# Patient Record
Sex: Female | Born: 1945 | Race: White | Hispanic: No | Marital: Married | Smoking: Former smoker
Health system: Southern US, Community
[De-identification: ages and names within clinical notes are randomized; demographics above are authoritative.]

## PROBLEM LIST (undated history)

## (undated) DIAGNOSIS — N6459 Other signs and symptoms in breast: Secondary | ICD-10-CM

## (undated) DIAGNOSIS — I10 Essential (primary) hypertension: Secondary | ICD-10-CM

## (undated) DIAGNOSIS — C801 Malignant (primary) neoplasm, unspecified: Secondary | ICD-10-CM

## (undated) DIAGNOSIS — K219 Gastro-esophageal reflux disease without esophagitis: Secondary | ICD-10-CM

## (undated) DIAGNOSIS — T7840XA Allergy, unspecified, initial encounter: Secondary | ICD-10-CM

## (undated) DIAGNOSIS — E785 Hyperlipidemia, unspecified: Secondary | ICD-10-CM

## (undated) HISTORY — PX: COSMETIC SURGERY: SHX468

## (undated) HISTORY — PX: FOOT SURGERY: SHX648

## (undated) HISTORY — DX: Allergy, unspecified, initial encounter: T78.40XA

## (undated) HISTORY — DX: Hyperlipidemia, unspecified: E78.5

## (undated) HISTORY — DX: Gastro-esophageal reflux disease without esophagitis: K21.9

## (undated) HISTORY — DX: Other signs and symptoms in breast: N64.59

---

## 1987-12-05 HISTORY — PX: BREAST BIOPSY: SHX20

## 1989-12-04 HISTORY — PX: CEREBRAL ANEURYSM REPAIR: SHX164

## 1991-12-05 HISTORY — PX: BREAST BIOPSY: SHX20

## 2003-11-10 ENCOUNTER — Other Ambulatory Visit: Payer: Self-pay

## 2003-12-11 LAB — HM COLONOSCOPY: HM Colonoscopy: NORMAL

## 2004-04-05 ENCOUNTER — Encounter (INDEPENDENT_AMBULATORY_CARE_PROVIDER_SITE_OTHER): Payer: Self-pay | Admitting: Specialist

## 2004-04-05 ENCOUNTER — Ambulatory Visit (HOSPITAL_COMMUNITY): Admission: RE | Admit: 2004-04-05 | Discharge: 2004-04-05 | Payer: Self-pay | Admitting: Gastroenterology

## 2004-08-05 ENCOUNTER — Encounter: Admission: RE | Admit: 2004-08-05 | Discharge: 2004-08-05 | Payer: Self-pay | Admitting: Gastroenterology

## 2004-10-07 ENCOUNTER — Encounter (INDEPENDENT_AMBULATORY_CARE_PROVIDER_SITE_OTHER): Payer: Self-pay | Admitting: Specialist

## 2004-10-07 ENCOUNTER — Ambulatory Visit (HOSPITAL_COMMUNITY): Admission: RE | Admit: 2004-10-07 | Discharge: 2004-10-07 | Payer: Self-pay | Admitting: Gastroenterology

## 2004-12-04 HISTORY — PX: HAMMER TOE SURGERY: SHX385

## 2004-12-04 HISTORY — PX: UPPER GI ENDOSCOPY: SHX6162

## 2005-04-20 ENCOUNTER — Ambulatory Visit: Payer: Self-pay | Admitting: General Surgery

## 2006-05-08 ENCOUNTER — Ambulatory Visit: Payer: Self-pay | Admitting: General Surgery

## 2007-05-09 ENCOUNTER — Ambulatory Visit: Payer: Self-pay | Admitting: General Surgery

## 2008-05-12 ENCOUNTER — Ambulatory Visit: Payer: Self-pay | Admitting: General Surgery

## 2009-05-25 ENCOUNTER — Ambulatory Visit: Payer: Self-pay | Admitting: General Surgery

## 2009-11-18 ENCOUNTER — Ambulatory Visit: Payer: Self-pay | Admitting: General Practice

## 2010-07-12 ENCOUNTER — Ambulatory Visit: Payer: Self-pay | Admitting: General Surgery

## 2011-04-21 NOTE — Op Note (Signed)
NAMELAMYIA, Michele Fernandez                        ACCOUNT NO.:  [id number]   MEDICAL RECORD NO.:  [id number]                   PATIENT TYPE:  AMB   LOCATION:  ENDO                                 FACILITY:  [facility]   PHYSICIAN:  Petra Kuba, M.D.                 DATE OF BIRTH:  1945-12-23   DATE OF PROCEDURE:  DATE OF DISCHARGE:                                 OPERATIVE REPORT   PROCEDURE:  Esophagogastroduodenoscopy with biopsy.   INDICATIONS:  Atypical reflux.   Consent was signed after risks, benefits, methods, options thoroughly  discussed in the office.   MEDICINES USED:  Demerol 40, Versed 5.   PROCEDURE:  The video endoscope was inserted by direct vision.  The  esophagus was normal.  Maybe there was a tiny hiatal hernia.  The scope was  passed into the stomach and advanced through the antrum.  Pertinent for some  very minimal amount of antritis.  Through a normal pylorus into a normal  duodenal bulb and around the C loop to a normal second portion of the  duodenum.  The scope was withdrawn back into the bulb and a good look there  ruled out abnormalities in that location.  The scope was withdrawn back to  the stomach and retroflexed.  Angularis, cardia, and fundus were normal.  The lesser and greater curve were normal and retroflexed in a straight  visualization to the stomach did not reveal any additional findings.  We  went ahead and took one CLO biopsy of the antrum to rule out Helicobacter.  Air was suctioned.  The scope was slowly withdrawn.  Again, a good look at  the esophagus was normal.  We did go ahead and take a few distal esophageal  biopsies to rule out any microscopic reflux changes.  The scope was removed.  The patient tolerated the procedure well.  There was no obvious immediate  complications.   DIAGNOSIS:  1. Questionable tiny hiatal hernia, status post distal esophageal biopsy to     rule out microscopic reflux.  2. Minimal antritis, status post CLO  biopsy.  3. Otherwise normal esophagogastroduodenoscopy.   PLAN:  Will await pathology.  A trial of Nexium, since Protonix seemed to  make her worse.  Consider an ultrasound if her midepigastric pain continues.  Follow up p.r.n. or in 2-3 months to recheck symptoms and make sure no  further workup plans are needed.                                               Petra Kuba, M.D.    MEM/MEDQ  D:  04/05/2004  T:  04/05/2004  Job:  [id number]

## 2011-04-21 NOTE — Op Note (Signed)
NAMEMONTSERRATH, Michele Fernandez            ACCOUNT NO.:  [id number]   MEDICAL RECORD NO.:  [id number]          PATIENT TYPE:  AMB   LOCATION:  ENDO                         FACILITY:  [facility]   PHYSICIAN:  Petra Kuba, M.D.    DATE OF BIRTH:  December 14, 1945   DATE OF PROCEDURE:  10/07/2004  DATE OF DISCHARGE:                                 OPERATIVE REPORT   PROCEDURE:  Colonoscopy.   INDICATIONS FOR PROCEDURE:  Screening.  Consent was signed after risks,  benefits, methods, and options were thoroughly discussed in the office.   MEDICATIONS USED:  Demerol 70, Versed 8.   PROCEDURE:  Rectal inspection was pertinent for external hemorrhoids.  Digital exam was negative.  The video pediatric adjustable colonoscope was  inserted and with minimal difficulty due to some tortuosity, with abdominal  pressure, was able to advance to the cecum.  No abnormalities were seen on  insertion.  We did have to roll her on her back, as well.  The cecum was  identified by the appendiceal orifice and the ileocecal valve.  The prep was  adequate, there was some liquid stool that required washing and suctioning.  On slow withdrawal through the colon, the cecum, ascending, transverse, and  descending were normal.  In the distal sigmoid, a questionable tiny polyp  was seen which was cold biopsied x 2.  The scope was further withdrawn, no  additional findings were seen.  Anorectal pull through and retroflexion  confirmed some small hemorrhoids.  The scope was straightened and readvanced  a short ways up the left side of the colon, air was suctioned, the scope was  removed.  The patient tolerated the procedure well.  There was no obvious  immediate complications.   ENDOSCOPIC DIAGNOSIS:  1.  Internal and external small hemorrhoids.  2.  Questionable tiny sigmoid polyp, cold biopsied.  3.  Otherwise, within normal limits to the cecum.   PLAN:  Await pathology, probably recheck colon screening in five years.  Happy to  see back p.r.n.       MEM/MEDQ  D:  10/07/2004  T:  10/07/2004  Job:  [id number]

## 2011-07-18 ENCOUNTER — Ambulatory Visit: Payer: Self-pay | Admitting: General Surgery

## 2012-02-08 LAB — FECAL OCCULT BLOOD, GUAIAC: Fecal Occult Blood: NEGATIVE

## 2012-07-23 ENCOUNTER — Ambulatory Visit: Payer: Self-pay | Admitting: General Surgery

## 2012-12-04 HISTORY — PX: COLONOSCOPY: SHX174

## 2012-12-10 ENCOUNTER — Encounter: Payer: Self-pay | Admitting: Internal Medicine

## 2012-12-10 ENCOUNTER — Ambulatory Visit (INDEPENDENT_AMBULATORY_CARE_PROVIDER_SITE_OTHER): Payer: No Typology Code available for payment source | Admitting: Internal Medicine

## 2012-12-10 VITALS — BP 140/80 | HR 78 | Temp 98.7°F | Resp 16 | Ht 62.0 in | Wt 161.5 lb

## 2012-12-10 DIAGNOSIS — I10 Essential (primary) hypertension: Secondary | ICD-10-CM | POA: Insufficient documentation

## 2012-12-10 DIAGNOSIS — M810 Age-related osteoporosis without current pathological fracture: Secondary | ICD-10-CM | POA: Insufficient documentation

## 2012-12-10 DIAGNOSIS — M949 Disorder of cartilage, unspecified: Secondary | ICD-10-CM

## 2012-12-10 DIAGNOSIS — M899 Disorder of bone, unspecified: Secondary | ICD-10-CM

## 2012-12-10 DIAGNOSIS — M8580 Other specified disorders of bone density and structure, unspecified site: Secondary | ICD-10-CM

## 2012-12-10 NOTE — Assessment & Plan Note (Addendum)
Well-controlled on hydrochlorothiazide. [place]

## 2012-12-10 NOTE — Patient Instructions (Addendum)
This is  my version of a  "Low GI"  Diet:  All of the foods can be found at grocery stores and in bulk at [employer].  The Atkins protein bars and shakes are available in more varieties at [employer], [employer] and [employer].     7 AM Breakfast:  Low carbohydrate Protein  Shakes (I recommend the EAS AdvantEdge "Carb Control" shakes  Or the low carb shakes by Atkins.   Both are available everywhere:  In  cases at [employer]Js  Or in 4 packs at grocery stores and pharmacies  2.5 carbs  (Alternative is  a toasted Arnold's Sandwhich Thin w/ peanut butter, a "Bagel Thin" with cream cheese and salmon) or  a scrambled egg burrito made with a low carb tortilla .  Avoid cereal and bananas, oatmeal too unless you are cooking the old fashioned kind that takes 30-40 minutes to prepare.  the rest is overly processed, has minimal fiber, and is loaded with carbohydrates!   10 AM: Protein bar by Atkins (the snack size, under 200 cal).  There are many varieties , available widely again or in bulk in limited varieties at BJs)  Other so called "protein bars" tend to be loaded with carbohydrates.  Remember, in food advertising, the word "energy" is synonymous for " carbohydrate."  Lunch: sandwich of turkey, (or any lunchmeat, grilled meat or canned tuna), fresh avocado, mayonnaise  and cheese on a lower carbohydrate pita bread, flatbread, or tortilla . Ok to use regular mayonnaise. The bread is the only source or carbohydrate that can be decreased (Joseph's makes a pita bread and a flat bread that are 50 cal and 4 net carbs ; Toufayan makes a low carb flatbread that's 100 cal and 9 net carbs  and  Mission makes a low carb whole wheat tortilla  That is 210 cal and 6 net carbs)  3 PM:  Mid day :  Another protein bar,  Or a  cheese stick (100 cal, 0 carbs),  Or 1 ounce of  almonds, walnuts, pistachios, pecans, peanuts,  Macadamia nuts. Or a Dannon light n Fit greek yogurt, 80 cal 8 net carbs . Avoid "granola"; the dried cranberries and  raisins are loaded with carbohydrates. Mixed nuts ok if no raisins or cranberries or dried fruit.      6 PM  Dinner:  "mean and green:"  Meat/chicken/fish or a high protein legume; , with a green salad, and a low GI  Veggie (broccoli, cauliflower, green beans, spinach, brussel sprouts. Lima beans) : Avoid "Low fat dressings, as well as [place] and [place]! They are loaded with sugar! Instead use ranch, vinagrette,  Blue cheese, etc.  There is a low carb pasta by Dreamfield's available at [employer] that is acceptable and tastes great. Try Michel Angel's chicken piccata over low carb pasta. The chicken dish is 0 carbs, and can be found in frozen section at [employer]Js and [employer]. Also try Aaron Sanchez's "Carnitas" (pulled pork, no sauce,  0 carbs) and his pot roast.   both are in the refrigerated section at BJs   9 PM snack : Breyer's "low carb" fudgsicle or  ice cream bar (Carb Smart line), or  Weight Watcher's ice cream bar , or another "no sugar added" ice cream;a serving of fresh berries/cherries with whipped cream (Avoid bananas, pineapple, grapes  and watermelon on a regular basis because they are high in sugar)   Remember that snack Substitutions should be less than 15 to   20 carbs  Per serving. Remember to subtract fiber grams and sugar alcohols to get the "net carbs."   

## 2012-12-10 NOTE — Assessment & Plan Note (Addendum)
She has history of year use of bisphosphonates originally alendronate followed by Boniva. Repeat DEXA scan has been done in the last several years but is not currently available. She'll try get these from [facility] clinic and we will review them. We'll check a vitamin D level and thyroid with her next lab draw. Continue calcium 11-1799 mg daily vitamin D 1000 units daily pending repeat vitamin D testing. Continue weightbearing exercise.

## 2012-12-10 NOTE — Progress Notes (Signed)
Patient ID: Michele Fernandez, female   DOB: 1946/02/11, 67 y.o.   MRN: [id number]   Patient Active Problem List  Diagnosis  . Hypertension  . Osteopenia    Subjective:  CC:   Chief Complaint  Patient presents with  . Establish Care    HPI:   Michele Fernandez is a 67 y.o. female who presents as a new patient to establish primary care with the chief complaint of  Primary care.  1) She is recovering from a viral URI, Day 6 of a cold .  Throat dry,  Had Tmax 101. 4   This morning 99.8   No body aches.   No nocturnal cough.  Some ear discomfort a few days ago, treated with atihistamine and tylenol.  2) HTN: she has been taking HCTZ daily since September with little improvement in her blood pressure. 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 She denies palpitations r Dena chest pain or shortness of breath. She exercises regularly. 3)  osteopenia. She has been on bisphosphonate therapy for 9 years and quit in 2010. She has had serial bone density test but has been unable to find the reports. They were supposedly done by her prior PCP at [facility], Dr. Terance Hart .  3) history of breast mass, benign. She has annual mammogram followup with Dr. Doristine Fernandez who has done biopsies on her in the past which were benign.    Past Medical History  Diagnosis Date  . GERD (gastroesophageal reflux disease)     Past Surgical History  Procedure Date  . Breast biopsy 1993  . Breast biopsy 1995    Family History  Problem Relation Age of Onset  . Alcohol abuse Son   . Cancer Maternal Aunt     ovarian  . Heart attack Maternal Grandmother     History   Social History  . Marital Status: Married    Spouse Name: N/A    Number of Children: N/A  . Years of Education: N/A   Occupational History  . Not on file.   Social History Main Topics  . Smoking status: Former Smoker    Quit date: 12/10/1972  .  Smokeless tobacco: Not on file  . Alcohol Use: Yes  . Drug Use: No  . Sexually Active:    Other Topics Concern  . Not on file   Social History Narrative  . No narrative on file    Allergies  Allergen Reactions  . Biaxin (Clarithromycin) Nausea Only    Metallic taste  . Demerol (Meperidine) Nausea And Vomiting    IV Demerol Only    Review of Systems:   Patient denies headache, fevers, malaise, unintentional weight loss, skin rash, eye pain, sinus congestion and sinus pain, sore throat, dysphagia,  hemoptysis , cough, dyspnea, wheezing, chest pain, palpitations, orthopnea, edema, abdominal pain, nausea, melena, diarrhea, constipation, flank pain, dysuria, hematuria, urinary  Frequency, nocturia, numbness, tingling, seizures,  Focal weakness, Loss of consciousness,  Tremor, insomnia, depression, anxiety, and suicidal ideation.       Objective:  BP 140/80  Pulse 78  Temp 98.7 F (37.1 C) (Oral)  Resp 16  Ht 5\' 2"  (1.575 m)  Wt 161 lb 8 oz (73.256 kg)  BMI 29.54 kg/m2  General appearance: alert, cooperative and appears stated age Ears: normal TM's and external ear canals both ears Throat: lips, mucosa, and tongue normal; teeth and  gums normal Neck: no adenopathy, no carotid bruit, supple, symmetrical, trachea midline and thyroid not enlarged, symmetric, no tenderness/mass/nodules Back: symmetric, no curvature. ROM normal. No CVA tenderness. Lungs: clear to auscultation bilaterally Heart: regular rate and rhythm, S1, S2 normal, no murmur, click, rub or gallop Abdomen: soft, non-tender; bowel sounds normal; no masses,  no organomegaly Pulses: 2+ and symmetric Skin: Skin color, texture, turgor normal. No rashes or lesions Lymph nodes: Cervical, supraclavicular, and axillary nodes normal.  Assessment and Plan:  Hypertension Well-controlled on hydrochlorothiazide. She will have electrolytes and [place] function [place].  Osteopenia She has  history of year use of bisphosphonates originally alendronate followed by Boniva. Repeat DEXA scan has been done in the last several years but is not currently available. She'll try get these from coronal clinic and we will review them. We'll check a vitamin D level and thyroid with her next lab draw. Continue calcium 11-1799 mg daily vitamin D 1000 units daily pending repeat vitamin D testing. Continue weightbearing exercise.   Updated Medication List Outpatient Encounter Prescriptions as of 12/10/2012  Medication Sig Dispense Refill  . butalbital-acetaminophen-caffeine (ESGIC) 50-325-40 MG per tablet Take 1 tablet by mouth daily as needed.      . Calcium-Magnesium-Vitamin D (CITRACAL SLOW RELEASE PO) Take by mouth.      . cholecalciferol (VITAMIN D) 1000 UNITS tablet Take 1,000 Units by mouth daily.      . hydrochlorothiazide (HYDRODIURIL) 25 MG tablet Take 25 mg by mouth daily.      . magnesium oxide (MAG-OX) 400 MG tablet Take 400 mg by mouth daily as needed.      . Multiple Vitamins-Minerals (CENTRUM PO) Take by mouth daily.         Orders Placed This Encounter  Procedures  . HM MAMMOGRAPHY  . HM PAP SMEAR  . Fecal Occult Blood, Guaiac  . HM COLONOSCOPY    No Follow-up on file.

## 2012-12-17 ENCOUNTER — Telehealth: Payer: Self-pay | Admitting: Internal Medicine

## 2012-12-17 NOTE — Telephone Encounter (Signed)
Not yet,  It has not been scanned into chart but once it has been I will decide when she needs another one.

## 2012-12-17 NOTE — Telephone Encounter (Signed)
LMOVM for pt to return call 

## 2012-12-17 NOTE — Telephone Encounter (Signed)
Pt notified. Also wants to know if Bone Density needs to be ordered. Pt left old one with Robin on Friday of last week.

## 2012-12-17 NOTE — Telephone Encounter (Signed)
All labs normal,. Including thyroid function and cholesterol   

## 2013-01-18 ENCOUNTER — Other Ambulatory Visit: Payer: Self-pay

## 2013-02-19 ENCOUNTER — Ambulatory Visit: Payer: Self-pay | Admitting: General Practice

## 2013-03-26 ENCOUNTER — Encounter: Payer: Self-pay | Admitting: Internal Medicine

## 2013-03-26 DIAGNOSIS — M8580 Other specified disorders of bone density and structure, unspecified site: Secondary | ICD-10-CM

## 2013-04-02 ENCOUNTER — Encounter: Payer: Self-pay | Admitting: Internal Medicine

## 2013-04-15 ENCOUNTER — Encounter: Payer: Self-pay | Admitting: Internal Medicine

## 2013-05-05 ENCOUNTER — Encounter: Payer: Self-pay | Admitting: Emergency Medicine

## 2013-05-26 LAB — HM DEXA SCAN

## 2013-06-09 ENCOUNTER — Encounter: Payer: Self-pay | Admitting: Internal Medicine

## 2013-06-11 ENCOUNTER — Encounter: Payer: Self-pay | Admitting: Internal Medicine

## 2013-07-29 ENCOUNTER — Ambulatory Visit: Payer: Self-pay | Admitting: General Surgery

## 2013-08-05 ENCOUNTER — Encounter: Payer: Self-pay | Admitting: Internal Medicine

## 2013-08-05 DIAGNOSIS — IMO0001 Reserved for inherently not codable concepts without codable children: Secondary | ICD-10-CM

## 2013-08-05 DIAGNOSIS — Z9889 Other specified postprocedural states: Secondary | ICD-10-CM | POA: Insufficient documentation

## 2013-08-06 ENCOUNTER — Ambulatory Visit (INDEPENDENT_AMBULATORY_CARE_PROVIDER_SITE_OTHER): Payer: No Typology Code available for payment source | Admitting: General Surgery

## 2013-08-06 ENCOUNTER — Encounter: Payer: Self-pay | Admitting: General Surgery

## 2013-08-06 ENCOUNTER — Other Ambulatory Visit: Payer: No Typology Code available for payment source

## 2013-08-06 VITALS — BP 174/84 | HR 84 | Resp 12 | Ht 61.0 in | Wt 160.0 lb

## 2013-08-06 DIAGNOSIS — R928 Other abnormal and inconclusive findings on diagnostic imaging of breast: Secondary | ICD-10-CM

## 2013-08-06 DIAGNOSIS — N63 Unspecified lump in unspecified breast: Secondary | ICD-10-CM

## 2013-08-06 NOTE — Progress Notes (Signed)
Patient ID: [name], female   DOB: 1946/04/20, 67 y.o.   MRN: [id number]  Chief Complaint  Patient presents with  . Follow-up    1 year mammogram    HPI Michele Fernandez is a 67 y.o. female.  who presents for her annual breast evaluation and follow up mammogram. The most recent mammogram was done on .  Patient does perform regular self breast checks and gets regular mammograms done.   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 In 1993 she underwent biopsy to fibroadenomas from the left breast and 3 from the right breast.  HPI  Past Medical History  Diagnosis Date  . GERD (gastroesophageal reflux disease)   . Other sign and symptom in breast     Past Surgical History  Procedure Laterality Date  . Breast biopsy Bilateral 1993  . Breast biopsy Left 1989  . Upper gi endoscopy  2006  . Hammer toe surgery  2006  . Foot surgery  2009, 2012  . Cerebral aneurysm repair  1991    Family History  Problem Relation Age of Onset  . Alcohol abuse Son   . Cancer Maternal Aunt     ovarian  . Heart attack Maternal Grandmother     Social History History  Substance Use Topics  . Smoking status: Former Smoker -- 1.00 packs/day for 15 years    Types: Cigarettes    Quit date: 12/10/1972  . Smokeless tobacco: Never Used  . Alcohol Use: No    Allergies  Allergen Reactions  . Biaxin [Clarithromycin] Nausea Only    Metallic taste  . Demerol [Meperidine] Nausea And Vomiting    IV Demerol Only    Current Outpatient Prescriptions  Medication Sig Dispense Refill  . butalbital-acetaminophen-caffeine (ESGIC) 50-325-40 MG per tablet Take 1 tablet by mouth daily as needed.      . Calcium-Magnesium-Vitamin D (CITRACAL SLOW RELEASE PO) Take by mouth.      . cholecalciferol (VITAMIN D) 1000 UNITS tablet Take 1,000 Units by  mouth daily.      . hydrochlorothiazide (HYDRODIURIL) 25 MG tablet Take 25 mg by mouth daily.      . magnesium oxide (MAG-OX) 400 MG tablet Take 400 mg by mouth daily as needed.      . Multiple Vitamins-Minerals (CENTRUM PO) Take by mouth daily.       No current facility-administered medications for this visit.    Review of Systems Review of Systems  Blood pressure 174/84, pulse 84, resp. rate 12, height 5\' 1"  (1.549 m), weight 160 lb (72.576 kg).  Physical Exam Physical Exam  Constitutional: She is oriented to person, place, and time. She appears well-developed and well-nourished.  Cardiovascular: Normal rate, regular rhythm and normal heart sounds.   Pulmonary/Chest: Breath sounds normal. Right breast exhibits no inverted nipple, no mass, no nipple discharge, no skin change and no tenderness. Left breast exhibits no inverted nipple, no mass, no nipple discharge, no skin change and no tenderness.  Right breast three well healed scars and the left breast has one well healed scar.   Lymphadenopathy:    She has no cervical adenopathy.    She has no axillary adenopathy.  Neurological: She is alert and oriented to person, place, and time.  Skin: Skin is warm and dry.    Data Reviewed Bilateral screening mammograms obtained 07/29/2013 reportedly showed no interval change.  BI-RAD-1.  On my review the films there was an area of increasing density in the upper inner quadrant of the left breast. This is a site of previous anesthesia had been more amorphous in the past. This is likely a degenerating cyst or fibroadenoma. Ultrasound examination of the left breast in the upper-inner quadrant showed a hypoechoic nodule with acoustic shadowing but no internal vascular flow at the 11:00 position, 5 cm from the nipple. This measured 0.5 x 0.1 x 0.81 cm. This likely correlates to the more prominent density identified on her recent mammogram.  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    Plan    Will range for a followup office exam and ultrasound 6 months.       Michele Fernandez 08/08/2013, 7:42 AM

## 2013-08-06 NOTE — Patient Instructions (Addendum)
Patient to return in six months  

## 2013-08-08 ENCOUNTER — Encounter: Payer: Self-pay | Admitting: General Surgery

## 2013-09-08 ENCOUNTER — Other Ambulatory Visit: Payer: Self-pay | Admitting: Gastroenterology

## 2013-09-20 ENCOUNTER — Other Ambulatory Visit: Payer: Self-pay | Admitting: Internal Medicine

## 2013-09-24 ENCOUNTER — Encounter: Payer: Self-pay | Admitting: Internal Medicine

## 2013-09-24 DIAGNOSIS — Z79899 Other long term (current) drug therapy: Secondary | ICD-10-CM

## 2013-09-25 NOTE — Telephone Encounter (Signed)
Her last office visit was 12/20/12, refill her HCTZ? Pt sent myChart message requesting refill

## 2013-09-26 MED ORDER — HYDROCHLOROTHIAZIDE 25 MG PO TABS: 25.0000 mg | ORAL_TABLET | Freq: Every day | ORAL | Status: DC

## 2013-10-06 ENCOUNTER — Ambulatory Visit (INDEPENDENT_AMBULATORY_CARE_PROVIDER_SITE_OTHER): Payer: Medicare Other | Admitting: Internal Medicine

## 2013-10-06 ENCOUNTER — Encounter: Payer: Self-pay | Admitting: Internal Medicine

## 2013-10-06 VITALS — BP 168/88 | HR 83 | Temp 98.2°F | Resp 14 | Ht 61.0 in | Wt 159.5 lb

## 2013-10-06 DIAGNOSIS — I10 Essential (primary) hypertension: Secondary | ICD-10-CM

## 2013-10-06 DIAGNOSIS — M5431 Sciatica, right side: Secondary | ICD-10-CM | POA: Insufficient documentation

## 2013-10-06 DIAGNOSIS — Z23 Encounter for immunization: Secondary | ICD-10-CM

## 2013-10-06 DIAGNOSIS — R5381 Other malaise: Secondary | ICD-10-CM

## 2013-10-06 DIAGNOSIS — M549 Dorsalgia, unspecified: Secondary | ICD-10-CM

## 2013-10-06 DIAGNOSIS — Z79899 Other long term (current) drug therapy: Secondary | ICD-10-CM

## 2013-10-06 DIAGNOSIS — M810 Age-related osteoporosis without current pathological fracture: Secondary | ICD-10-CM

## 2013-10-06 DIAGNOSIS — E559 Vitamin D deficiency, unspecified: Secondary | ICD-10-CM

## 2013-10-06 DIAGNOSIS — E785 Hyperlipidemia, unspecified: Secondary | ICD-10-CM

## 2013-10-06 DIAGNOSIS — Z9889 Other specified postprocedural states: Secondary | ICD-10-CM

## 2013-10-06 DIAGNOSIS — R5383 Other fatigue: Secondary | ICD-10-CM

## 2013-10-06 MED ORDER — TRAMADOL HCL 50 MG PO TABS: 50.0000 mg | ORAL_TABLET | Freq: Three times a day (TID) | ORAL | Status: DC | PRN

## 2013-10-06 MED ORDER — CYCLOBENZAPRINE HCL 5 MG PO TABS: 5.0000 mg | ORAL_TABLET | Freq: Three times a day (TID) | ORAL | Status: DC | PRN

## 2013-10-06 NOTE — Assessment & Plan Note (Signed)
T scores improving by last DEXA but still -2.5 lumbar spine. No vertebral tenderness

## 2013-10-06 NOTE — Assessment & Plan Note (Signed)
Elevated today.  Reviewed list of meds, patient is  Taking ibuprofen for back pain . Home readinds priro to last 5 days have been normal  . cotninue hctz,  Needs labs.

## 2013-10-06 NOTE — Progress Notes (Signed)
Patient ID: Michele Fernandez, female   DOB: 1946-11-16, 67 y.o.   MRN: [id number]   Patient Active Problem List   Diagnosis Date Noted  . Acute back pain less than 4 weeks duration 10/06/2013  . Lump or mass in breast 08/06/2013  . History of breast lump/mass excision 08/05/2013  . ASCUS (atypical squamous cells of undetermined significance) on Pap smear 08/05/2013  . Hypertension 12/10/2012  . Osteoporosis 12/10/2012    Subjective:  CC:   Chief Complaint  Patient presents with  . Follow-up    blood pressure    HPI:   Michele Fernandez a 67 y.o. female who presents Follow up on Hypertension,  Home readings   Taken daily have been consistently < 130/80.    Taking hctz daily without leg cramps  Recent nontraumatic  back injury.  Persistent Back pain since she bent over to tie her shoes and felt a Pulled muscle last Friday.  Improved with ibuprofen but then aggravated by long car trip.  Not radiating .  Has had prior episodes on the same side sacro iliac .  Has suspended her Pilates/yoga exercises but still walking 3.5 miles  M [facility] . Requesting flexeril and tylenol   Had colonoscopy last month by Dr. Ewing Schlein due to change in bowel habits.  Benign sigmoid polyp found.  Breast exams donne annually by Adela Glimpse, done in September,  Some thickening noted in right breast images which may be scar tissue but 6 month follow up planned.    Past Medical History  Diagnosis Date  . GERD (gastroesophageal reflux disease)   . Other sign and symptom in breast     Past Surgical History  Procedure Laterality Date  . Breast biopsy Bilateral 1993  . Breast biopsy Left 1989  . Upper gi endoscopy  2006  . Hammer toe surgery  2006  . Foot surgery  2009, 2012  . Cerebral aneurysm repair  1991       The following portions of the patient's history were reviewed and updated as appropriate: Allergies, current medications, and problem list.    Review of Systems:   12 Pt  review of  systems was negative except those addressed in the HPI,     History   Social History  . Marital Status: Married    Spouse Name: N/A    Number of Children: N/A  . Years of Education: N/A   Occupational History  . Not on file.   Social History Main Topics  . Smoking status: Former Smoker -- 1.00 packs/day for 15 years    Types: Cigarettes    Quit date: 12/10/1972  . Smokeless tobacco: Never Used  . Alcohol Use: No  . Drug Use: No  . Sexual Activity: Not on file   Other Topics Concern  . Not on file   Social History Narrative  . No narrative on file    Objective:  Filed Vitals:   10/06/13 1140  BP: 168/88  Pulse: 83  Temp: 98.2 F (36.8 C)  Resp: 14     General appearance: alert, cooperative and appears stated age Neck: no adenopathy, no carotid bruit, supple, symmetrical, trachea midline and thyroid not enlarged, symmetric, no tenderness/mass/nodules Back: symmetric, no curvature. ROM normal. No CVA tenderness. Lungs: clear to auscultation bilaterally Heart: regular rate and rhythm, S1, S2 normal, no murmur, click, rub or gallop Abdomen: soft, non-tender; bowel sounds normal; no masses,  no organomegaly Pulses: 2+ and symmetric Skin: Skin color, texture, turgor  normal. No rashes or lesions Lymph nodes: Cervical, supraclavicular, and axillary nodes normal. Neuro: DTRs 2+/4 in biceps, brachioradialis, patellars and achilles. Muscle strength 5/5 in upper and lower exremities. Fine resting tremor bilaterally both hands cerebellar function normal. Negative straight leg lift.  Gait normal.   Assessment and Plan:  Acute back pain less than 4 weeks duration Exam is not consistent with  Sciatica.  She has no vertebral tenderness  To suggest oseoporotic fracture. Using heat and ice .  Requesting flexeril.  Avoid NSAIDs due to elevation in bp.  Tyelnol,  Tramadol prn     History of breast lump/mass excision Follow up mammogram in March  planned  Hypertension Elevated today.  Reviewed list of meds, patient is  Taking ibuprofen for back pain . Home readinds priro to last 5 days have been normal  . cotninue hctz,  Needs labs.   Osteoporosis T scores improving by last DEXA but still -2.5 lumbar spine. No vertebral tenderness     Updated Medication List Outpatient Encounter Prescriptions as of 10/06/2013  Medication Sig  . Calcium Carb-Cholecalciferol (HM CALCIUM-VITAMIN D) 600-400 MG-UNIT TABS Take 1 tablet by mouth daily.  . hydrochlorothiazide (HYDRODIURIL) 25 MG tablet Take 1 tablet (25 mg total) by mouth daily.  . magnesium oxide (MAG-OX) 400 MG tablet Take 400 mg by mouth daily as needed.  . Multiple Vitamins-Minerals (CENTRUM PO) Take by mouth daily.  . Multiple Vitamins-Minerals (IPRIFLAVONE OSTEO FORMULA PO) Take 1 tablet by mouth daily.  . butalbital-acetaminophen-caffeine (ESGIC) 50-325-40 MG per tablet Take 1 tablet by mouth daily as needed.  . cyclobenzaprine (FLEXERIL) 5 MG tablet Take 1 tablet (5 mg total) by mouth 3 (three) times daily as needed for muscle spasms.  . traMADol (ULTRAM) 50 MG tablet Take 1 tablet (50 mg total) by mouth every 8 (eight) hours as needed for pain.  . [DISCONTINUED] Calcium-Magnesium-Vitamin D (CITRACAL SLOW RELEASE PO) Take by mouth.  . [DISCONTINUED] cholecalciferol (VITAMIN D) 1000 UNITS tablet Take 1,000 Units by mouth daily.     Orders Placed This Encounter  Procedures  . CBC with Differential  . Comprehensive metabolic panel  . TSH  . Lipid panel  . Vit D  25 hydroxy (rtn osteoporosis monitoring)    No Follow-up on file.

## 2013-10-06 NOTE — Assessment & Plan Note (Addendum)
Exam is not consistent with  Sciatica.  She has no vertebral tenderness  To suggest oseoporotic fracture. Using heat and ice .  Requesting flexeril.  Avoid NSAIDs due to elevation in bp.  Tyelnol,  Tramadol prn

## 2013-10-06 NOTE — Assessment & Plan Note (Signed)
Follow up mammogram in March planned

## 2013-10-06 NOTE — Patient Instructions (Signed)
I am prescribing flexeril and tylenol vs tramadol (for severe pain ) for your back pain  Return for fasting labs ASAP 9nothing but black coffee or wter for 8 hours prior)  continue HCTZ for now

## 2013-10-10 ENCOUNTER — Other Ambulatory Visit (INDEPENDENT_AMBULATORY_CARE_PROVIDER_SITE_OTHER): Payer: Medicare Other

## 2013-10-10 DIAGNOSIS — Z79899 Other long term (current) drug therapy: Secondary | ICD-10-CM

## 2013-10-10 DIAGNOSIS — E559 Vitamin D deficiency, unspecified: Secondary | ICD-10-CM

## 2013-10-10 DIAGNOSIS — R5381 Other malaise: Secondary | ICD-10-CM

## 2013-10-10 DIAGNOSIS — E785 Hyperlipidemia, unspecified: Secondary | ICD-10-CM

## 2013-10-10 LAB — COMPREHENSIVE METABOLIC PANEL
ALT: 29 U/L (ref 0–35)
Albumin: 3.9 g/dL (ref 3.5–5.2)
BUN: 13 mg/dL (ref 6–23)
CO2: 28 mEq/L (ref 19–32)
Creatinine, Ser: 0.8 mg/dL (ref 0.4–1.2)
Glucose, Bld: 82 mg/dL (ref 70–99)
Sodium: 140 mEq/L (ref 135–145)
Total Bilirubin: 0.9 mg/dL (ref 0.3–1.2)

## 2013-10-10 LAB — CBC WITH DIFFERENTIAL/PLATELET
Basophils Absolute: 0 10*3/uL (ref 0.0–0.1)
Hemoglobin: 14.1 g/dL (ref 12.0–15.0)
Lymphs Abs: 1.7 10*3/uL (ref 0.7–4.0)
MCV: 85.8 fl (ref 78.0–100.0)
Neutrophils Relative %: 58.9 % (ref 43.0–77.0)
Platelets: 278 10*3/uL (ref 150.0–400.0)
RDW: 13.5 % (ref 11.5–14.6)
WBC: 5.4 10*3/uL (ref 4.5–10.5)

## 2013-10-10 LAB — BASIC METABOLIC PANEL
CO2: 28 mEq/L (ref 19–32)
Calcium: 9 mg/dL (ref 8.4–10.5)
Chloride: 106 mEq/L (ref 96–112)
GFR: 78.77 mL/min (ref >60.00)
Glucose, Bld: 82 mg/dL (ref 70–99)
Sodium: 140 mEq/L (ref 135–145)

## 2013-10-10 LAB — LIPID PANEL
Cholesterol: 215 mg/dL — ABNORMAL HIGH (ref 0–200)
HDL: 52.1 mg/dL (ref >39.00)
Triglycerides: 84 mg/dL (ref 0.0–149.0)
VLDL: 16.8 mg/dL (ref 0.0–40.0)

## 2013-10-12 ENCOUNTER — Encounter: Payer: Self-pay | Admitting: Internal Medicine

## 2013-10-16 ENCOUNTER — Telehealth: Payer: Self-pay | Admitting: *Deleted

## 2013-10-16 ENCOUNTER — Ambulatory Visit: Payer: Medicare Other | Admitting: *Deleted

## 2013-10-16 VITALS — BP 150/82 | HR 74

## 2013-10-16 DIAGNOSIS — I10 Essential (primary) hypertension: Secondary | ICD-10-CM

## 2013-10-16 NOTE — Progress Notes (Signed)
Pt presented to office to have blood pressure checked and to check accuracy of her home cuff. Pt gets lower readings at home vs here in the office. States prior to coming in, BP was 130/80 at home. Using her cuff, I got 152/80. With the office cuff, I got 150/82. Assured pt her cuff is accurate. She states she has taken her HCTZ today. Denies any changes to her medications and has not started on any new meds. Denies any acute symptoms or changes, no pain or headache. Dr. Darrick Huntsman made aware of readings.

## 2013-10-16 NOTE — Telephone Encounter (Signed)
Left message on pt's voicemail, notifying of results

## 2013-10-16 NOTE — Assessment & Plan Note (Signed)
Well controlled by home checks  ,  bp machine has been calibrated today nd is working well.  No changes today

## 2013-10-16 NOTE — Telephone Encounter (Signed)
Thank you!  i have updated her chart with the information below.  No medication changes needed as long as home bps are 130/80 ballpark

## 2013-10-16 NOTE — Progress Notes (Signed)
Patient ID: Michele Fernandez, female   DOB: [birth year], 67 y.o.   MRN: [id number]

## 2013-10-16 NOTE — Telephone Encounter (Signed)
Pt presented to office to have blood pressure checked and to check accuracy of her home cuff. Pt gets lower readings at home vs here in the office. States prior to coming in, BP was 130/80 at home. Using her cuff, I got 152/80. With the office cuff, I got 150/82. Assured pt her cuff is accurate. She states she has taken her HCTZ today. Denies any changes to her medications and has not started on any new meds. Denies any acute symptoms or changes, no pain or headache. Dr. Tullo made aware of readings. 

## 2013-11-05 ENCOUNTER — Other Ambulatory Visit: Payer: Self-pay | Admitting: Internal Medicine

## 2013-12-24 ENCOUNTER — Encounter: Payer: Self-pay | Admitting: Internal Medicine

## 2013-12-24 DIAGNOSIS — Z113 Encounter for screening for infections with a predominantly sexual mode of transmission: Secondary | ICD-10-CM

## 2013-12-27 ENCOUNTER — Encounter: Payer: Self-pay | Admitting: General Surgery

## 2014-01-05 ENCOUNTER — Other Ambulatory Visit: Payer: Medicare Other

## 2014-01-05 DIAGNOSIS — Z113 Encounter for screening for infections with a predominantly sexual mode of transmission: Secondary | ICD-10-CM

## 2014-01-06 LAB — HEPATITIS C ANTIBODY: HCV AB: NEGATIVE

## 2014-01-06 LAB — HEPATITIS B SURFACE ANTIBODY,QUALITATIVE: Hep B S Ab: POSITIVE — AB

## 2014-01-06 LAB — HEPATITIS B SURFACE ANTIGEN: HEP B S AG: NEGATIVE

## 2014-01-06 LAB — HEPATITIS B CORE ANTIBODY, IGM: HEP B C IGM: NONREACTIVE

## 2014-01-07 ENCOUNTER — Encounter: Payer: Self-pay | Admitting: Internal Medicine

## 2014-01-09 ENCOUNTER — Ambulatory Visit: Payer: Medicare Other | Admitting: Internal Medicine

## 2014-01-10 ENCOUNTER — Encounter: Payer: Self-pay | Admitting: Internal Medicine

## 2014-02-02 ENCOUNTER — Encounter: Payer: Self-pay | Admitting: General Surgery

## 2014-02-02 ENCOUNTER — Ambulatory Visit: Payer: No Typology Code available for payment source | Admitting: General Surgery

## 2014-02-02 ENCOUNTER — Ambulatory Visit (INDEPENDENT_AMBULATORY_CARE_PROVIDER_SITE_OTHER): Payer: Medicare Other | Admitting: General Surgery

## 2014-02-02 ENCOUNTER — Other Ambulatory Visit (INDEPENDENT_AMBULATORY_CARE_PROVIDER_SITE_OTHER): Payer: Medicare Other

## 2014-02-02 VITALS — BP 158/80 | HR 84 | Resp 14 | Ht 61.0 in | Wt 157.0 lb

## 2014-02-02 DIAGNOSIS — N63 Unspecified lump in unspecified breast: Secondary | ICD-10-CM

## 2014-02-02 NOTE — Patient Instructions (Addendum)
Patient to return in 6 months for bilateral screening mammogram in 6 months.

## 2014-02-02 NOTE — Progress Notes (Signed)
Patient ID: Michele Fernandez, female   DOB: 1946-03-24, 68 y.o.   MRN: [id number]  Chief Complaint  Patient presents with  . Follow-up    6 month follow up breast ultrasound    HPI Michele Fernandez is a 68 y.o. female who presents for a 6 month follow up left breast ultrasound. The patient denies any new problems with her breasts at this time.   HPI  Past Medical History  Diagnosis Date  . GERD (gastroesophageal reflux disease)   . Other sign and symptom in breast     Past Surgical History  Procedure Laterality Date  . Breast biopsy Bilateral 1993  . Breast biopsy Left 1989  . Upper gi endoscopy  2006  . Hammer toe surgery  2006  . Foot surgery  2009, 2012  . Cerebral aneurysm repair  1991    Family History  Problem Relation Age of Onset  . Alcohol abuse Son   . Cancer Maternal Aunt     ovarian  . Heart attack Maternal Grandmother     Social History History  Substance Use Topics  . Smoking status: Former Smoker -- 1.00 packs/day for 15 years    Types: Cigarettes    Quit date: 12/10/1972  . Smokeless tobacco: Never Used  . Alcohol Use: No    Allergies  Allergen Reactions  . Biaxin [Clarithromycin] Nausea Only    Metallic taste  . Demerol [Meperidine] Nausea And Vomiting    IV Demerol Only    Current Outpatient Prescriptions  Medication Sig Dispense Refill  . butalbital-acetaminophen-caffeine (ESGIC) 50-325-40 MG per tablet Take 1 tablet by mouth daily as needed.      . Calcium Carb-Cholecalciferol (HM CALCIUM-VITAMIN D) 600-400 MG-UNIT TABS Take 1 tablet by mouth daily.      . cyclobenzaprine (FLEXERIL) 5 MG tablet Take 1 tablet (5 mg total) by mouth 3 (three) times daily as needed for muscle spasms.  90 tablet  2  . hydrochlorothiazide (HYDRODIURIL) 25 MG tablet TAKE 1 TABLET EVERY DAY USUALLY IN THE MORNING  30 tablet  5  . magnesium oxide (MAG-OX) 400 MG tablet Take 400 mg by mouth daily as needed.      . Multiple Vitamins-Minerals (CENTRUM PO) Take  by mouth daily.      . Multiple Vitamins-Minerals (IPRIFLAVONE OSTEO FORMULA PO) Take 1 tablet by mouth daily.      . traMADol (ULTRAM) 50 MG tablet Take 1 tablet (50 mg total) by mouth every 8 (eight) hours as needed for pain.  60 tablet  0   No current facility-administered medications for this visit.    Review of Systems Review of Systems  Constitutional: Negative.   Respiratory: Negative.   Cardiovascular: Negative.     Blood pressure 158/80, pulse 84, resp. rate 14, height 5\' 1"  (1.549 m), weight 157 lb (71.215 kg).  Physical Exam Physical Exam  Constitutional: She is oriented to person, place, and time. She appears well-developed and well-nourished.  Neck: Neck supple. No thyromegaly present.  Cardiovascular: Normal rate, regular rhythm and normal heart sounds.   No murmur heard. Pulmonary/Chest: Effort normal and breath sounds normal. Right breast exhibits no inverted nipple, no mass, no nipple discharge, no skin change and no tenderness. Left breast exhibits no inverted nipple, no mass, no nipple discharge, no skin change and no tenderness.  Lymphadenopathy:    She has no cervical adenopathy.    She has no axillary adenopathy.  Neurological: She is alert and oriented to person, place, and  time.  Skin: Skin is warm and dry.    Data Reviewed Previous ultrasound of the left breast in the 11:00 position completed for a new density evident on mammograms at that time suggested a 0.5 x 0.8 x 0.8 cm hypoechoic area with smooth borders.  Ultrasound examination of the upper inner quadrant of the left breast completed today shows a 0.4 x 0.4 x 0.75 cm area with fairly smooth borders.. Faint acoustic shadowing. Decreased volume and size.  Assessment    Stable nodule left breast    Plan    Arrangements were made for bilateral screening mammograms in August 2015 with an office visit to follow.       Robert Bellow 02/03/2014, 3:49 PM

## 2014-03-13 ENCOUNTER — Telehealth: Payer: Self-pay | Admitting: Internal Medicine

## 2014-03-13 NOTE — Telephone Encounter (Signed)
Do we have anything sooner ?

## 2014-03-13 NOTE — Telephone Encounter (Signed)
[name] see below my chart appointmetn  Just wanted to make sure it was ok to wait that long   Appointment For: Michele Fernandez, Michele Fernandez ([id number])   Visit Type: MYCHART OFFICE VISIT (1064)      03/31/2014 4:15 PM 15 mins. Crecencio Mc, MD [facility]       Patient Comments:   Office Visit   evaluate small palpable lump in rt lower abd

## 2014-03-13 NOTE — Telephone Encounter (Signed)
Spoke with pt.  Pt stated her brother was critically ill and she would be going to [place] for the next week

## 2014-03-13 NOTE — Telephone Encounter (Signed)
Then go with original appointment.

## 2014-03-13 NOTE — Telephone Encounter (Signed)
Is it ok to use one the of acutes on Tuesday

## 2014-03-13 NOTE — Telephone Encounter (Signed)
Yes or Monday

## 2014-03-18 ENCOUNTER — Ambulatory Visit: Payer: Medicare Other | Admitting: Podiatry

## 2014-03-25 ENCOUNTER — Ambulatory Visit: Payer: Self-pay | Admitting: Internal Medicine

## 2014-03-31 ENCOUNTER — Ambulatory Visit (INDEPENDENT_AMBULATORY_CARE_PROVIDER_SITE_OTHER): Payer: Medicare Other | Admitting: Internal Medicine

## 2014-03-31 ENCOUNTER — Encounter: Payer: Self-pay | Admitting: Internal Medicine

## 2014-03-31 ENCOUNTER — Encounter: Payer: Self-pay | Admitting: *Deleted

## 2014-03-31 VITALS — BP 152/76 | HR 87 | Temp 98.6°F | Resp 16 | Wt 156.5 lb

## 2014-03-31 DIAGNOSIS — D1739 Benign lipomatous neoplasm of skin and subcutaneous tissue of other sites: Secondary | ICD-10-CM

## 2014-03-31 DIAGNOSIS — D171 Benign lipomatous neoplasm of skin and subcutaneous tissue of trunk: Secondary | ICD-10-CM

## 2014-03-31 NOTE — Progress Notes (Signed)
Pre-visit discussion using our clinic review tool. No additional management support is needed unless otherwise documented below in the visit note.  

## 2014-04-01 DIAGNOSIS — D171 Benign lipomatous neoplasm of skin and subcutaneous tissue of trunk: Secondary | ICD-10-CM | POA: Insufficient documentation

## 2014-04-01 NOTE — Progress Notes (Signed)
Patient ID: Michele Fernandez, female   DOB: 04/17/1946, 68 y.o.   MRN: [id number]  Patient Active Problem List   Diagnosis Date Noted  . 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 Acute back pain less than 4 weeks duration 10/06/2013  . Lump or mass in breast 08/06/2013  . History of breast lump/mass excision 08/05/2013  . ASCUS (atypical squamous cells of undetermined significance) on Pap smear 08/05/2013  . Hypertension 12/10/2012  . Osteoporosis 12/10/2012    Subjective:  CC:   Chief Complaint  Patient presents with  . Acute Visit    Patient stated she had small lump in right lower abdomen.    HPI:   Michele Fernandez is a 68 y.o. female who presents for Evaluation of a lump on her abdomen. Found it several weeks ago,  No history of trauma,  Worried it was on ovarian mass.  No change in size.    Past Medical History  Diagnosis Date  . GERD (gastroesophageal reflux disease)   . Other sign and symptom in breast     Past Surgical History  Procedure Laterality Date  . Breast biopsy Bilateral 1993  . Breast biopsy Left 1989  . Upper gi endoscopy  2006  . Hammer toe surgery  2006  . Foot surgery  2009, 2012  . Cerebral aneurysm repair  1991       The following portions of the patient's history were reviewed and updated as appropriate: Allergies, current medications, and problem list.    Review of Systems:   Patient denies headache, fevers, malaise, unintentional weight loss, skin rash, eye pain, sinus congestion and sinus pain, sore throat, dysphagia,  hemoptysis , cough, dyspnea, wheezing, chest pain, palpitations, orthopnea, edema, abdominal pain, nausea, melena, diarrhea, constipation, flank pain, dysuria, hematuria, urinary  Frequency, nocturia, numbness, tingling, seizures,  Focal weakness, Loss of consciousness,  Tremor, insomnia, depression, anxiety, and suicidal ideation.     History   Social History  . Marital Status: Married    Spouse Name: N/A   Number of Children: N/A  . Years of Education: N/A   Occupational History  . Not on file.   Social History Main Topics  . Smoking status: Former Smoker -- 1.00 packs/day for 15 years    Types: Cigarettes    Quit date: 12/10/1972  . Smokeless tobacco: Never Used  . Alcohol Use: No  . Drug Use: No  . Sexual Activity: Not on file   Other Topics Concern  . Not on file   Social History Narrative  . No narrative on file    Objective:  Filed Vitals:   03/31/14 1628  BP: 152/76  Pulse: 87  Temp: 98.6 F (37 C)  Resp: 16     General appearance: alert, cooperative and appears stated age Ears: normal TM's and external ear canals both ears Throat: lips, mucosa, and tongue normal; teeth and gums normal Neck: no adenopathy, no carotid bruit, supple, symmetrical, trachea midline and thyroid not enlarged, symmetric, no tenderness/mass/nodules Back: symmetric, no curvature. ROM normal. No CVA tenderness. Lungs: clear to auscultation bilaterally Heart: regular rate and rhythm, S1, S2 normal, no murmur, click, rub or gallop Abdomen: soft, non-tender; bowel sounds normal; no masses,  no organomegaly Pulses: 2+ and symmetric Skin: Skin color, texture, turgor normal.. Small subcutaneous nodule on right side of abdomen Lymph nodes: Cervical, supraclavicular, and axillary nodes normal.  Assessment and Plan:  Lipoma of abdominal wall reassurance provided. No workup needed   Updated Medication List  Outpatient Encounter Prescriptions as of 03/31/2014  Medication Sig  . Calcium Carb-Cholecalciferol (HM CALCIUM-VITAMIN D) 600-400 MG-UNIT TABS Take 1 tablet by mouth daily.  . hydrochlorothiazide (HYDRODIURIL) 25 MG tablet TAKE 1 TABLET EVERY DAY USUALLY IN THE MORNING  . magnesium oxide (MAG-OX) 400 MG tablet Take 400 mg by mouth daily as needed.  . Multiple Vitamins-Minerals (CENTRUM PO) Take by mouth daily.  . Multiple Vitamins-Minerals (IPRIFLAVONE OSTEO FORMULA PO) Take 1 tablet by  mouth daily.  . butalbital-acetaminophen-caffeine (ESGIC) 50-325-40 MG per tablet Take 1 tablet by mouth daily as needed.  . cyclobenzaprine (FLEXERIL) 5 MG tablet Take 1 tablet (5 mg total) by mouth 3 (three) times daily as needed for muscle spasms.  . traMADol (ULTRAM) 50 MG tablet Take 1 tablet (50 mg total) by mouth every 8 (eight) hours as needed for pain.     No orders of the defined types were placed in this encounter.    No Follow-up on file.

## 2014-04-01 NOTE — Assessment & Plan Note (Signed)
reassurance provided. No workup needed

## 2014-05-06 ENCOUNTER — Encounter: Payer: Self-pay | Admitting: Podiatrist

## 2014-05-14 ENCOUNTER — Ambulatory Visit (INDEPENDENT_AMBULATORY_CARE_PROVIDER_SITE_OTHER): Payer: Medicare Other

## 2014-05-14 ENCOUNTER — Encounter: Payer: Self-pay | Admitting: Podiatry

## 2014-05-14 ENCOUNTER — Ambulatory Visit (INDEPENDENT_AMBULATORY_CARE_PROVIDER_SITE_OTHER): Payer: Medicare Other | Admitting: Podiatry

## 2014-05-14 VITALS — BP 168/90 | HR 81 | Resp 16

## 2014-05-14 DIAGNOSIS — M2040 Other hammer toe(s) (acquired), unspecified foot: Secondary | ICD-10-CM

## 2014-05-15 NOTE — Progress Notes (Signed)
She presents today with a chief complaint of third digit under lapping her second digit. After her surgery her second digit did not lay down flat and is allowing the third digit to cut but needed. She states is been coming painful to walk on.  Objective: Vital signs are stable she is alert and oriented x3. Pulses are strongly palpable to the left foot. She has medial deviation to the third digit of the left foot with a slight elevation of the corrected second toe at the metatarsophalangeal joint. 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 Screw is intact to the second toe.  Assessment: Hammertoe with arthritis nerve left capsulitis.  Plan: I suggested releasing the second metatarsophalangeal joint and correcting the third digit. She would like to have this done at some point probably in the fall at the hospital.

## 2014-06-05 ENCOUNTER — Other Ambulatory Visit: Payer: Self-pay | Admitting: Internal Medicine

## 2014-06-22 ENCOUNTER — Encounter: Payer: Self-pay | Admitting: General Surgery

## 2014-07-31 ENCOUNTER — Encounter: Payer: Self-pay | Admitting: General Surgery

## 2014-08-03 ENCOUNTER — Encounter: Payer: Self-pay | Admitting: General Surgery

## 2014-08-06 ENCOUNTER — Ambulatory Visit (INDEPENDENT_AMBULATORY_CARE_PROVIDER_SITE_OTHER): Payer: Medicare Other | Admitting: General Surgery

## 2014-08-06 ENCOUNTER — Encounter: Payer: Self-pay | Admitting: General Surgery

## 2014-08-06 VITALS — BP 138/78 | HR 72 | Resp 10 | Ht 61.0 in | Wt 158.0 lb

## 2014-08-06 DIAGNOSIS — N63 Unspecified lump in unspecified breast: Secondary | ICD-10-CM

## 2014-08-06 NOTE — Progress Notes (Signed)
Patient ID: Michele Fernandez, female   DOB: 1946/08/15, 68 y.o.   MRN: [id number]  Chief Complaint  Patient presents with  . Follow-up    mammogram    HPI Michele Fernandez is a 68 y.o. female.  who presents for her follow up mammogram and breast evaluation. The most recent mammogram was done on 07-30-14.  Patient does perform regular self breast checks and gets regular mammograms done.  No new breast issues.  HPI  Past Medical History  Diagnosis Date  . GERD (gastroesophageal reflux disease)   . Other sign and symptom in breast     Past Surgical History  Procedure Laterality Date  . Breast biopsy Bilateral 1993  . Breast biopsy Left 1989  . Upper gi endoscopy  2006  . Hammer toe surgery  2006  . Foot surgery  2009, 2012  . Cerebral aneurysm repair  1991  . Colonoscopy  2014    [facility]    Family History  Problem Relation Age of Onset  . Alcohol abuse Son   . Cancer Maternal Aunt     ovarian  . Heart attack Maternal Grandmother   . Cancer Brother     lung ca,   sepsis pneumonia    Social History History  Substance Use Topics  . Smoking status: Former Smoker -- 1.00 packs/day for 15 years    Types: Cigarettes    Quit date: 12/10/1972  . Smokeless tobacco: Never Used  . Alcohol Use: No    Allergies  Allergen Reactions  . Biaxin [Clarithromycin] Nausea Only    Metallic taste  . Demerol [Meperidine] Nausea And Vomiting    IV Demerol Only    Current Outpatient Prescriptions  Medication Sig Dispense Refill  . butalbital-acetaminophen-caffeine (ESGIC) 50-325-40 MG per tablet Take 1 tablet by mouth daily as needed.      . Calcium Carb-Cholecalciferol (HM CALCIUM-VITAMIN D) 600-400 MG-UNIT TABS Take 1 tablet by mouth daily.      . cyclobenzaprine (FLEXERIL) 5 MG tablet Take 1 tablet (5 mg total) by mouth 3 (three) times daily as needed for muscle spasms.  90 tablet  2  . hydrochlorothiazide (HYDRODIURIL) 25 MG tablet TAKE 1 TABLET EVERY DAY USUALLY  IN THE MORNING  30 tablet  3  . magnesium oxide (MAG-OX) 400 MG tablet Take 400 mg by mouth daily as needed.      . Multiple Vitamins-Minerals (CENTRUM PO) Take by mouth daily.      . Multiple Vitamins-Minerals (IPRIFLAVONE OSTEO FORMULA PO) Take 1 tablet by mouth daily.       No current facility-administered medications for this visit.    Review of Systems Review of Systems  Constitutional: Negative.   Respiratory: Negative.   Cardiovascular: Negative.     Blood pressure 138/78, pulse 72, resp. rate 10, height 5\' 1"  (1.549 m), weight 158 lb (71.668 kg).  Physical Exam Physical Exam  Constitutional: She is oriented to person, place, and time. She appears well-developed and well-nourished.  Neck: Neck supple.  Cardiovascular: Normal rate, regular rhythm and normal heart sounds.   Pulmonary/Chest: Effort normal and breath sounds normal. Right breast exhibits no inverted nipple, no mass, no nipple discharge, no skin change and no tenderness. Left breast exhibits no inverted nipple, no mass, no nipple discharge, no skin change and no tenderness.  Lymphadenopathy:    She has no cervical adenopathy.    She has no axillary adenopathy.  Neurological: She is alert and oriented to person, place, and time.  Skin: Skin is warm and dry.    Data Reviewed Screening mammograms dated July 30, 2014 suggested rounded densities in the right breast. BI-RAD-0. Focal spot compression views did July 31, 2014 showed no worrisome in her we'll change. BI-RAD-2.  Assessment    Benign breast exam.     Plan      The patient desires to continue annual screening exam through this office. We'll arrange for bilateral screening mammograms in one year.     PCP: Wende Mott 08/07/2014, 7:10 PM

## 2014-08-06 NOTE — Patient Instructions (Addendum)
Continue self breast exams. Call office for any new breast issues or concerns. 

## 2014-09-30 ENCOUNTER — Encounter: Payer: Self-pay | Admitting: Internal Medicine

## 2014-10-05 ENCOUNTER — Encounter: Payer: Self-pay | Admitting: Internal Medicine

## 2014-10-05 ENCOUNTER — Ambulatory Visit (INDEPENDENT_AMBULATORY_CARE_PROVIDER_SITE_OTHER): Payer: Medicare Other | Admitting: Internal Medicine

## 2014-10-05 ENCOUNTER — Encounter: Payer: Self-pay | Admitting: *Deleted

## 2014-10-05 VITALS — BP 144/82 | HR 71 | Temp 98.0°F | Resp 16 | Ht 61.0 in | Wt 157.0 lb

## 2014-10-05 DIAGNOSIS — E663 Overweight: Secondary | ICD-10-CM

## 2014-10-05 DIAGNOSIS — M810 Age-related osteoporosis without current pathological fracture: Secondary | ICD-10-CM

## 2014-10-05 DIAGNOSIS — R5383 Other fatigue: Secondary | ICD-10-CM

## 2014-10-05 DIAGNOSIS — I10 Essential (primary) hypertension: Secondary | ICD-10-CM

## 2014-10-05 DIAGNOSIS — E785 Hyperlipidemia, unspecified: Secondary | ICD-10-CM

## 2014-10-05 LAB — COMPREHENSIVE METABOLIC PANEL
ALBUMIN: 3.4 g/dL — AB (ref 3.5–5.2)
ALK PHOS: 51 U/L (ref 39–117)
ALT: 24 U/L (ref 0–35)
AST: 25 U/L (ref 0–37)
BUN: 14 mg/dL (ref 6–23)
CO2: 21 mEq/L (ref 19–32)
Calcium: 8.9 mg/dL (ref 8.4–10.5)
Chloride: 109 mEq/L (ref 96–112)
Creatinine, Ser: 0.8 mg/dL (ref 0.4–1.2)
GFR: 80.92 mL/min (ref >60.00)
GLUCOSE: 85 mg/dL (ref 70–99)
POTASSIUM: 4 meq/L (ref 3.5–5.1)
Sodium: 141 mEq/L (ref 135–145)
Total Bilirubin: 0.7 mg/dL (ref 0.2–1.2)
Total Protein: 6.7 g/dL (ref 6.0–8.3)

## 2014-10-05 LAB — CBC WITH DIFFERENTIAL/PLATELET
BASOS ABS: 0 10*3/uL (ref 0.0–0.1)
Basophils Relative: 0.4 % (ref 0.0–3.0)
Eosinophils Absolute: 0.1 10*3/uL (ref 0.0–0.7)
Eosinophils Relative: 1.6 % (ref 0.0–5.0)
HEMATOCRIT: 43.2 % (ref 36.0–46.0)
Hemoglobin: 14.2 g/dL (ref 12.0–15.0)
LYMPHS ABS: 1.9 10*3/uL (ref 0.7–4.0)
Lymphocytes Relative: 32.1 % (ref 12.0–46.0)
MCHC: 33 g/dL (ref 30.0–36.0)
MCV: 87.3 fl (ref 78.0–100.0)
MONO ABS: 0.3 10*3/uL (ref 0.1–1.0)
Monocytes Relative: 5.4 % (ref 3.0–12.0)
Neutro Abs: 3.6 10*3/uL (ref 1.4–7.7)
Neutrophils Relative %: 60.5 % (ref 43.0–77.0)
PLATELETS: 268 10*3/uL (ref 150.0–400.0)
RBC: 4.94 Mil/uL (ref 3.87–5.11)
RDW: 13.4 % (ref 11.5–15.5)
WBC: 6 10*3/uL (ref 4.0–10.5)

## 2014-10-05 LAB — LIPID PANEL
Cholesterol: 218 mg/dL — ABNORMAL HIGH (ref 0–200)
HDL: 54.9 mg/dL (ref >39.00)
LDL Cholesterol: 143 mg/dL — ABNORMAL HIGH (ref 0–99)
NONHDL: 163.1
Total CHOL/HDL Ratio: 4
Triglycerides: 101 mg/dL (ref 0.0–149.0)
VLDL: 20.2 mg/dL (ref 0.0–40.0)

## 2014-10-05 LAB — TSH: TSH: 0.76 u[IU]/mL (ref 0.35–4.50)

## 2014-10-05 LAB — VITAMIN D 25 HYDROXY (VIT D DEFICIENCY, FRACTURES): VITD: 33.96 ng/mL (ref 30.00–100.00)

## 2014-10-05 MED ORDER — LOSARTAN POTASSIUM-HCTZ 50-12.5 MG PO TABS: 1.0000 | ORAL_TABLET | Freq: Every day | ORAL | Status: DC

## 2014-10-05 NOTE — Patient Instructions (Addendum)
I recommend getting the majority of your calcium and Vitamin D  through diet rather than supplements given the recent association of calcium supplements with increased coronary artery calcium scores (You need 1200 mg daily )   Unsweetened almond/coconut milk is a great low calorie low carb, cholesterol free  way to increase your dietary calcium and vitamin D.  Try the blue Jackquline Bosch  I am adding losartan to your hctz to lower your blood pressure  please return in a week or two to check your potassium level on the new medication     This is  my version of a  "Low GI"  Diet:     All of the foods can be found at grocery stores and in bulk at [employer].  The Atkins protein bars and shakes are available in more varieties at [employer], [employer] and [place].     7 AM Breakfast:  Choose from the following:  Low carbohydrate Protein  Shakes (I recommend the EAS AdvantEdge "Carb Control" shakes  Or the low carb shakes by Atkins.    2.5 carbs   Arnold's "Sandwhich Thin"toasted  w/ peanut butter (no jelly: about 20 net carbs  "Bagel Thin" with cream cheese and salmon: about 20 carbs   a scrambled egg/bacon/cheese burrito made with Mission's "carb balance" whole wheat tortilla  (about 10 net carbs )  A slice of home made fritatta (egg based dish without a crust:  google it)    Avoid cereal and bananas, oatmeal and cream of wheat and grits. They are loaded with carbohydrates!   10 AM: high protein snack  Protein bar by Atkins (the snack size, under 200 cal, usually < 6 net carbs).    A stick of cheese:  Around 1 carb,  100 cal     Dannon Light n Fit [place] Yogurt  (80 cal, 8 carbs)  Other so called "protein bars" and Greek yogurts tend to be loaded with carbohydrates.  Remember, in food advertising, the word "energy" is synonymous for " carbohydrate."  Lunch:   A Sandwich using the bread choices listed, Can use any  Eggs,  lunchmeat, grilled meat or canned tuna), avocado, regular mayo/mustard   and cheese.  A Salad using blue cheese, ranch,  Goddess or vinagrette,  No croutons or "confetti" and no "candied nuts" but regular nuts OK.   No pretzels or chips.  Pickles and miniature sweet peppers are a good low carb alternative that provide a "crunch"  The bread is the only source of carbohydrate in a sandwich and  can be decreased by trying some of these alternatives to traditional loaf bread  Joseph's makes a pita bread and a flat bread that are 50 cal and 4 net carbs available at [place] and [place].  This can be toasted to use with hummous as well  Toufayan makes a low carb flatbread that's 100 cal and 9 net carbs available at [employer] and [employer]'s makes 2 sizes of  Low carb whole wheat tortilla  (The large one is 210 cal and 6 net carbs)  Flat Out makes flatbreads that are low carb as well  Avoid "Low fat dressings, as well as Barry Brunner and Brandon dressings They are loaded with sugar!   3 PM/ Mid day  Snack:  Consider  1 ounce of  almonds, walnuts, pistachios, pecans, peanuts,  Macadamia nuts or a nut medley.  Avoid "granola"; the dried cranberries and raisins are loaded with carbohydrates.  Mixed nuts as long as there are no raisins,  cranberries or dried fruit.    Try the prosciutto/mozzarella cheese sticks by [employer]ci  In deli /backery section   High protein   To avoid overindulging in snacks: Try drinking a glass of unsweeted almond/coconut milk  Or a cup of coffee with your Atkins chocolate bar to keep you from having 3!!!   Pork rinds!  Yes Pork Rinds        6 PM  Dinner:     Meat/fowl/fish with a green salad, and either broccoli, cauliflower, green beans, spinach, brussel sprouts or  Lima beans. DO NOT BREAD THE PROTEIN!!      There is a low carb pasta by Dreamfield's that is acceptable and tastes great: only 5 digestible carbs/serving.( All grocery stores but BJs carry it )  Try Hurley Cisco Angelo's chicken piccata or chicken or eggplant parm over low carb pasta.(Lowes  and BJs)   Marjory Lies Sanchez's "Carnitas" (pulled pork, no sauce,  0 carbs) or his beef pot roast to make a dinner burrito (at [employer]J's)  Pesto over low carb pasta (bj's sells a good quality pesto in the center refrigerated section of the deli   Try satueeing  Cheral Marker with mushroooms  Whole wheat pasta is still full of digestible carbs and  Not as low in glycemic index as Dreamfield's.   Brown rice is still rice,  So skip the rice and noodles if you eat [place] or [place] (or at least limit to 1/2 cup)  9 PM snack :   Breyer's "low carb" fudgsicle or  ice cream bar (Carb Smart line), or  Weight Watcher's ice cream bar , or another "no sugar added" ice cream;  a serving of fresh berries/cherries with whipped cream   Cheese or DANNON'S LlGHT N FIT GREEK YOGURT or the [place] greek yogurt   8 ounces of [place] unsweetened almond/cococunut milk  Cheese and crackers (using WASA crackers,  They are low carb) or peanut butter on low carb crackers or pita bread     Avoid bananas, pineapple, grapes  and watermelon on a regular basis because they are high in sugar.  THINK OF THEM AS DESSERT  Remember that snack Substitutions should be less than 10 NET carbs per serving and meals should be < 25 net carbs. Remember that carbohydrates from fiber do not affect blood sugar, so you can  subtract fiber grams to get the "net carbs " of any particular food item.

## 2014-10-05 NOTE — Progress Notes (Signed)
Pre-visit discussion using our clinic review tool. No additional management support is needed unless otherwise documented below in the visit note.  

## 2014-10-05 NOTE — Progress Notes (Signed)
Patient ID: Michele Fernandez, female   DOB: 10-23-1946, 68 y.o.   MRN: [id number]  Patient Active Problem List   Diagnosis Date Noted  . Overweight (BMI 25.0-29.9) 10/06/2014  . 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 History of breast lump/mass excision 08/05/2013  . ASCUS (atypical squamous cells of undetermined significance) on Pap smear 08/05/2013  . Hypertension 12/10/2012  . Osteoporosis 12/10/2012    Subjective:  CC:   Chief Complaint  Patient presents with  . Follow-up    Hctz Patient is fasting for Blood work    HPI:   Michele Fernandez is a 68 y.o. female who presents for  Follow up on hypertension.  She has been taking her medications as directed. Blood pressures have been elevated on random checks.  She has had difficulty losing weight but is not exercising or follow a specfic diet.    Past Medical History  Diagnosis Date  . GERD (gastroesophageal reflux disease)   . Other sign and symptom in breast     Past Surgical History  Procedure Laterality Date  . Breast biopsy Bilateral 1993  . Breast biopsy Left 1989  . Upper gi endoscopy  2006  . Hammer toe surgery  2006  . Foot surgery  2009, 2012  . Cerebral aneurysm repair  1991  . Colonoscopy  2014    [facility] [place]       The following portions of the patient's history were reviewed and updated as appropriate: Allergies, current medications, and problem list.    Review of Systems:   Patient denies headache, fevers, malaise, unintentional weight loss, skin rash, eye pain, sinus congestion and sinus pain, sore throat, dysphagia,  hemoptysis , cough, dyspnea, wheezing, chest pain, palpitations, orthopnea, edema, abdominal pain, nausea, melena, diarrhea, constipation, flank pain, dysuria, hematuria, urinary  Frequency, nocturia, numbness, tingling, seizures,  Focal weakness, Loss of consciousness,  Tremor, insomnia, depression, anxiety, and suicidal ideation.     History   Social History  .  Marital Status: Married    Spouse Name: N/A    Number of Children: N/A  . Years of Education: N/A   Occupational History  . Not on file.   Social History Main Topics  . Smoking status: Former Smoker -- 1.00 packs/day for 15 years    Types: Cigarettes    Quit date: 12/10/1972  . Smokeless tobacco: Never Used  . Alcohol Use: No  . Drug Use: No  . Sexual Activity: Not on file   Other Topics Concern  . Not on file   Social History Narrative    Objective:  Filed Vitals:   10/05/14 0813  BP: 144/82  Pulse: 71  Temp: 98 F (36.7 C)  Resp: 16     General appearance: alert, cooperative and appears stated age Ears: normal TM's and external ear canals both ears Throat: lips, mucosa, and tongue normal; teeth and gums normal Neck: no adenopathy, no carotid bruit, supple, symmetrical, trachea midline and thyroid not enlarged, symmetric, no tenderness/mass/nodules Back: symmetric, no curvature. ROM normal. No CVA tenderness. Lungs: clear to auscultation bilaterally Heart: regular rate and rhythm, S1, S2 normal, no murmur, click, rub or gallop Abdomen: soft, non-tender; bowel sounds normal; no masses,  no organomegaly Pulses: 2+ and symmetric Skin: Skin color, texture, turgor normal. No rashes or lesions Lymph nodes: Cervical, supraclavicular, and axillary nodes normal.  Assessment and Plan:  Osteoporosis Prior trials of alendronate and ibandronate  For over 5 years,  repeat DEXa off of boniva x  1 year was still improving.  Repeat next year.  One year of Evista caused leg pain and lower back pain.   Hypertension Not well controlled on current regimen. Adding losartan .    Lab Results  Component Value Date   CREATININE 0.8 10/05/2014   Lab Results  Component Value Date   NA 141 10/05/2014   K 4.0 10/05/2014   CL 109 10/05/2014   CO2 21 10/05/2014     Overweight (BMI 25.0-29.9) I have addressed  BMI and recommended wt loss of 10% of body weight over the next 6  months using a low glycemic index diet and regular exercise a minimum of 5 days per week.     Updated Medication List Outpatient Encounter Prescriptions as of 10/05/2014  Medication Sig  . butalbital-acetaminophen-caffeine (ESGIC) 50-325-40 MG per tablet Take 1 tablet by mouth daily as needed.  . Calcium Carb-Cholecalciferol (HM CALCIUM-VITAMIN D) 600-400 MG-UNIT TABS Take 1 tablet by mouth daily.  . cyclobenzaprine (FLEXERIL) 5 MG tablet Take 1 tablet (5 mg total) by mouth 3 (three) times daily as needed for muscle spasms.  . magnesium oxide (MAG-OX) 400 MG tablet Take 400 mg by mouth daily as needed.  . Multiple Vitamins-Minerals (CENTRUM PO) Take by mouth daily.  . Multiple Vitamins-Minerals (IPRIFLAVONE OSTEO FORMULA PO) Take 1 tablet by mouth daily.  . [DISCONTINUED] hydrochlorothiazide (HYDRODIURIL) 25 MG tablet TAKE 1 TABLET EVERY DAY USUALLY IN THE MORNING  . losartan-hydrochlorothiazide (HYZAAR) 50-12.5 MG per tablet Take 1 tablet by mouth daily.     Orders Placed This Encounter  Procedures  . CBC with Differential  . Comprehensive metabolic panel  . TSH  . Lipid panel  . Vit D  25 hydroxy (rtn osteoporosis monitoring)    No Follow-up on file.

## 2014-10-05 NOTE — Assessment & Plan Note (Addendum)
Prior trials of alendronate and ibandronate  For over 5 years,  repeat DEXa off of boniva x 1 year was still improving.  Repeat next year.  One year of Evista caused leg pain and lower back pain.

## 2014-10-06 ENCOUNTER — Telehealth: Payer: Self-pay | Admitting: Internal Medicine

## 2014-10-06 DIAGNOSIS — E663 Overweight: Secondary | ICD-10-CM | POA: Insufficient documentation

## 2014-10-06 NOTE — Assessment & Plan Note (Addendum)
Not well controlled on current regimen. Adding losartan .    Lab Results  Component Value Date   CREATININE 0.8 10/05/2014   Lab Results  Component Value Date   NA 141 10/05/2014   K 4.0 10/05/2014   CL 109 10/05/2014   CO2 21 10/05/2014

## 2014-10-06 NOTE — Telephone Encounter (Signed)
emmi emailed °

## 2014-10-06 NOTE — Assessment & Plan Note (Signed)
I have addressed  BMI and recommended wt loss of 10% of body weight over the next 6 months using a low glycemic index diet and regular exercise a minimum of 5 days per week.   

## 2014-10-21 ENCOUNTER — Telehealth: Payer: Self-pay | Admitting: *Deleted

## 2014-10-21 DIAGNOSIS — Z79899 Other long term (current) drug therapy: Secondary | ICD-10-CM

## 2014-10-21 DIAGNOSIS — I10 Essential (primary) hypertension: Secondary | ICD-10-CM

## 2014-10-21 NOTE — Telephone Encounter (Signed)
Pt is coming in Friday what labs and dx?

## 2014-10-22 NOTE — Addendum Note (Signed)
Addended by: Crecencio Mc on: 10/22/2014 07:00 AM   Modules accepted: Orders

## 2014-10-22 NOTE — Assessment & Plan Note (Signed)
Repeat BMET after adding losartan ordered for Nov 20

## 2014-10-23 ENCOUNTER — Other Ambulatory Visit (INDEPENDENT_AMBULATORY_CARE_PROVIDER_SITE_OTHER): Payer: Medicare Other

## 2014-10-23 DIAGNOSIS — Z79899 Other long term (current) drug therapy: Secondary | ICD-10-CM

## 2014-10-23 LAB — BASIC METABOLIC PANEL
BUN: 13 mg/dL (ref 6–23)
CALCIUM: 9.1 mg/dL (ref 8.4–10.5)
CHLORIDE: 110 meq/L (ref 96–112)
CO2: 26 meq/L (ref 19–32)
Creatinine, Ser: 0.8 mg/dL (ref 0.4–1.2)
GFR: 78.52 mL/min (ref >60.00)
Glucose, Bld: 91 mg/dL (ref 70–99)
Potassium: 4.5 mEq/L (ref 3.5–5.1)
SODIUM: 141 meq/L (ref 135–145)

## 2014-10-23 NOTE — Addendum Note (Signed)
Addended by: Johnsie Cancel on: 10/23/2014 09:20 AM   Modules accepted: Orders

## 2014-10-25 ENCOUNTER — Encounter: Payer: Self-pay | Admitting: Internal Medicine

## 2014-10-27 ENCOUNTER — Ambulatory Visit: Payer: Self-pay | Admitting: Ophthalmology

## 2014-11-02 ENCOUNTER — Encounter: Payer: Self-pay | Admitting: *Deleted

## 2014-11-03 HISTORY — PX: EYE SURGERY: SHX253

## 2014-11-09 ENCOUNTER — Ambulatory Visit: Payer: Self-pay | Admitting: Ophthalmology

## 2014-11-11 ENCOUNTER — Encounter: Payer: Self-pay | Admitting: Internal Medicine

## 2015-02-09 ENCOUNTER — Other Ambulatory Visit: Payer: Self-pay | Admitting: Internal Medicine

## 2015-03-27 NOTE — Op Note (Signed)
PATIENT NAME:  Michele Fernandez, Michele Fernandez MR#:  [id number] DATE OF BIRTH:  15-Sep-1946  DATE OF PROCEDURE:  11/09/2014  PREOPERATIVE DIAGNOSIS: Cataract, right eye.   POSTOPERATIVE DIAGNOSIS:  Cataract, right eye.  PROCEDURE PERFORMED: Extracapsular cataract extraction using phacoemulsification with placement of Alcon [id number] 19.0 diopter posterior chamber lens, serial number [id number].   ANESTHESIA: 4% lidocaine and 0.75% Marcaine a 50-50 mixture with 10 units/mL of Hylenex added, given as a peribulbar.   ANESTHESIOLOGIST: Gunnar Fusi, MD   COMPLICATIONS: None.   ESTIMATED BLOOD LOSS: Less than 1 mL.   DESCRIPTION OF PROCEDURE:  The patient was brought to the operating room and given a peribulbar block.  The patient was then prepped and draped in the usual fashion.  The vertical rectus muscles were imbricated using 5-0 silk sutures.  These sutures were then clamped to the sterile drapes as bridle sutures.  A limbal peritomy was performed extending two clock hours and hemostasis was obtained with cautery.  A partial thickness scleral groove was made at the surgical limbus and dissected anteriorly in a lamellar dissection using an Alcon crescent knife.  The anterior chamber was entered superonasally with a Superblade and through the lamellar dissection with a 2.6 mm keratome.  DisCoVisc was used to replace the aqueous and a continuous tear capsulorrhexis was carried out.  Hydrodissection and hydrodelineation were carried out with balanced salt and a 27 gauge canula.  The nucleus was rotated to confirm the effectiveness of the hydrodissection.  Phacoemulsification was carried out using a divide-and-conquer technique.  Total ultrasound time was 1 minute and 37 seconds with an average power of 25%, CDE of 40.15.  No suture was placed.   Irrigation/aspiration was used to remove the residual cortex.  DisCoVisc was used to inflate the capsule and the internal incision was enlarged to 3 mm with the  crescent knife.  The intraocular lens was folded and inserted into the capsular bag using the [employer] delivery system was used instead of [employer]. Irrigation/aspiration was used to remove the residual DisCoVisc, then 1/10 mL of cefuroxime containing 1 mg of drug was injected via the paracentesis track.   Miostat was injected into the anterior chamber through the paracentesis track to inflate the anterior chamber and induce miosis.  The wound was checked for leaks and none were found. The conjunctiva was closed with cautery and the bridle sutures were removed.  Two drops of 0.3% Vigamox were placed on the eye.   An eye shield was placed on the eye.  The patient was discharged to the recovery room in good condition.   ____________________________ [name], MD sad:ap D: 11/09/2014 13:32:36 ET T: 11/09/2014 13:45:12 ET JOB#: [id number]  cc: Remo Lipps A. Uva Runkel, MD, <Dictator> Martie Lee MD ELECTRONICALLY SIGNED 11/16/2014 10:48

## 2015-04-05 ENCOUNTER — Ambulatory Visit: Payer: Medicare Other | Admitting: Internal Medicine

## 2015-04-09 ENCOUNTER — Ambulatory Visit (INDEPENDENT_AMBULATORY_CARE_PROVIDER_SITE_OTHER): Payer: Medicare Other | Admitting: Internal Medicine

## 2015-04-09 ENCOUNTER — Encounter: Payer: Self-pay | Admitting: Internal Medicine

## 2015-04-09 VITALS — BP 136/82 | HR 76 | Temp 98.0°F | Resp 14 | Ht 61.0 in | Wt 158.2 lb

## 2015-04-09 DIAGNOSIS — Z23 Encounter for immunization: Secondary | ICD-10-CM

## 2015-04-09 DIAGNOSIS — I10 Essential (primary) hypertension: Secondary | ICD-10-CM | POA: Diagnosis not present

## 2015-04-09 DIAGNOSIS — E559 Vitamin D deficiency, unspecified: Secondary | ICD-10-CM

## 2015-04-09 LAB — COMPREHENSIVE METABOLIC PANEL
ALT: 27 U/L (ref 0–35)
AST: 22 U/L (ref 0–37)
Albumin: 4.1 g/dL (ref 3.5–5.2)
Alkaline Phosphatase: 61 U/L (ref 39–117)
BUN: 12 mg/dL (ref 6–23)
CALCIUM: 9.3 mg/dL (ref 8.4–10.5)
CO2: 29 meq/L (ref 19–32)
Chloride: 106 mEq/L (ref 96–112)
Creatinine, Ser: 0.8 mg/dL (ref 0.40–1.20)
GFR: 76.15 mL/min (ref >60.00)
GLUCOSE: 83 mg/dL (ref 70–99)
POTASSIUM: 3.6 meq/L (ref 3.5–5.1)
Sodium: 141 mEq/L (ref 135–145)
TOTAL PROTEIN: 6.8 g/dL (ref 6.0–8.3)
Total Bilirubin: 0.5 mg/dL (ref 0.2–1.2)

## 2015-04-09 LAB — VITAMIN D 25 HYDROXY (VIT D DEFICIENCY, FRACTURES): VITD: 25.56 ng/mL — ABNORMAL LOW (ref 30.00–100.00)

## 2015-04-09 MED ORDER — LOSARTAN POTASSIUM 50 MG PO TABS: 50.0000 mg | ORAL_TABLET | Freq: Every day | ORAL | Status: DC

## 2015-04-09 NOTE — Progress Notes (Signed)
Patient ID: Michele Fernandez, female   DOB: 22-Mar-1946, 69 y.o.   MRN: [id number]   Patient Active Problem List   Diagnosis Date Noted  . Overweight (BMI 25.0-29.9) 10/06/2014  . 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 History of breast lump/mass excision 08/05/2013  . ASCUS (atypical squamous cells of undetermined significance) on Pap smear 08/05/2013  . Hypertension 12/10/2012  . Osteoporosis 12/10/2012    Subjective:  CC:   Chief Complaint  Patient presents with  . Follow-up  . Hypertension    HPI:   OLA RAAP is a 69 y.o. female who presents for  Follow up on hypertension. She is tolerating her medication  And taking it daily with no side effects.  Checks BPs at home on a weekly basis and her BP is 130/80  Or lless. No new issues.      Past Medical History  Diagnosis Date  . GERD (gastroesophageal reflux disease)   . Other sign and symptom in breast     Past Surgical History  Procedure Laterality Date  . Breast biopsy Bilateral 1993  . Breast biopsy Left 1989  . Upper gi endoscopy  2006  . Hammer toe surgery  2006  . Foot surgery  2009, 2012  . Cerebral aneurysm repair  1991  . Colonoscopy  2014    [facility] [place]       The following portions of the patient's history were reviewed and updated as appropriate: Allergies, current medications, and problem list.    Review of Systems:   Patient denies headache, fevers, malaise, unintentional weight loss, skin rash, eye pain, sinus congestion and sinus pain, sore throat, dysphagia,  hemoptysis , cough, dyspnea, wheezing, chest pain, palpitations, orthopnea, edema, abdominal pain, nausea, melena, diarrhea, constipation, flank pain, dysuria, hematuria, urinary  Frequency, nocturia, numbness, tingling, seizures,  Focal weakness, Loss of consciousness,  Tremor, insomnia, depression, anxiety, and suicidal ideation.     History   Social History  . Marital Status: Married    Spouse Name: N/A  .  Number of Children: N/A  . Years of Education: N/A   Occupational History  . Not on file.   Social History Main Topics  . Smoking status: Former Smoker -- 1.00 packs/day for 15 years    Types: Cigarettes    Quit date: 12/10/1972  . Smokeless tobacco: Never Used  . Alcohol Use: No  . Drug Use: No  . Sexual Activity: Not on file   Other Topics Concern  . Not on file   Social History Narrative    Objective:  Filed Vitals:   04/09/15 0928  BP: 136/82  Pulse: 76  Temp: 98 F (36.7 C)  Resp: 14     General appearance: alert, cooperative and appears stated age Ears: normal TM's and external ear canals both ears Throat: lips, mucosa, and tongue normal; teeth and gums normal Neck: no adenopathy, no carotid bruit, supple, symmetrical, trachea midline and thyroid not enlarged, symmetric, no tenderness/mass/nodules Back: symmetric, no curvature. ROM normal. No CVA tenderness. Lungs: clear to auscultation bilaterally Heart: regular rate and rhythm, S1, S2 normal, no murmur, click, rub or gallop Abdomen: soft, non-tender; bowel sounds normal; no masses,  no organomegaly Pulses: 2+ and symmetric Skin: Skin color, texture, turgor normal. No rashes or lesions Lymph nodes: Cervical, supraclavicular, and axillary nodes normal.  Assessment and Plan:  Hypertension Well controlled on current regimen. Renal function stable, stopping the hctz for the summer.  continue losartan.  Lab Results  Component  Value Date   CREATININE 0.80 04/09/2015   Lab Results  Component Value Date   NA 141 04/09/2015   K 3.6 04/09/2015   CL 106 04/09/2015   CO2 29 04/09/2015       Updated Medication List Outpatient Encounter Prescriptions as of 04/09/2015  Medication Sig  . (No Medication Selected) Apply 1 application topically. Apply vaginally two to three times weekly. Progesterone 2% in Versa and Estriol 1 mg vaginal tab. Must call pharmacy for compound.  . butalbital-acetaminophen-caffeine  (ESGIC) 50-325-40 MG per tablet Take 1 tablet by mouth daily as needed.  . cyclobenzaprine (FLEXERIL) 5 MG tablet Take 1 tablet (5 mg total) by mouth 3 (three) times daily as needed for muscle spasms.  Marland Kitchen losartan (COZAAR) 50 MG tablet Take 1 tablet (50 mg total) by mouth daily.  . Calcium Carb-Cholecalciferol (HM CALCIUM-VITAMIN D) 600-400 MG-UNIT TABS Take 1 tablet by mouth daily.  . magnesium oxide (MAG-OX) 400 MG tablet Take 400 mg by mouth daily as needed.  . Multiple Vitamins-Minerals (CENTRUM PO) Take by mouth daily.  . Multiple Vitamins-Minerals (IPRIFLAVONE OSTEO FORMULA PO) Take 1 tablet by mouth daily.  . [DISCONTINUED] losartan-hydrochlorothiazide (HYZAAR) 50-12.5 MG per tablet TAKE 1 TABLET EVERY DAY   No facility-administered encounter medications on file as of 04/09/2015.     Orders Placed This Encounter  Procedures  . Pneumococcal polysaccharide vaccine 23-valent greater than or equal to 2yo subcutaneous/IM  . Comp Met (CMET)  . Vit D  25 hydroxy (rtn osteoporosis monitoring)    No Follow-up on file.

## 2015-04-09 NOTE — Patient Instructions (Addendum)
We are changing your BP medication to just losartan for the summer to avoid dehydration  If you want the hctz refilled separately,  Let me know  By e mail  You received the pneumovax vaccine  We'll do you physical in July/august

## 2015-04-09 NOTE — Progress Notes (Signed)
Pre-visit discussion using our clinic review tool. No additional management support is needed unless otherwise documented below in the visit note.  

## 2015-04-11 ENCOUNTER — Encounter: Payer: Self-pay | Admitting: Internal Medicine

## 2015-04-11 NOTE — Assessment & Plan Note (Signed)
Well controlled on current regimen. Renal function stable, stopping the hctz for the summer.  continue losartan.  Lab Results  Component Value Date   CREATININE 0.80 04/09/2015   Lab Results  Component Value Date   NA 141 04/09/2015   K 3.6 04/09/2015   CL 106 04/09/2015   CO2 29 04/09/2015

## 2015-05-10 ENCOUNTER — Ambulatory Visit (INDEPENDENT_AMBULATORY_CARE_PROVIDER_SITE_OTHER): Payer: Medicare Other | Admitting: Podiatry

## 2015-05-10 ENCOUNTER — Encounter: Payer: Self-pay | Admitting: Podiatry

## 2015-05-10 VITALS — BP 145/84 | HR 69 | Resp 16 | Ht 61.0 in | Wt 155.0 lb

## 2015-05-10 DIAGNOSIS — M2042 Other hammer toe(s) (acquired), left foot: Secondary | ICD-10-CM

## 2015-05-10 NOTE — Progress Notes (Signed)
She presents today for follow-up of a contracted second metatarsophalangeal joint of her left foot this foot has had surgery before and the third toe is starting to underlapped the second toe. She states that she would like to have this surgically corrected at all possible.   Objective: vital signs stable alert and oriented 3. Pulses are palpable. Contracted second metatarsophalangeal joint dorsally with under lapping her toe left. Reviewed previous radiographs demonstrating good position of the toe and second metatarsal osteotomy. Second metatarsophalangeal joint is flexible.  Assessment: contracted second metatarsophalangeal joint left.  Plan: We discussed the need for surgical intervention today consisting of possible removal of screw second digit left foot. Also she would entertain having the third toe corrected as well. I did discuss placing a pin across the second metatarsophalangeal joint to hold the toe in place. She understands that and is amenable to it if necessary. I will follow-up with her in follow-up for surgery.

## 2015-06-23 ENCOUNTER — Other Ambulatory Visit: Payer: Self-pay

## 2015-06-23 ENCOUNTER — Encounter: Payer: Self-pay | Admitting: Internal Medicine

## 2015-06-23 DIAGNOSIS — Z1231 Encounter for screening mammogram for malignant neoplasm of breast: Secondary | ICD-10-CM

## 2015-07-06 ENCOUNTER — Other Ambulatory Visit: Payer: Self-pay | Admitting: Internal Medicine

## 2015-07-07 ENCOUNTER — Other Ambulatory Visit: Payer: Self-pay

## 2015-07-07 MED ORDER — LOSARTAN POTASSIUM 50 MG PO TABS: 50.0000 mg | ORAL_TABLET | Freq: Every day | ORAL | Status: DC

## 2015-07-13 ENCOUNTER — Encounter: Payer: Self-pay | Admitting: Internal Medicine

## 2015-07-13 ENCOUNTER — Ambulatory Visit (INDEPENDENT_AMBULATORY_CARE_PROVIDER_SITE_OTHER): Payer: Medicare Other | Admitting: Internal Medicine

## 2015-07-13 VITALS — BP 150/78 | HR 67 | Temp 97.7°F | Resp 14 | Ht 61.0 in | Wt 154.2 lb

## 2015-07-13 DIAGNOSIS — Z113 Encounter for screening for infections with a predominantly sexual mode of transmission: Secondary | ICD-10-CM

## 2015-07-13 DIAGNOSIS — Z0000 Encounter for general adult medical examination without abnormal findings: Secondary | ICD-10-CM

## 2015-07-13 DIAGNOSIS — R102 Pelvic and perineal pain unspecified side: Secondary | ICD-10-CM

## 2015-07-13 DIAGNOSIS — L247 Irritant contact dermatitis due to plants, except food: Secondary | ICD-10-CM | POA: Diagnosis not present

## 2015-07-13 DIAGNOSIS — Z01411 Encounter for gynecological examination (general) (routine) with abnormal findings: Secondary | ICD-10-CM

## 2015-07-13 DIAGNOSIS — R5383 Other fatigue: Secondary | ICD-10-CM | POA: Diagnosis not present

## 2015-07-13 DIAGNOSIS — R938 Abnormal findings on diagnostic imaging of other specified body structures: Secondary | ICD-10-CM

## 2015-07-13 DIAGNOSIS — I10 Essential (primary) hypertension: Secondary | ICD-10-CM

## 2015-07-13 DIAGNOSIS — R030 Elevated blood-pressure reading, without diagnosis of hypertension: Secondary | ICD-10-CM

## 2015-07-13 NOTE — Assessment & Plan Note (Signed)
Home bps have been consistently 120/70  And home meter has been validated.

## 2015-07-13 NOTE — Patient Instructions (Signed)
Return for fastin glabs  Pelvic ultrasound to bo ordered  Health Maintenance Adopting a healthy lifestyle and getting preventive care can go a long way to promote health and wellness. Talk with your health care provider about what schedule of regular examinations is right for you. This is a good chance for you to check in with your provider about disease prevention and staying healthy. In between checkups, there are plenty of things you can do on your own. Experts have done a lot of research about which lifestyle changes and preventive measures are most likely to keep you healthy. Ask your health care provider for more information. WEIGHT AND DIET  Eat a healthy diet  Be sure to include plenty of vegetables, fruits, low-fat dairy products, and lean protein.  Do not eat a lot of foods high in solid fats, added sugars, or salt.  Get regular exercise. This is one of the most important things you can do for your health.  Most adults should exercise for at least 150 minutes each week. The exercise should increase your heart rate and make you sweat (moderate-intensity exercise).  Most adults should also do strengthening exercises at least twice a week. This is in addition to the moderate-intensity exercise.  Maintain a healthy weight  Body mass index (BMI) is a measurement that can be used to identify possible weight problems. It estimates body fat based on height and weight. Your health care provider can help determine your BMI and help you achieve or maintain a healthy weight.  For females 51 years of age and older:   A BMI below 18.5 is considered underweight.  A BMI of 18.5 to 24.9 is normal.  A BMI of 25 to 29.9 is considered overweight.  A BMI of 30 and above is considered obese.  Watch levels of cholesterol and blood lipids  You should start having your blood tested for lipids and cholesterol at 67 years of age, then have this test every 5 years.  You may need to have your  cholesterol levels checked more often if:  Your lipid or cholesterol levels are high.  You are older than 67 years of age.  You are at high risk for heart disease.  CANCER SCREENING   Lung Cancer  Lung cancer screening is recommended for adults 77-51 years old who are at high risk for lung cancer because of a history of smoking.  A yearly low-dose CT scan of the lungs is recommended for people who:  Currently smoke.  Have quit within the past 15 years.  Have at least a 30-pack-year history of smoking. A pack year is smoking an average of one pack of cigarettes a day for 1 year.  Yearly screening should continue until it has been 15 years since you quit.  Yearly screening should stop if you develop a health problem that would prevent you from having lung cancer treatment.  Breast Cancer  Practice breast self-awareness. This means understanding how your breasts normally appear and feel.  It also means doing regular breast self-exams. Let your health care provider know about any changes, no matter how small.  If you are in your 20s or 30s, you should have a clinical breast exam (CBE) by a health care provider every 1-3 years as part of a regular health exam.  If you are 1 or older, have a CBE every year. Also consider having a breast X-ray (mammogram) every year.  If you have a family history of breast cancer, talk to  your health care provider about genetic screening.  If you are at high risk for breast cancer, talk to your health care provider about having an MRI and a mammogram every year.  Breast cancer gene (BRCA) assessment is recommended for women who have family members with BRCA-related cancers. BRCA-related cancers include:  Breast.  Ovarian.  Tubal.  Peritoneal cancers.  Results of the assessment will determine the need for genetic counseling and BRCA1 and BRCA2 testing. Cervical Cancer Routine pelvic examinations to screen for cervical cancer are no  longer recommended for nonpregnant women who are considered low risk for cancer of the pelvic organs (ovaries, uterus, and vagina) and who do not have symptoms. A pelvic examination may be necessary if you have symptoms including those associated with pelvic infections. Ask your health care provider if a screening pelvic exam is right for you.   The Pap test is the screening test for cervical cancer for women who are considered at risk.  If you had a hysterectomy for a problem that was not cancer or a condition that could lead to cancer, then you no longer need Pap tests.  If you are older than 65 years, and you have had normal Pap tests for the past 10 years, you no longer need to have Pap tests.  If you have had past treatment for cervical cancer or a condition that could lead to cancer, you need Pap tests and screening for cancer for at least 20 years after your treatment.  If you no longer get a Pap test, assess your risk factors if they change (such as having a new sexual partner). This can affect whether you should start being screened again.  Some women have medical problems that increase their chance of getting cervical cancer. If this is the case for you, your health care provider may recommend more frequent screening and Pap tests.  The human papillomavirus (HPV) test is another test that may be used for cervical cancer screening. The HPV test looks for the virus that can cause cell changes in the cervix. The cells collected during the Pap test can be tested for HPV.  The HPV test can be used to screen women 58 years of age and older. Getting tested for HPV can extend the interval between normal Pap tests from three to five years.  An HPV test also should be used to screen women of any age who have unclear Pap test results.  After 67 years of age, women should have HPV testing as often as Pap tests.  Colorectal Cancer  This type of cancer can be detected and often  prevented.  Routine colorectal cancer screening usually begins at 67 years of age and continues through 67 years of age.  Your health care provider may recommend screening at an earlier age if you have risk factors for colon cancer.  Your health care provider may also recommend using home test kits to check for hidden blood in the stool.  A small camera at the end of a tube can be used to examine your colon directly (sigmoidoscopy or colonoscopy). This is done to check for the earliest forms of colorectal cancer.  Routine screening usually begins at age 43.  Direct examination of the colon should be repeated every 5-10 years through 67 years of age. However, you may need to be screened more often if early forms of precancerous polyps or small growths are found. Skin Cancer  Check your skin from head to toe regularly.  Tell  your health care provider about any new moles or changes in moles, especially if there is a change in a mole's shape or color.  Also tell your health care provider if you have a mole that is larger than the size of a pencil eraser.  Always use sunscreen. Apply sunscreen liberally and repeatedly throughout the day.  Protect yourself by wearing long sleeves, pants, a wide-brimmed hat, and sunglasses whenever you are outside. HEART DISEASE, DIABETES, AND HIGH BLOOD PRESSURE   Have your blood pressure checked at least every 1-2 years. High blood pressure causes heart disease and increases the risk of stroke.  If you are between 1 years and 61 years old, ask your health care provider if you should take aspirin to prevent strokes.  Have regular diabetes screenings. This involves taking a blood sample to check your fasting blood sugar level.  If you are at a normal weight and have a low risk for diabetes, have this test once every three years after 67 years of age.  If you are overweight and have a high risk for diabetes, consider being tested at a younger age or more  often. PREVENTING INFECTION  Hepatitis B  If you have a higher risk for hepatitis B, you should be screened for this virus. You are considered at high risk for hepatitis B if:  You were born in a country where hepatitis B is common. Ask your health care provider which countries are considered high risk.  Your parents were born in a high-risk country, and you have not been immunized against hepatitis B (hepatitis B vaccine).  You have HIV or AIDS.  You use needles to inject street drugs.  You live with someone who has hepatitis B.  You have had sex with someone who has hepatitis B.  You get hemodialysis treatment.  You take certain medicines for conditions, including cancer, organ transplantation, and autoimmune conditions. Hepatitis C  Blood testing is recommended for:  Everyone born from [birth year] through 1963.  Anyone with known risk factors for hepatitis C. Sexually transmitted infections (STIs)  You should be screened for sexually transmitted infections (STIs) including gonorrhea and chlamydia if:  You are sexually active and are younger than 67 years of age.  You are older than 67 years of age and your health care provider tells you that you are at risk for this type of infection.  Your sexual activity has changed since you were last screened and you are at an increased risk for chlamydia or gonorrhea. Ask your health care provider if you are at risk.  If you do not have HIV, but are at risk, it may be recommended that you take a prescription medicine daily to prevent HIV infection. This is called pre-exposure prophylaxis (PrEP). You are considered at risk if:  You are sexually active and do not regularly use condoms or know the HIV status of your partner(s).  You take drugs by injection.  You are sexually active with a partner who has HIV. Talk with your health care provider about whether you are at high risk of being infected with HIV. If you choose to begin PrEP, you  should first be tested for HIV. You should then be tested every 3 months for as long as you are taking PrEP.  PREGNANCY   If you are premenopausal and you may become pregnant, ask your health care provider about preconception counseling.  If you may become pregnant, take 400 to 800 micrograms (mcg) of folic acid every  day.  If you want to prevent pregnancy, talk to your health care provider about birth control (contraception). OSTEOPOROSIS AND MENOPAUSE   Osteoporosis is a disease in which the bones lose minerals and strength with aging. This can result in serious bone fractures. Your risk for osteoporosis can be identified using a bone density scan.  If you are 5 years of age or older, or if you are at risk for osteoporosis and fractures, ask your health care provider if you should be screened.  Ask your health care provider whether you should take a calcium or vitamin D supplement to lower your risk for osteoporosis.  Menopause may have certain physical symptoms and risks.  Hormone replacement therapy may reduce some of these symptoms and risks. Talk to your health care provider about whether hormone replacement therapy is right for you.  HOME CARE INSTRUCTIONS   Schedule regular health, dental, and eye exams.  Stay current with your immunizations.   Do not use any tobacco products including cigarettes, chewing tobacco, or electronic cigarettes.  If you are pregnant, do not drink alcohol.  If you are breastfeeding, limit how much and how often you drink alcohol.  Limit alcohol intake to no more than 1 drink per day for nonpregnant women. One drink equals 12 ounces of beer, 5 ounces of wine, or 1 ounces of hard liquor.  Do not use street drugs.  Do not share needles.  Ask your health care provider for help if you need support or information about quitting drugs.  Tell your health care provider if you often feel depressed.  Tell your health care provider if you have ever  been abused or do not feel safe at home. Document Released: 06/05/2011 Document Revised: 04/06/2014 Document Reviewed: 10/22/2013 [facility] Patient Information 2015 [place], [place]. This information is not intended to replace advice given to you by your health care provider. Make sure you discuss any questions you have with your health care provider.

## 2015-07-13 NOTE — Assessment & Plan Note (Addendum)
Isolated episode , lasted for a for  Few hours about a month ago.  Still has ovaries and uterus and has history of fbroids,  Has adnexal fullness on right side.  Ultrasound and CA 125 ordered

## 2015-07-13 NOTE — Progress Notes (Signed)
Patient ID: Michele Fernandez, female    DOB: 02-12-1946  Age: 69 y.o. MRN: [id number]  The patient is here for annual Medicare wellness examination and management of other chronic and acute problems.   Has mammogram scheduled at [facility] by Job Founds in September The risk factors are reflected in the social history.  The roster of all physicians providing medical care to patient - is listed in the Snapshot section of the chart.  Activities of daily living:  The patient is 100% independent in all ADLs: dressing, toileting, feeding as well as independent mobility  Home safety : The patient has smoke detectors in the home. They wear seatbelts.  There are no firearms at home. There is no violence in the home.   There is no risks for hepatitis, STDs or HIV. There is no   history of blood transfusion. They have no travel history to infectious disease endemic areas of the world.  The patient has seen their dentist in the last six month. They have seen their eye doctor in the last year. They admit to slight hearing difficulty with regard to whispered voices and some television programs.  They have deferred audiologic testing in the last year.  They do not  have excessive sun exposure. Discussed the need for sun protection: hats, long sleeves and use of sunscreen if there is significant sun exposure.   Diet: the importance of a healthy diet is discussed. They do have a healthy diet.  The benefits of regular aerobic exercise were discussed. She walks 4 times per week ,  20 minutes.   Depression screen: there are no signs or vegative symptoms of depression- irritability, change in appetite, anhedonia, sadness/tearfullness.  Cognitive assessment: the patient manages all their financial and personal affairs and is actively engaged. They could relate day,date,year and events; recalled 2/3 objects at 3 minutes; performed clock-face test normally.  The following portions of the patient's history were  reviewed and updated as appropriate: allergies, current medications, past family history, past medical history,  past surgical history, past social history  and problem list.  Visual acuity was not assessed per patient preference since she has regular follow up with her ophthalmologist. Hearing and body mass index were assessed and reviewed.   During the course of the visit the patient was educated and counseled about appropriate screening and preventive services including : fall prevention , diabetes screening, nutrition counseling, colorectal cancer screening, and recommended immunizations.    CC: The primary encounter diagnosis was Medicare annual wellness visit, subsequent. 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 She has been having vague pelvic pain that is reminiscent to menstrual cramps and is not accompanied by vaginal discharge, urinary frequency or change in bowel habits.  She has been tolerating her meds for hypertension without issue.   She has a small linear rash on forearm that she bdeveloped after contact with poison ivy.  Pruritic, papular.    History Michele Fernandez has a past medical history of GERD (gastroesophageal reflux disease) and Other sign and symptom in breast.   She has past surgical history that includes Breast biopsy (Bilateral, 1993); Breast biopsy (Left, 1989); Upper gi endoscopy (2006); Hammer toe surgery (2006); Foot surgery (2009, 2012); Cerebral aneurysm repair (1991); and Colonoscopy (2014).   Her family history  includes Alcohol abuse in her son; Cancer in her brother and maternal aunt; Heart attack in her maternal grandmother.She reports that she quit smoking about 42 years ago. Her smoking use included  Cigarettes. She has a 15 pack-year smoking history. She has never used smokeless tobacco. She reports that she does not drink alcohol or use illicit drugs.  Outpatient Prescriptions Prior to Visit  Medication Sig Dispense Refill  . (No Medication Selected) Apply 1 application topically. Apply vaginally two to three times weekly. Progesterone 2% in Versa and Estriol 1 mg vaginal tab. Must call pharmacy for compound.    . butalbital-acetaminophen-caffeine (ESGIC) 50-325-40 MG per tablet Take 1 tablet by mouth daily as needed.    . cyclobenzaprine (FLEXERIL) 5 MG tablet Take 1 tablet (5 mg total) by mouth 3 (three) times daily as needed for muscle spasms. 90 tablet 2  . losartan (COZAAR) 50 MG tablet Take 1 tablet (50 mg total) by mouth daily. 90 tablet 0  . magnesium oxide (MAG-OX) 400 MG tablet Take 400 mg by mouth daily as needed.    . Calcium Carb-Cholecalciferol (HM CALCIUM-VITAMIN D) 600-400 MG-UNIT TABS Take 1 tablet by mouth daily.    . Multiple Vitamins-Minerals (CENTRUM PO) Take by mouth daily.    . Multiple Vitamins-Minerals (IPRIFLAVONE OSTEO FORMULA PO) Take 1 tablet by mouth daily.     No facility-administered medications prior to visit.    Review of Systems   Patient denies headache, fevers, malaise, unintentional weight loss, , eye pain, sinus congestion and sinus pain, sore throat, dysphagia,  hemoptysis , cough, dyspnea, wheezing, chest pain, palpitations, orthopnea, edema, abdominal pain, nausea, melena, diarrhea, constipation, flank pain, dysuria, hematuria, urinary  Frequency, nocturia, numbness, tingling, seizures,  Focal weakness, Loss of consciousness,  Tremor, insomnia, depression, anxiety, and suicidal ideation.      Objective:  BP 150/78 mmHg  Pulse 67  Temp(Src) 97.7 F (36.5 C) (Oral)  Resp 14  Ht 5\' 1"  (1.549 m)  Wt 154 lb 4 oz (69.967 kg)  BMI 29.16 kg/m2  SpO2 98%  Physical Exam  General Appearance:    Alert, cooperative, no distress, appears stated age   Head:    Normocephalic, without obvious abnormality, atraumatic  Eyes:    PERRL, conjunctiva/corneas clear, EOM's intact, fundi    benign, both eyes  Ears:    Normal TM's and external ear canals, both ears  Nose:   Nares normal, septum midline, mucosa normal, no drainage    or sinus tenderness  Throat:   Lips, mucosa, and tongue normal; teeth and gums normal  Neck:   Supple, symmetrical, trachea midline, no adenopathy;    thyroid:  no enlargement/tenderness/nodules; no carotid   bruit or JVD  Back:     Symmetric, no curvature, ROM normal, no CVA tenderness  Lungs:     Clear to auscultation bilaterally, respirations unlabored  Chest Wall:    No tenderness or deformity   Heart:    Regular rate and rhythm, S1 and S2 normal, no murmur, rub   or gallop  Breast Exam:    No tenderness, masses, or nipple abnormality  Abdomen:     Soft, non-tender, bowel sounds active all four quadrants,    no masses, no organomegaly  Genitalia:    Pelvic: cervix normal in appearance, external genitalia normal, some fullness on right adnexal region, in area of  tenderness, no cervical motion tenderness, rectovaginal septum normal, uterus normal size, shape, and consistency and vagina normal without discharge  Extremities:  Extremities normal, atraumatic, no cyanosis or edema  Pulses:   2+ and symmetric all extremities  Skin:   Skin color, texture, turgor normal,2 cm linear papular rash on right forearm   Lymph nodes:   Cervical, supraclavicular, and axillary nodes normal  Neurologic:   CNII-XII intact, normal strength, sensation and reflexes    throughout      Assessment & Plan:   Problem List Items Addressed This Visit      Unprioritized   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       Pelvic pain in female    Isolated episode , lasted for a for  Few hours about a month ago.  Still has ovaries and  uterus and has history of fbroids,  Has adnexal fullness on right side.  Ultrasound and CA 125 ordered       Relevant Orders   CA 125   Contact dermatitis and eczema due to plant    Triamcinolone cream offered if otc HC cream does not resolve.      Medicare annual wellness visit, subsequent - Primary    Annual Medicare wellness  exam was done as well as a comprehensive physical exam and management of acute and chronic conditions .  During the course of the visit the patient was educated and counseled about appropriate screening and preventive services including : fall prevention , diabetes screening, nutrition counseling, colorectal cancer screening, and recommended immunizations.  Printed recommendations for health maintenance screenings was given.        Other Visit Diagnoses    Abnormal female pelvic exam        Relevant Orders    US Pelvis Complete    CA 125    Other fatigue        Relevant Orders    CBC with Differential/Platelet    TSH    Screen for STD (sexually transmitted disease)        Relevant Orders    Hepatitis C antibody    HIV antibody       I have discontinued Ms. Harmon's Multiple Vitamins-Minerals (CENTRUM PO), Calcium Carb-Cholecalciferol, and Multiple Vitamins-Minerals (IPRIFLAVONE OSTEO FORMULA PO). I am also having her maintain her magnesium oxide, butalbital-acetaminophen-caffeine, cyclobenzaprine, (No Medication Selected), and losartan.  No orders of the defined types were placed in this encounter.    Medications Discontinued During This Encounter  Medication Reason  . Calcium Carb-Cholecalciferol (HM CALCIUM-VITAMIN D) 600-400 MG-UNIT TABS Patient Preference  . Multiple Vitamins-Minerals (CENTRUM PO) Patient Preference  . Multiple Vitamins-Minerals (IPRIFLAVONE OSTEO FORMULA PO) Error    Follow-up: No Follow-up on file.   Crecencio Mc, MD

## 2015-07-14 ENCOUNTER — Other Ambulatory Visit: Payer: Self-pay | Admitting: Internal Medicine

## 2015-07-14 DIAGNOSIS — R102 Pelvic and perineal pain: Secondary | ICD-10-CM

## 2015-07-14 DIAGNOSIS — Z0000 Encounter for general adult medical examination without abnormal findings: Secondary | ICD-10-CM | POA: Insufficient documentation

## 2015-07-14 DIAGNOSIS — Z01411 Encounter for gynecological examination (general) (routine) with abnormal findings: Secondary | ICD-10-CM

## 2015-07-14 NOTE — Assessment & Plan Note (Signed)
Triamcinolone cream offered if otc HC cream does not resolve.

## 2015-07-14 NOTE — Assessment & Plan Note (Signed)
PAP smear repeated today

## 2015-07-14 NOTE — Assessment & Plan Note (Signed)

## 2015-07-21 ENCOUNTER — Ambulatory Visit
Admission: RE | Admit: 2015-07-21 | Discharge: 2015-07-21 | Disposition: A | Discharge status: Home / Self Care | Payer: Medicare Other | Source: Ambulatory Visit | Attending: Internal Medicine | Admitting: Internal Medicine

## 2015-07-21 DIAGNOSIS — R938 Abnormal findings on diagnostic imaging of other specified body structures: Secondary | ICD-10-CM | POA: Diagnosis present

## 2015-07-21 DIAGNOSIS — D259 Leiomyoma of uterus, unspecified: Secondary | ICD-10-CM | POA: Insufficient documentation

## 2015-07-21 DIAGNOSIS — R102 Pelvic and perineal pain: Secondary | ICD-10-CM | POA: Diagnosis present

## 2015-07-21 DIAGNOSIS — Z01411 Encounter for gynecological examination (general) (routine) with abnormal findings: Secondary | ICD-10-CM

## 2015-07-22 ENCOUNTER — Other Ambulatory Visit (INDEPENDENT_AMBULATORY_CARE_PROVIDER_SITE_OTHER): Payer: Medicare Other

## 2015-07-22 DIAGNOSIS — R102 Pelvic and perineal pain: Secondary | ICD-10-CM

## 2015-07-22 DIAGNOSIS — Z113 Encounter for screening for infections with a predominantly sexual mode of transmission: Secondary | ICD-10-CM

## 2015-07-22 DIAGNOSIS — R5383 Other fatigue: Secondary | ICD-10-CM | POA: Diagnosis not present

## 2015-07-22 DIAGNOSIS — I10 Essential (primary) hypertension: Secondary | ICD-10-CM

## 2015-07-22 DIAGNOSIS — Z01411 Encounter for gynecological examination (general) (routine) with abnormal findings: Secondary | ICD-10-CM

## 2015-07-22 LAB — CBC WITH DIFFERENTIAL/PLATELET
BASOS ABS: 0 10*3/uL (ref 0.0–0.1)
Basophils Relative: 0.6 % (ref 0.0–3.0)
EOS ABS: 0.1 10*3/uL (ref 0.0–0.7)
Eosinophils Relative: 1.4 % (ref 0.0–5.0)
HEMATOCRIT: 42.6 % (ref 36.0–46.0)
Hemoglobin: 14.1 g/dL (ref 12.0–15.0)
LYMPHS PCT: 33.4 % (ref 12.0–46.0)
Lymphs Abs: 2 10*3/uL (ref 0.7–4.0)
MCHC: 33.2 g/dL (ref 30.0–36.0)
MCV: 88.2 fl (ref 78.0–100.0)
MONO ABS: 0.4 10*3/uL (ref 0.1–1.0)
Monocytes Relative: 6.5 % (ref 3.0–12.0)
NEUTROS ABS: 3.5 10*3/uL (ref 1.4–7.7)
NEUTROS PCT: 58.1 % (ref 43.0–77.0)
PLATELETS: 267 10*3/uL (ref 150.0–400.0)
RBC: 4.83 Mil/uL (ref 3.87–5.11)
RDW: 13.7 % (ref 11.5–15.5)
WBC: 6 10*3/uL (ref 4.0–10.5)

## 2015-07-22 LAB — COMPREHENSIVE METABOLIC PANEL
ALT: 15 U/L (ref 0–35)
AST: 17 U/L (ref 0–37)
Albumin: 3.9 g/dL (ref 3.5–5.2)
Alkaline Phosphatase: 52 U/L (ref 39–117)
BILIRUBIN TOTAL: 0.5 mg/dL (ref 0.2–1.2)
BUN: 15 mg/dL (ref 6–23)
CALCIUM: 8.9 mg/dL (ref 8.4–10.5)
CO2: 27 meq/L (ref 19–32)
CREATININE: 0.78 mg/dL (ref 0.40–1.20)
Chloride: 110 mEq/L (ref 96–112)
GFR: 78.34 mL/min (ref >60.00)
GLUCOSE: 87 mg/dL (ref 70–99)
Potassium: 4.5 mEq/L (ref 3.5–5.1)
SODIUM: 142 meq/L (ref 135–145)
Total Protein: 6.3 g/dL (ref 6.0–8.3)

## 2015-07-22 LAB — LIPID PANEL
CHOL/HDL RATIO: 4
Cholesterol: 205 mg/dL — ABNORMAL HIGH (ref 0–200)
HDL: 53.5 mg/dL (ref >39.00)
LDL CALC: 139 mg/dL — AB (ref 0–99)
NONHDL: 151.74
Triglycerides: 64 mg/dL (ref 0.0–149.0)
VLDL: 12.8 mg/dL (ref 0.0–40.0)

## 2015-07-22 LAB — TSH: TSH: 1.58 u[IU]/mL (ref 0.35–4.50)

## 2015-07-23 ENCOUNTER — Encounter: Payer: Self-pay | Admitting: Internal Medicine

## 2015-07-23 LAB — HEPATITIS C ANTIBODY: HCV AB: NEGATIVE

## 2015-07-23 LAB — CA 125: CA 125: 13 U/mL (ref <35)

## 2015-07-23 LAB — HIV ANTIBODY (ROUTINE TESTING W REFLEX): HIV: NONREACTIVE

## 2015-08-26 ENCOUNTER — Encounter: Payer: Self-pay | Admitting: General Surgery

## 2015-08-26 ENCOUNTER — Other Ambulatory Visit: Payer: Self-pay | Admitting: General Surgery

## 2015-08-26 ENCOUNTER — Other Ambulatory Visit: Payer: Medicare Other

## 2015-08-26 ENCOUNTER — Ambulatory Visit (INDEPENDENT_AMBULATORY_CARE_PROVIDER_SITE_OTHER): Payer: Medicare Other | Admitting: General Surgery

## 2015-08-26 VITALS — BP 128/77 | HR 65 | Resp 14 | Ht 61.0 in | Wt 153.0 lb

## 2015-08-26 DIAGNOSIS — N63 Unspecified lump in breast: Secondary | ICD-10-CM

## 2015-08-26 DIAGNOSIS — N6009 Solitary cyst of unspecified breast: Secondary | ICD-10-CM | POA: Insufficient documentation

## 2015-08-26 DIAGNOSIS — N6001 Solitary cyst of right breast: Secondary | ICD-10-CM | POA: Diagnosis not present

## 2015-08-26 DIAGNOSIS — N6310 Unspecified lump in the right breast, unspecified quadrant: Secondary | ICD-10-CM

## 2015-08-26 NOTE — Patient Instructions (Addendum)
We will call with cytology results.

## 2015-08-26 NOTE — Progress Notes (Addendum)
Patient ID: Michele Fernandez, female   DOB: 1946/10/29, 69 y.o.   MRN: [id number]  Chief Complaint  Patient presents with  . Follow-up    Mammogram    HPI Michele Fernandez is a 69 y.o. female who presents for a breast evaluation. The most recent mammogram was done on 08/19/15. Patient does perform regular self breast checks and gets regular mammograms done. She denies any new breast problems.    HPI  Past Medical History  Diagnosis Date  . GERD (gastroesophageal reflux disease)   . Other sign and symptom in breast     Past Surgical History  Procedure Laterality Date  . Breast biopsy Bilateral 1993  . Breast biopsy Left 1989  . Upper gi endoscopy  2006  . Hammer toe surgery  2006  . Foot surgery  2009, 2012  . Cerebral aneurysm repair  1991  . Colonoscopy  2014    [facility]  . Eye surgery Right 11/2014    Family History  Problem Relation Age of Onset  . Alcohol abuse Son   . Cancer Maternal Aunt     ovarian  . Heart attack Maternal Grandmother   . Cancer Brother     lung ca,   sepsis pneumonia    Social History Social History  Substance Use Topics  . Smoking status: Former Smoker -- 1.00 packs/day for 15 years    Types: Cigarettes    Quit date: 12/10/1972  . Smokeless tobacco: Never Used  . Alcohol Use: No    Allergies  Allergen Reactions  . Biaxin [Clarithromycin] Nausea Only    Metallic taste  . Demerol [Meperidine] Nausea And Vomiting    IV Demerol Only  . Vancomycin Rash    Current Outpatient Prescriptions  Medication Sig Dispense Refill  . (No Medication Selected) Apply 1 application topically. Apply vaginally two to three times weekly. Progesterone 2% in Versa and Estriol 1 mg vaginal tab. Must call pharmacy for compound.    . butalbital-acetaminophen-caffeine (ESGIC) 50-325-40 MG per tablet Take 1 tablet by mouth daily as needed.    . cyclobenzaprine (FLEXERIL) 5 MG tablet Take 1 tablet (5 mg total) by mouth 3 (three) times daily as  needed for muscle spasms. 90 tablet 2  . losartan (COZAAR) 50 MG tablet Take 1 tablet (50 mg total) by mouth daily. 90 tablet 0  . magnesium oxide (MAG-OX) 400 MG tablet Take 400 mg by mouth daily as needed.     No current facility-administered medications for this visit.    Review of Systems Review of Systems  Constitutional: Negative.   Respiratory: Negative.   Cardiovascular: Negative.     Blood pressure 128/77, pulse 65, resp. rate 14, height 5\' 1"  (1.549 m), weight 153 lb (69.4 kg).  Physical Exam Physical Exam  Constitutional: She is oriented to person, place, and time. She appears well-developed and well-nourished.  Eyes: Conjunctivae are normal. No scleral icterus.  Neck: Neck supple.  Cardiovascular: Normal rate, regular rhythm and normal heart sounds.   Pulmonary/Chest: Effort normal and breath sounds normal. Right breast exhibits no inverted nipple, no mass, no nipple discharge, no skin change and no tenderness. Left breast exhibits no inverted nipple, no mass, no nipple discharge, no skin change and no tenderness.    Lymphadenopathy:    She has no cervical adenopathy.  Neurological: She is alert and oriented to person, place, and time.  Skin: Skin is warm and dry.  Psychiatric: She has a normal mood and affect.  Data Reviewed Bilateral screening mammogram dated 08/19/2015 showed no areas of concern. Bilateral round and oval mass is reported stable back to 2009.  The nodular areas reported appeared mildly more prominent on today's interval review, and while this may have become more obvious based on resolution of the adjacent breast parenchyma it was elected completed an ultrasound to assess them.  Ultrasound examination of the right breast in the 12:00 position showed 2 lesions corresponding to those identified on mammography. These were at the 12:00 position, 3 cm from the nipple. The more superficial lesion measured 0.37 x 0.61 x 0.6 cm. This was smoothly  marginated with faint posterior acoustic enhancement. A single soft lobulation was noted. This had the appearance of a simple cyst.  The lesion deeper to this again at the 12:00 position was hypoechoic and showed minimal posterior acoustic enhancement. This measured 0.54 x 0.66 x 0.97 cm. As was an indeterminate lesion. BI-RADS-3.  The patient was amenable to aspiration/FNA of the 2 noted lesions. Using 1 mL of 1% plain Xylocaine the superficial lesion was aspirated with complete resolution. This fluid was discarded. The deeper lesion was sampled by FNA with multiple passes through the lesion. Slides 4 were prepared.  Assessment    Right breast nodularity, slightly more prominent than 2015.    Plan    The patient will be contacted when the FNA results are available. Assuming these are benign, we'll arrange for a follow-up exam in one year with screening mammograms at that time per her request.    PCP:  Wende Mott 08/26/2015, 8:41 PM

## 2015-08-27 ENCOUNTER — Telehealth: Payer: Self-pay | Admitting: General Surgery

## 2015-08-27 NOTE — Telephone Encounter (Signed)
Notified path benign. F/U one year as planned.

## 2015-09-09 ENCOUNTER — Encounter: Payer: Self-pay | Admitting: *Deleted

## 2015-09-12 NOTE — H&P (Signed)
See scanned H&P

## 2015-09-13 ENCOUNTER — Encounter
Admission: RE | Disposition: A | Discharge status: Home / Self Care | Payer: Self-pay | Source: Ambulatory Visit | Attending: Ophthalmology

## 2015-09-13 ENCOUNTER — Ambulatory Visit: Payer: Medicare Other | Admitting: *Deleted

## 2015-09-13 ENCOUNTER — Ambulatory Visit
Admission: RE | Admit: 2015-09-13 | Discharge: 2015-09-13 | Disposition: A | Discharge status: Home / Self Care | Payer: Medicare Other | Source: Ambulatory Visit | Attending: Ophthalmology | Admitting: Ophthalmology

## 2015-09-13 ENCOUNTER — Encounter: Payer: Self-pay | Admitting: *Deleted

## 2015-09-13 DIAGNOSIS — Z87891 Personal history of nicotine dependence: Secondary | ICD-10-CM | POA: Diagnosis not present

## 2015-09-13 DIAGNOSIS — Z881 Allergy status to other antibiotic agents status: Secondary | ICD-10-CM | POA: Diagnosis not present

## 2015-09-13 DIAGNOSIS — M810 Age-related osteoporosis without current pathological fracture: Secondary | ICD-10-CM | POA: Diagnosis not present

## 2015-09-13 DIAGNOSIS — Z79899 Other long term (current) drug therapy: Secondary | ICD-10-CM | POA: Diagnosis not present

## 2015-09-13 DIAGNOSIS — Z85828 Personal history of other malignant neoplasm of skin: Secondary | ICD-10-CM | POA: Diagnosis not present

## 2015-09-13 DIAGNOSIS — H2512 Age-related nuclear cataract, left eye: Secondary | ICD-10-CM | POA: Insufficient documentation

## 2015-09-13 DIAGNOSIS — K219 Gastro-esophageal reflux disease without esophagitis: Secondary | ICD-10-CM | POA: Diagnosis not present

## 2015-09-13 DIAGNOSIS — Z885 Allergy status to narcotic agent status: Secondary | ICD-10-CM | POA: Diagnosis not present

## 2015-09-13 DIAGNOSIS — I10 Essential (primary) hypertension: Secondary | ICD-10-CM | POA: Diagnosis not present

## 2015-09-13 HISTORY — PX: CATARACT EXTRACTION W/PHACO: SHX586

## 2015-09-13 HISTORY — DX: Malignant (primary) neoplasm, unspecified: C80.1

## 2015-09-13 HISTORY — DX: Essential (primary) hypertension: I10

## 2015-09-13 SURGERY — PHACOEMULSIFICATION, CATARACT, WITH IOL INSERTION
Anesthesia: Monitor Anesthesia Care | Site: Eye | Laterality: Left | Wound class: Clean

## 2015-09-13 MED ORDER — CEFUROXIME OPHTHALMIC INJECTION 1 MG/0.1 ML
INJECTION | OPHTHALMIC | Status: AC
  Filled 2015-09-13: qty 0.1

## 2015-09-13 MED ORDER — PHENYLEPHRINE HCL 10 % OP SOLN
OPHTHALMIC | Status: AC
  Administered 2015-09-13: 1 [drp] via OPHTHALMIC
  Filled 2015-09-13: qty 5

## 2015-09-13 MED ORDER — EPINEPHRINE HCL 1 MG/ML IJ SOLN
INTRAMUSCULAR | Status: AC
  Filled 2015-09-13: qty 2

## 2015-09-13 MED ORDER — HYALURONIDASE HUMAN 150 UNIT/ML IJ SOLN
INTRAMUSCULAR | Status: AC
  Filled 2015-09-13: qty 1

## 2015-09-13 MED ORDER — CARBACHOL 0.01 % IO SOLN
INTRAOCULAR | Status: DC | PRN
  Administered 2015-09-13: 0.5 mL via INTRAOCULAR

## 2015-09-13 MED ORDER — LIDOCAINE HCL (PF) 4 % IJ SOLN
INTRAMUSCULAR | Status: DC | PRN
  Administered 2015-09-13: 5 mL via OPHTHALMIC

## 2015-09-13 MED ORDER — MOXIFLOXACIN HCL 0.5 % OP SOLN
OPHTHALMIC | Status: DC | PRN
  Administered 2015-09-13: 1 [drp] via OPHTHALMIC

## 2015-09-13 MED ORDER — CYCLOPENTOLATE HCL 2 % OP SOLN
OPHTHALMIC | Status: AC
  Administered 2015-09-13: 1 [drp] via OPHTHALMIC
  Filled 2015-09-13: qty 2

## 2015-09-13 MED ORDER — CEFUROXIME OPHTHALMIC INJECTION 1 MG/0.1 ML
INJECTION | OPHTHALMIC | Status: DC | PRN
  Administered 2015-09-13: 0.1 mL via OPHTHALMIC

## 2015-09-13 MED ORDER — MOXIFLOXACIN HCL 0.5 % OP SOLN
OPHTHALMIC | Status: AC
  Administered 2015-09-13: 1 [drp] via OPHTHALMIC
  Filled 2015-09-13: qty 3

## 2015-09-13 MED ORDER — SODIUM CHLORIDE 0.9 % IV SOLN
INTRAVENOUS | Status: DC
  Administered 2015-09-13: 10:00:00 via INTRAVENOUS

## 2015-09-13 MED ORDER — MIDAZOLAM HCL 2 MG/2ML IJ SOLN
INTRAMUSCULAR | Status: DC | PRN
  Administered 2015-09-13 (×4): 1 mg via INTRAVENOUS

## 2015-09-13 MED ORDER — LIDOCAINE HCL (PF) 4 % IJ SOLN
INTRAOCULAR | Status: DC | PRN
  Administered 2015-09-13: 4 mL via OPHTHALMIC

## 2015-09-13 MED ORDER — PHENYLEPHRINE HCL 10 % OP SOLN
1.0000 [drp] | OPHTHALMIC | Status: AC | PRN
  Administered 2015-09-13 (×4): 1 [drp] via OPHTHALMIC

## 2015-09-13 MED ORDER — NA CHONDROIT SULF-NA HYALURON 40-17 MG/ML IO SOLN
INTRAOCULAR | Status: DC | PRN
  Administered 2015-09-13: 1 mL via INTRAOCULAR

## 2015-09-13 MED ORDER — LIDOCAINE HCL (PF) 4 % IJ SOLN
INTRAMUSCULAR | Status: AC
  Filled 2015-09-13: qty 5

## 2015-09-13 MED ORDER — TETRACAINE HCL 0.5 % OP SOLN
OPHTHALMIC | Status: DC | PRN
Start: 2015-09-13 — End: 2015-09-13
  Administered 2015-09-13: 2 [drp] via OPHTHALMIC

## 2015-09-13 MED ORDER — ALFENTANIL 500 MCG/ML IJ INJ
INJECTION | INTRAMUSCULAR | Status: DC | PRN
  Administered 2015-09-13: 750 ug via INTRAVENOUS

## 2015-09-13 MED ORDER — TETRACAINE HCL 0.5 % OP SOLN
OPHTHALMIC | Status: AC
  Filled 2015-09-13: qty 2

## 2015-09-13 MED ORDER — MOXIFLOXACIN HCL 0.5 % OP SOLN
1.0000 [drp] | OPHTHALMIC | Status: AC | PRN
  Administered 2015-09-13 (×3): 1 [drp] via OPHTHALMIC

## 2015-09-13 MED ORDER — NA CHONDROIT SULF-NA HYALURON 40-17 MG/ML IO SOLN
INTRAOCULAR | Status: AC
  Filled 2015-09-13: qty 1

## 2015-09-13 MED ORDER — BUPIVACAINE HCL (PF) 0.75 % IJ SOLN
INTRAMUSCULAR | Status: AC
  Filled 2015-09-13: qty 10

## 2015-09-13 MED ORDER — EPINEPHRINE HCL 1 MG/ML IJ SOLN
INTRAOCULAR | Status: DC | PRN
  Administered 2015-09-13: 200 mL via OPHTHALMIC

## 2015-09-13 MED ORDER — CYCLOPENTOLATE HCL 2 % OP SOLN
1.0000 [drp] | OPHTHALMIC | Status: AC | PRN
  Administered 2015-09-13 (×4): 1 [drp] via OPHTHALMIC

## 2015-09-13 SURGICAL SUPPLY — 30 items
CANNULA ANT/CHMB 27G (MISCELLANEOUS) ×1 IMPLANT
CANNULA ANT/CHMB 27GA (MISCELLANEOUS) ×2 IMPLANT
CORD BIP STRL DISP 12FT (MISCELLANEOUS) ×2 IMPLANT
CUP MEDICINE 2OZ PLAST GRAD ST (MISCELLANEOUS) ×2 IMPLANT
DRAPE XRAY CASSETTE 23X24 (DRAPES) ×2 IMPLANT
ERASER HMR WETFIELD 18G (MISCELLANEOUS) ×2 IMPLANT
GLOVE BIO SURGEON STRL SZ8 (GLOVE) ×2 IMPLANT
GLOVE SURG LX 6.5 MICRO (GLOVE) ×1
GLOVE SURG LX 8.0 MICRO (GLOVE) ×1
GLOVE SURG LX STRL 6.5 MICRO (GLOVE) ×1 IMPLANT
GLOVE SURG LX STRL 8.0 MICRO (GLOVE) ×1 IMPLANT
GOWN STRL REUS W/ TWL LRG LVL3 (GOWN DISPOSABLE) ×1 IMPLANT
GOWN STRL REUS W/ TWL XL LVL3 (GOWN DISPOSABLE) ×1 IMPLANT
GOWN STRL REUS W/TWL LRG LVL3 (GOWN DISPOSABLE) ×2
GOWN STRL REUS W/TWL XL LVL3 (GOWN DISPOSABLE) ×2
LENS IOL ACRYSOF IQ 19.5 (Intraocular Lens) ×1 IMPLANT
PACK CATARACT (MISCELLANEOUS) ×2 IMPLANT
PACK CATARACT DINGLEDEIN LX (MISCELLANEOUS) ×2 IMPLANT
PACK EYE AFTER SURG (MISCELLANEOUS) ×2 IMPLANT
SHLD EYE VISITEC  UNIV (MISCELLANEOUS) ×2 IMPLANT
SOL BSS BAG (MISCELLANEOUS) ×2
SOL PREP PVP 2OZ (MISCELLANEOUS) ×2
SOLUTION BSS BAG (MISCELLANEOUS) ×1 IMPLANT
SOLUTION PREP PVP 2OZ (MISCELLANEOUS) ×1 IMPLANT
SUT SILK 5-0 (SUTURE) ×2 IMPLANT
SYR 3ML LL SCALE MARK (SYRINGE) ×2 IMPLANT
SYR 5ML LL (SYRINGE) ×2 IMPLANT
SYR TB 1ML 27GX1/2 LL (SYRINGE) ×2 IMPLANT
WATER STERILE IRR 1000ML POUR (IV SOLUTION) ×2 IMPLANT
WIPE NON LINTING 3.25X3.25 (MISCELLANEOUS) ×2 IMPLANT

## 2015-09-13 NOTE — Anesthesia Postprocedure Evaluation (Signed)
  Anesthesia Post-op Note  Patient: Michele Fernandez  Procedure(s) Performed: Procedure(s) with comments: CATARACT EXTRACTION PHACO AND INTRAOCULAR LENS PLACEMENT (IOC) (Left) - EBR#[id number] H US:01:22.4 AP%:26.1 CDE:37.12  Anesthesia type:MAC  Patient location: PACU  Post pain: Pain level controlled  Post assessment: Post-op Vital signs reviewed, Patient's Cardiovascular Status Stable, Respiratory Function Stable, Patent Airway and No signs of Nausea or vomiting  Post vital signs: Reviewed and stable  Last Vitals:  Filed Vitals:   09/13/15 1222  BP: 124/65  Pulse: 72  Temp:   Resp: 16    Level of consciousness: awake, alert  and patient cooperative  Complications: No apparent anesthesia complications

## 2015-09-13 NOTE — Op Note (Signed)
Date of Surgery: 09/13/2015 Date of Dictation: 09/13/2015 12:11 PM Pre-operative Diagnosis:  Nuclear Sclerotic Cataract left Eye Post-operative Diagnosis: same Procedure performed: Extra-capsular Cataract Extraction (ECCE) with placement of a posterior chamber intraocular lens (IOL) left Eye IOL:  Implant Name Type Inv. Item Serial No. Manufacturer Lot No. LRB No. Used  LENS IOL ACRYSOF IQ 19.5 - [id number] Intraocular Lens LENS IOL ACRYSOF IQ 19.5 [id number] ALCON [id number] Left 1   Anesthesia: 2% Lidocaine and 4% Marcaine in a 50/50 mixture with 10 unites/ml of Hylenex given as a peribulbar Anesthesiologist: Anesthesiologist: Elyse Hsu, MD CRNA: Allean Found, CRNA Complications: none Estimated Blood Loss: less than 1 ml  Description of procedure:  The patient was given anesthesia and sedation via intravenous access. The patient was then prepped and draped in the usual fashion. A 25-gauge needle was bent for initiating the capsulorhexis. A 5-0 silk suture was placed through the conjunctiva superior and inferiorly to serve as bridle sutures. Hemostasis was obtained at the superior limbus using an eraser cautery. A partial thickness groove was made at the anterior surgical limbus with a 64 Beaver blade and this was dissected anteriorly with an Avaya. The anterior chamber was entered at 10 o'clock with a 1.0 mm paracentesis knife and through the lamellar dissection with a 2.6 mm Alcon keratome. Epi-Shugarcaine 0.5 CC [9 cc BSS Plus (Alcon), 3 cc 4% preservative-free lidocaine (Hospira) and 4 cc 1:1000 preservative-free, bisulfite-free epinephrine] was injected into the anterior chamber via the paracentesis tract. Epi-Shugarcaine 0.5 CC [9 cc BSS Plus (Alcon), 3 cc 4% preservative-free lidocaine (Hospira) and 4 cc 1:1000 preservative-free, bisulfite-free epinephrine] was injected into the anterior chamber via the paracentesis tract. DiscoVisc was injected to replace the aqueous and  a continuous tear curvilinear capsulorhexis was performed using a bent 25-gauge needle.  Balance salt on a syringe was used to perform hydro-dissection and phacoemulsification was carried out using a divide and conquer technique. Procedure(s) with comments: CATARACT EXTRACTION PHACO AND INTRAOCULAR LENS PLACEMENT (IOC) (Left) - TKP#[id number] H US:01:22.4 AP%:26.1 CDE:37.12. Irrigation/aspiration was used to remove the residual cortex and the capsular bag was inflated with DiscoVisc. The intraocular lens was inserted into the capsular bag using a pre-loaded UltraSert Delivery System. Irrigation/aspiration was used to remove the residual DiscoVisc. The wound was inflated with balanced salt and checked for leaks. None were found. Miostat was injected via the paracentesis track and 0.1 ml of cefuroxime containing 1 mg of drug  was injected via the paracentesis track. The wound was checked for leaks again and none were found.   The bridal sutures were removed and two drops of Vigamox were placed on the eye. An eye shield was placed to protect the eye and the patient was discharged to the recovery area in good condition.   Dusty Wagoner MD

## 2015-09-13 NOTE — Interval H&P Note (Signed)
History and Physical Interval Note:  09/13/2015 11:25 AM  Michele Fernandez  has presented today for surgery, with the diagnosis of CATARACT  The various methods of treatment have been discussed with the patient and family. After consideration of risks, benefits and other options for treatment, the patient has consented to  Procedure(s): CATARACT EXTRACTION PHACO AND INTRAOCULAR LENS PLACEMENT ([name]) (Left) as a surgical intervention .  The patient's history has been reviewed, patient examined, no change in status, stable for surgery.  I have reviewed the patient's chart and labs.  Questions were answered to the patient's satisfaction.     Jarita Raval

## 2015-09-13 NOTE — Anesthesia Preprocedure Evaluation (Signed)
Anesthesia Evaluation  Patient identified by MRN, date of birth, ID band Patient awake    Reviewed: Allergy & Precautions, NPO status , Patient's Chart, lab work & pertinent test results  Airway Mallampati: III  TM Distance: >3 FB Neck ROM: Limited    Dental  (+) Teeth Intact   Pulmonary former smoker,    Pulmonary exam normal        Cardiovascular Exercise Tolerance: Good hypertension, Pt. on home beta blockers Normal cardiovascular exam     Neuro/Psych Hx of cerebral aneurysm repair.    GI/Hepatic GERD  Controlled,  Endo/Other    Renal/GU      Musculoskeletal   Abdominal (+)  Abdomen: soft.    Peds  Hematology   Anesthesia Other Findings   Reproductive/Obstetrics                             Anesthesia Physical Anesthesia Plan  ASA: II  Anesthesia Plan: MAC   Post-op Pain Management:    Induction: Intravenous  Airway Management Planned: Nasal Cannula  Additional Equipment:   Intra-op Plan:   Post-operative Plan:   Informed Consent: I have reviewed the patients History and Physical, chart, labs and discussed the procedure including the risks, benefits and alternatives for the proposed anesthesia with the patient or authorized representative who has indicated his/her understanding and acceptance.     Plan Discussed with: CRNA  Anesthesia Plan Comments:         Anesthesia Quick Evaluation

## 2015-09-13 NOTE — Discharge Instructions (Addendum)
See handout Eye Surgery Discharge Instructions  Expect mild scratchy sensation or mild soreness. DO NOT RUB YOUR EYE!  The day of surgery:  Minimal physical activity, but bed rest is not required  No reading, computer work, or close hand work  No bending, lifting, or straining.  May watch TV  For 24 hours:  No driving, legal decisions, or alcoholic beverages  Safety precautions  Eat anything you prefer: It is better to start with liquids, then soup then solid foods.  _____ Eye patch should be worn until postoperative exam tomorrow.  ____ Solar shield eyeglasses should be worn for comfort in the sunlight/patch while sleeping  Resume all regular medications including aspirin or Coumadin if these were discontinued prior to surgery. You may shower, bathe, shave, or wash your hair. Tylenol may be taken for mild discomfort.  Call your doctor if you experience significant pain, nausea, or vomiting, fever > 101 or other signs of infection. [phone] or [phone] Specific instructions:  Follow-up Information    Follow up with DINGELDEIN,STEVEN, MD In 1 day.   Specialty:  Ophthalmology   Why:  October 11 at 10:05am   Contact information:   [place] [address] [phone]     AMBULATORY SURGERY  DISCHARGE INSTRUCTIONS   1) The drugs that you were given will stay in your system until tomorrow so for the next 24 hours you should not:  A) Drive an automobile B) Make any legal decisions C) Drink any alcoholic beverage   2) You may resume regular meals tomorrow.  Today it is better to start with liquids and gradually work up to solid foods.  You may eat anything you prefer, but it is better to start with liquids, then soup and crackers, and gradually work up to solid foods.   3) Please notify your doctor immediately if you have any unusual bleeding, trouble breathing, redness and pain at the surgery site, drainage, fever, or pain not relieved  by medication.    4) Additional Instructions:        Please contact your physician with any problems or Same Day Surgery at [phone], Monday through Friday 6 am to 4 pm, or [place] at [facility] number at [phone].

## 2015-09-13 NOTE — Anesthesia Procedure Notes (Signed)
Procedure Name: MAC Date/Time: 09/13/2015 11:50 AM Performed by: Allean Found Pre-anesthesia Checklist: Patient identified, Emergency Drugs available, Suction available, Patient being monitored and Timeout performed Patient Re-evaluated:Patient Re-evaluated prior to inductionOxygen Delivery Method: Nasal cannula

## 2015-09-13 NOTE — Transfer of Care (Signed)
Immediate Anesthesia Transfer of Care Note  Patient: Michele Fernandez  Procedure(s) Performed: Procedure(s) with comments: CATARACT EXTRACTION PHACO AND INTRAOCULAR LENS PLACEMENT (I[facility]) (Left) - BZX#[id number] H US:01:22.4 AP%:26.1 CDE:37.12  Patient Location: PACU  Anesthesia Type:MAC  Level of Consciousness: awake  Airway & Oxygen Therapy: Patient Spontanous Breathing  Post-op Assessment: Report given to RN  Post vital signs: Reviewed and stable  Last Vitals:  Filed Vitals:   09/13/15 0923  BP: 180/82  Pulse: 80  Temp: 37.5 C  Resp: 16    Complications: No apparent anesthesia complications

## 2015-09-15 ENCOUNTER — Encounter: Payer: Self-pay | Admitting: Internal Medicine

## 2015-09-15 NOTE — Telephone Encounter (Signed)
Area which underwent FNA sampling was noted to be benign ductal cells. This is concordant. A follow-up exam in one year will be planned.

## 2015-10-06 ENCOUNTER — Ambulatory Visit (INDEPENDENT_AMBULATORY_CARE_PROVIDER_SITE_OTHER): Payer: Medicare Other

## 2015-10-06 ENCOUNTER — Ambulatory Visit (INDEPENDENT_AMBULATORY_CARE_PROVIDER_SITE_OTHER): Payer: Medicare Other | Admitting: Podiatry

## 2015-10-06 ENCOUNTER — Encounter: Payer: Self-pay | Admitting: Podiatry

## 2015-10-06 ENCOUNTER — Telehealth: Payer: Self-pay | Admitting: *Deleted

## 2015-10-06 VITALS — BP 140/87 | HR 71 | Resp 18

## 2015-10-06 DIAGNOSIS — R52 Pain, unspecified: Secondary | ICD-10-CM

## 2015-10-06 NOTE — Progress Notes (Signed)
She presents today for surgical consultation regarding second digit of her left foot. She states that the second knuckle just aches and she points to the metaphyseal region of the second metatarsal left foot. She states that the third digit wants to abduct beneath the second toe since the second toe sits up a little bit after the IPJ fusion. She states that the toe hurts beneath the toenail.  Objective: Vital signs are stable she is alert and oriented 3. Pulses are palpable. Radiographs taken today demonstrate well-perfused PIPJ second digit left foot. Screw was also intact of the second metatarsal head.  Assessment painful internal fixations with contracture of the second metatarsophalangeal joint left.  Plan: Martin Majestic over a consent form today regarding removal of internal fixation metatarsal and second toe. Release of the second metatarsophalangeal joint. I answered all questions regarding these procedures best my billing's terms understood it was amenable to assign artery. The consent form. We will place her in a Darco shoe afterwards and I explained to her that she would not need to be out of work for very long.  Roselind Messier DPM

## 2015-10-06 NOTE — Telephone Encounter (Signed)
"  I saw Dr. Milinda Pointer this morning.  I'd like to schedule surgery."  Do you have a date in mind?  "I'd like to have it done by the end of the year."  He can do it on 11/04/2015.  "That date will be fine.  Is there anything else I need to do?"  You will need to register with the surgery center.  You can do it on-line or call.  They will call you a day or two prior to surgery and give you an arrival time.

## 2015-11-04 ENCOUNTER — Other Ambulatory Visit: Payer: Self-pay | Admitting: Podiatry

## 2015-11-04 DIAGNOSIS — M779 Enthesopathy, unspecified: Secondary | ICD-10-CM | POA: Diagnosis not present

## 2015-11-04 DIAGNOSIS — Z4889 Encounter for other specified surgical aftercare: Secondary | ICD-10-CM | POA: Diagnosis not present

## 2015-11-04 MED ORDER — OXYCODONE-ACETAMINOPHEN 10-325 MG PO TABS: 1.0000 | ORAL_TABLET | ORAL | Status: DC | PRN

## 2015-11-04 MED ORDER — CEPHALEXIN 500 MG PO CAPS: 500.0000 mg | ORAL_CAPSULE | Freq: Three times a day (TID) | ORAL | Status: DC

## 2015-11-04 MED ORDER — PROMETHAZINE HCL 25 MG PO TABS: 25.0000 mg | ORAL_TABLET | Freq: Three times a day (TID) | ORAL | Status: DC | PRN

## 2015-11-10 ENCOUNTER — Ambulatory Visit (INDEPENDENT_AMBULATORY_CARE_PROVIDER_SITE_OTHER): Payer: Medicare Other | Admitting: Podiatry

## 2015-11-10 ENCOUNTER — Encounter: Payer: Self-pay | Admitting: Podiatry

## 2015-11-10 ENCOUNTER — Ambulatory Visit (INDEPENDENT_AMBULATORY_CARE_PROVIDER_SITE_OTHER): Payer: Medicare Other

## 2015-11-10 VITALS — BP 150/85 | HR 74 | Resp 16

## 2015-11-10 DIAGNOSIS — M2042 Other hammer toe(s) (acquired), left foot: Secondary | ICD-10-CM

## 2015-11-10 DIAGNOSIS — Z472 Encounter for removal of internal fixation device: Secondary | ICD-10-CM

## 2015-11-10 DIAGNOSIS — Z9889 Other specified postprocedural states: Secondary | ICD-10-CM | POA: Diagnosis not present

## 2015-11-10 NOTE — Progress Notes (Signed)
She presents today 1 week postop removal of painful internal fixation second digit left foot second metatarsal left foot and release second metatarsophalangeal joint left foot. She denies fever chills nausea vomiting muscle aches and pains.  Objective: Vital signs stable alert and oriented 3. Pulses are strongly palpable. Dry sterile dressing was removed demonstrates no erythema edema similar drainage or odor. Sutures are intact and margins are well coapted. 3 views right foot demonstrates osseously normal foot with removal of internal fixation second metatarsal and second toe.  Assessment: Well-healing surgical foot.  Plan: [place] today dressed with compressive dressing follow-up with me in 1 week for suture removal.

## 2015-11-17 ENCOUNTER — Encounter: Payer: Self-pay | Admitting: Podiatry

## 2015-11-17 ENCOUNTER — Ambulatory Visit (INDEPENDENT_AMBULATORY_CARE_PROVIDER_SITE_OTHER): Payer: Medicare Other | Admitting: Podiatry

## 2015-11-17 VITALS — BP 150/85 | HR 74 | Resp 16

## 2015-11-17 DIAGNOSIS — M2042 Other hammer toe(s) (acquired), left foot: Secondary | ICD-10-CM

## 2015-11-17 DIAGNOSIS — Z472 Encounter for removal of internal fixation device: Secondary | ICD-10-CM

## 2015-11-17 DIAGNOSIS — Z9889 Other specified postprocedural states: Secondary | ICD-10-CM

## 2015-11-17 NOTE — Progress Notes (Signed)
She presents today for follow-up of release of the second metatarsophalangeal joint and removal of screw second digit left foot. She states that is doing very good and she's happy with the results.  Objective: Vital signs are stable she is alert and oriented 3 sutures are intact. Margins are well coapted. Sutures were removed today and margins remain well coapted there are no signs and infection.  Assessment: Well-healing surgical foot left.  Plan: I placed her in a compression anklet and showed her how to tie her second toe Michele Fernandez in a plantarflexed repositioned. I will follow up with her in 1 month if necessary.

## 2015-12-02 ENCOUNTER — Encounter: Payer: Self-pay | Admitting: Podiatry

## 2015-12-02 NOTE — Progress Notes (Signed)
DOS 11-04-15  Removal screw 2nd toe and 2nd met left, MPJ release 2nd toe left

## 2015-12-08 ENCOUNTER — Ambulatory Visit (INDEPENDENT_AMBULATORY_CARE_PROVIDER_SITE_OTHER): Payer: Medicare HMO | Admitting: Podiatry

## 2015-12-08 ENCOUNTER — Encounter: Payer: Self-pay | Admitting: Podiatry

## 2015-12-08 VITALS — BP 150/76 | HR 71 | Resp 16

## 2015-12-08 DIAGNOSIS — M2042 Other hammer toe(s) (acquired), left foot: Secondary | ICD-10-CM

## 2015-12-08 DIAGNOSIS — Z472 Encounter for removal of internal fixation device: Secondary | ICD-10-CM

## 2015-12-08 DIAGNOSIS — Z9889 Other specified postprocedural states: Secondary | ICD-10-CM

## 2015-12-08 NOTE — Progress Notes (Signed)
She presents today status post removal screw second digit left foot and release second metatarsophalangeal joint 11/12/2015. She states this seems to be doing pretty well.  Objective: Vital signs are stable she is alert and oriented 3. Pulses are strongly palpable. Foot appears to be healing quite nicely. Skin is healed minimal edema no erythema cellulitis drainage or odor.  Assessment: Well-healing surgical toe second left.  Plan: Follow up with me as needed.

## 2016-02-01 ENCOUNTER — Encounter: Payer: Self-pay | Admitting: Internal Medicine

## 2016-02-01 ENCOUNTER — Ambulatory Visit (INDEPENDENT_AMBULATORY_CARE_PROVIDER_SITE_OTHER): Payer: 59 | Admitting: Internal Medicine

## 2016-02-01 VITALS — BP 138/88 | HR 75 | Temp 98.0°F | Resp 12 | Ht 61.0 in | Wt 156.8 lb

## 2016-02-01 DIAGNOSIS — M810 Age-related osteoporosis without current pathological fracture: Secondary | ICD-10-CM

## 2016-02-01 DIAGNOSIS — E559 Vitamin D deficiency, unspecified: Secondary | ICD-10-CM | POA: Diagnosis not present

## 2016-02-01 DIAGNOSIS — I10 Essential (primary) hypertension: Secondary | ICD-10-CM

## 2016-02-01 DIAGNOSIS — R102 Pelvic and perineal pain unspecified side: Secondary | ICD-10-CM

## 2016-02-01 DIAGNOSIS — E785 Hyperlipidemia, unspecified: Secondary | ICD-10-CM | POA: Diagnosis not present

## 2016-02-01 DIAGNOSIS — E663 Overweight: Secondary | ICD-10-CM

## 2016-02-01 LAB — LIPID PANEL
CHOLESTEROL: 233 mg/dL — AB (ref 0–200)
HDL: 59.4 mg/dL (ref >39.00)
LDL CALC: 155 mg/dL — AB (ref 0–99)
NonHDL: 173.83
Total CHOL/HDL Ratio: 4
Triglycerides: 92 mg/dL (ref 0.0–149.0)
VLDL: 18.4 mg/dL (ref 0.0–40.0)

## 2016-02-01 LAB — COMPREHENSIVE METABOLIC PANEL
ALT: 22 U/L (ref 0–35)
AST: 20 U/L (ref 0–37)
Albumin: 4.4 g/dL (ref 3.5–5.2)
Alkaline Phosphatase: 53 U/L (ref 39–117)
BUN: 15 mg/dL (ref 6–23)
CALCIUM: 9.5 mg/dL (ref 8.4–10.5)
CHLORIDE: 107 meq/L (ref 96–112)
CO2: 27 meq/L (ref 19–32)
CREATININE: 0.76 mg/dL (ref 0.40–1.20)
GFR: 80.6 mL/min (ref >60.00)
GLUCOSE: 90 mg/dL (ref 70–99)
Potassium: 4.2 mEq/L (ref 3.5–5.1)
SODIUM: 141 meq/L (ref 135–145)
Total Bilirubin: 0.5 mg/dL (ref 0.2–1.2)
Total Protein: 7 g/dL (ref 6.0–8.3)

## 2016-02-01 LAB — LDL CHOLESTEROL, DIRECT: LDL DIRECT: 139 mg/dL

## 2016-02-01 LAB — VITAMIN D 25 HYDROXY (VIT D DEFICIENCY, FRACTURES): VITD: 31.09 ng/mL (ref 30.00–100.00)

## 2016-02-01 MED ORDER — BUTALBITAL-APAP-CAFFEINE 50-325-40 MG PO TABS: 1.0000 | ORAL_TABLET | Freq: Two times a day (BID) | ORAL | Status: DC | PRN

## 2016-02-01 NOTE — Progress Notes (Signed)
Pre-visit discussion using our clinic review tool. No additional management support is needed unless otherwise documented below in the visit note.  

## 2016-02-01 NOTE — Patient Instructions (Addendum)
Your BP is currently well controlled  If the morning numbers start to elevate > 130/80,  We can add low dose Toprol at bedtime  I will refill once I see you lytes from today

## 2016-02-03 ENCOUNTER — Encounter: Payer: Self-pay | Admitting: Internal Medicine

## 2016-02-03 DIAGNOSIS — E785 Hyperlipidemia, unspecified: Secondary | ICD-10-CM | POA: Insufficient documentation

## 2016-02-03 MED ORDER — LOSARTAN POTASSIUM-HCTZ 50-12.5 MG PO TABS: 1.0000 | ORAL_TABLET | Freq: Every day | ORAL | Status: DC

## 2016-02-03 NOTE — Assessment & Plan Note (Addendum)
Borderline control on current regimen. Renal function stable, no changes today. Add toprol XL at bedtime if morning readings are still > 130/80   Lab Results  Component Value Date   CREATININE 0.76 02/01/2016   Lab Results  Component Value Date   NA 141 02/01/2016   K 4.2 02/01/2016   CL 107 02/01/2016   CO2 27 02/01/2016

## 2016-02-03 NOTE — Assessment & Plan Note (Signed)
No recurrence since last visit. CA 125 and pelvic ultrasound were normal.

## 2016-02-03 NOTE — Progress Notes (Signed)
Subjective:  Patient ID: Michele Fernandez, female    DOB: June 29, 1946  Age: 70 y.o. MRN: [id number]  CC: The primary encounter diagnosis was Hyperlipidemia. Diagnoses of Essential hypertension, Vitamin D deficiency, Pelvic pain in female, Overweight (BMI 25.0-29.9), and Osteoporosis were also pertinent to this visit.  HPI Michele Fernandez presents for follow up on hypertension and other chronic conditions.  Patient is taking her medications as prescribed and notes no adverse effects.  Home BP readings have been done about once per week and are  generally < 130/80 .  She is avoiding added salt in her diet and walking regularly about 3 times per week for exercise  .  She underwent corrective eye  surgery since her last visit and notes improved visio to 20/20 after removal of lLeft eye cataract and lens implantation in October  Requesting referral of previously used fioricet for migraines  Very rare occurrence.   Outpatient Prescriptions Prior to Visit  Medication Sig Dispense Refill  . DUREZOL 0.05 % EMUL     . ILEVRO 0.3 % ophthalmic suspension     . losartan-hydrochlorothiazide (HYZAAR) 50-12.5 MG tablet     . losartan (COZAAR) 50 MG tablet Take 1 tablet (50 mg total) by mouth daily. (Patient not taking: Reported on 02/01/2016) 90 tablet 0   No facility-administered medications prior to visit.    Review of Systems;  Patient denies headache, fevers, malaise, unintentional weight loss, skin rash, eye pain, sinus congestion and sinus pain, sore throat, dysphagia,  hemoptysis , cough, dyspnea, wheezing, chest pain, palpitations, orthopnea, edema, abdominal pain, nausea, melena, diarrhea, constipation, flank pain, dysuria, hematuria, urinary  Frequency, nocturia, numbness, tingling, seizures,  Focal weakness, Loss of consciousness,  Tremor, insomnia, depression, anxiety, and suicidal ideation.      Objective:  BP 138/88 mmHg  Pulse 75  Temp(Src) 98 F (36.7 C) (Oral)  Resp 12  Ht 5'  1" (1.549 m)  Wt 156 lb 12 oz (71.101 kg)  BMI 29.63 kg/m2  SpO2 96%  BP Readings from Last 3 Encounters:  02/01/16 138/88  12/08/15 150/76  11/17/15 150/85    Wt Readings from Last 3 Encounters:  02/01/16 156 lb 12 oz (71.101 kg)  09/13/15 153 lb (69.4 kg)  08/26/15 153 lb (69.4 kg)    General appearance: alert, cooperative and appears stated age Ears: normal TM's and external ear canals both ears Throat: lips, mucosa, and tongue normal; teeth and gums normal Neck: no adenopathy, no carotid bruit, supple, symmetrical, trachea midline and thyroid not enlarged, symmetric, no tenderness/mass/nodules Back: symmetric, no curvature. ROM normal. No CVA tenderness. Lungs: clear to auscultation bilaterally Heart: regular rate and rhythm, S1, S2 normal, no murmur, click, rub or gallop Abdomen: soft, non-tender; bowel sounds normal; no masses,  no organomegaly Pulses: 2+ and symmetric Skin: Skin color, texture, turgor normal. No rashes or lesions Lymph nodes: Cervical, supraclavicular, and axillary nodes normal.  No results found for: HGBA1C  Lab Results  Component Value Date   CREATININE 0.76 02/01/2016   CREATININE 0.78 07/22/2015   CREATININE 0.80 04/09/2015    Lab Results  Component Value Date   WBC 6.0 07/22/2015   HGB 14.1 07/22/2015   HCT 42.6 07/22/2015   PLT 267.0 07/22/2015   GLUCOSE 90 02/01/2016   CHOL 233* 02/01/2016   TRIG 92.0 02/01/2016   HDL 59.40 02/01/2016   LDLDIRECT 139.0 02/01/2016   LDLCALC 155* 02/01/2016   ALT 22 02/01/2016   AST 20 02/01/2016   NA 141  02/01/2016   K 4.2 02/01/2016   CL 107 02/01/2016   CREATININE 0.76 02/01/2016   BUN 15 02/01/2016   CO2 27 02/01/2016   TSH 1.58 07/22/2015    No results found.  Assessment & Plan:   Problem List Items Addressed This Visit    Hypertension    Borderline control on current regimen. Renal function stable, no changes today. Add toprol XL at bedtime if morning readings are still > 130/80    Lab Results  Component Value Date   CREATININE 0.76 02/01/2016   Lab Results  Component Value Date   NA 141 02/01/2016   K 4.2 02/01/2016   CL 107 02/01/2016   CO2 27 02/01/2016         Relevant Medications   losartan-hydrochlorothiazide (HYZAAR) 50-12.5 MG tablet   Other Relevant Orders   Comprehensive metabolic panel (Completed)   Osteoporosis    She is due for repeat DEXA after finishing b/p therapy for over 5 year       Relevant Medications   Cholecalciferol (VITAMIN D3) 2000 units TABS   Other Relevant Orders   DG Bone Density   Overweight (BMI 25.0-29.9)    I have addressed  BMI and recommended wt loss of 10% of body weigh over the next 6 months using a low glycemic index diet and regular exercise a minimum of 5 days per week.        Pelvic pain in female    No recurrence since last visit. CA 125 and pelvic ultrasound were normal.       Hyperlipidemia - Primary   Relevant Medications   losartan-hydrochlorothiazide (HYZAAR) 50-12.5 MG tablet   Other Relevant Orders   Lipid panel (Completed)   LDL cholesterol, direct (Completed)    Other Visit Diagnoses    Vitamin D deficiency        Relevant Orders    VITAMIN D 25 Hydroxy (Vit-D Deficiency, Fractures) (Completed)       I have discontinued Ms. Sabas's losartan, DUREZOL, and ILEVRO. I have also changed her losartan-hydrochlorothiazide. Additionally, I am having her start on butalbital-acetaminophen-caffeine. Lastly, I am having her maintain her Vitamin D3.  Meds ordered this encounter  Medications  . Cholecalciferol (VITAMIN D3) 2000 units TABS    Sig: Take 1 tablet by mouth daily.  . butalbital-acetaminophen-caffeine (FIORICET, ESGIC) 50-325-40 MG tablet    Sig: Take 1 tablet by mouth 2 (two) times daily as needed for headache or migraine.    Dispense:  30 tablet    Refill:  1  . losartan-hydrochlorothiazide (HYZAAR) 50-12.5 MG tablet    Sig: Take 1 tablet by mouth daily.    Dispense:  90 tablet     Refill:  1    Medications Discontinued During This Encounter  Medication Reason  . losartan (COZAAR) 50 MG tablet   . DUREZOL 0.05 % EMUL   . ILEVRO 0.3 % ophthalmic suspension   . losartan-hydrochlorothiazide (HYZAAR) 50-12.5 MG tablet Reorder    Follow-up: Return in about 6 months (around 07/31/2016), or annual exam .   Crecencio Mc, MD

## 2016-02-03 NOTE — Assessment & Plan Note (Signed)
I have addressed  BMI and recommended wt loss of 10% of body weigh over the next 6 months using a low glycemic index diet and regular exercise a minimum of 5 days per week.   

## 2016-02-03 NOTE — Assessment & Plan Note (Signed)
She is due for repeat DEXA after finishing b/p therapy for over 5 year  

## 2016-02-10 ENCOUNTER — Encounter: Payer: Self-pay | Admitting: Internal Medicine

## 2016-02-22 DIAGNOSIS — M810 Age-related osteoporosis without current pathological fracture: Secondary | ICD-10-CM | POA: Diagnosis not present

## 2016-02-22 LAB — HM DEXA SCAN

## 2016-02-24 ENCOUNTER — Telehealth: Payer: Self-pay

## 2016-02-24 NOTE — Telephone Encounter (Signed)
PA for Butalbital/Acetaminophen/caffeine,   Approved from 12/04/2015-12/03/2016

## 2016-02-28 ENCOUNTER — Telehealth: Payer: Self-pay | Admitting: Internal Medicine

## 2016-02-28 NOTE — Telephone Encounter (Signed)
My Chart message sent

## 2016-02-29 ENCOUNTER — Other Ambulatory Visit: Payer: Self-pay | Admitting: Internal Medicine

## 2016-03-20 ENCOUNTER — Telehealth: Payer: Self-pay | Admitting: Internal Medicine

## 2016-03-20 NOTE — Telephone Encounter (Signed)
Cancel. Pt is making appt.

## 2016-03-22 ENCOUNTER — Encounter: Payer: Self-pay | Admitting: Family Medicine

## 2016-03-22 ENCOUNTER — Ambulatory Visit: Payer: 59 | Admitting: Family Medicine

## 2016-03-22 ENCOUNTER — Ambulatory Visit (INDEPENDENT_AMBULATORY_CARE_PROVIDER_SITE_OTHER): Payer: Medicare HMO | Admitting: Family Medicine

## 2016-03-22 VITALS — BP 132/86 | HR 78 | Temp 98.3°F | Ht 61.0 in | Wt 158.8 lb

## 2016-03-22 DIAGNOSIS — R0789 Other chest pain: Secondary | ICD-10-CM

## 2016-03-22 DIAGNOSIS — K219 Gastro-esophageal reflux disease without esophagitis: Secondary | ICD-10-CM

## 2016-03-22 NOTE — Progress Notes (Signed)
Patient ID: Michele Fernandez, female   DOB: 11-24-1946, 69 y.o.   MRN: [id number]  Tommi Rumps, MD Phone: [phone]  Michele Fernandez is a 70 y.o. female who presents today for same-day visit.  Patient notes she has a long history of reflux. Typically in the past when she's gotten reflux she gets a lump in her throat and a tightness and feels as though she can't swallow the lump. She's previously had an EGD in 2005 that she reports was normal. She notes on 03/14/16 she developed a burning sensation in her throat and chest and a tightness in her neck with this. She took a Pepcid and this resolved her symptoms. She notes for the next 2 days she took Pepcid and this was well controlled though the following day she had recurrence of her symptoms. She notes belching throughout this. No radiation down her arms. No diaphoresis or shortness of breath. No exertional component. No family history of MI. Does have a family history of stroke. She does have hyperlipidemia and hypertension. No blood in her stool. Typically avoiding spicy foods and acidic foods helps limit her reflux. He notes her symptoms are much better today. Minimal burning sensation. She does have a history of DVT many years ago that was treated with Coumadin and has not had recurrence. No swelling in her legs.  PMH: Former smoker   ROS see history of present illness  Objective  Physical Exam Filed Vitals:   03/22/16 1106  BP: 132/86  Pulse: 78  Temp: 98.3 F (36.8 C)    BP Readings from Last 3 Encounters:  03/22/16 132/86  02/01/16 138/88  12/08/15 150/76   Wt Readings from Last 3 Encounters:  03/22/16 158 lb 12.8 oz (72.031 kg)  02/01/16 156 lb 12 oz (71.101 kg)  09/13/15 153 lb (69.4 kg)    Physical Exam  Constitutional: She is well-developed, well-nourished, and in no distress.  HENT:  Head: Normocephalic and atraumatic.  Cardiovascular: Normal rate, regular rhythm and normal heart sounds.     Pulmonary/Chest: Effort normal and breath sounds normal.  Abdominal: Soft. Bowel sounds are normal. She exhibits no distension. There is no tenderness. There is no rebound and no guarding.  Musculoskeletal: She exhibits no edema.  Neurological: She is alert. Gait normal.  Skin: Skin is warm and dry. She is not diaphoretic.   EKG: Normal sinus rhythm, rate 74, no ST or T-wave changes  Assessment/Plan: Please see individual problem list.  GERD (gastroesophageal reflux disease) Patient's symptoms most consistent with GERD. Atypical in nature and an EKG is reassuring making cardiac cause unlikely. Vital signs are stable with no tachycardia or oxygen desaturation making PE unlikely cause. Description is not consistent with musculoskeletal cause. Blood pressure is stable and she is not tachycardic making vascular cause unlikely. We will proceed with treatment of GERD with over-the-counter Nexium. She will watch what she eats to limit food associated cause. She will continue to monitor. She is given return precautions.    Orders Placed This Encounter  Procedures  . EKG 12-Lead    Tommi Rumps, MD [place]

## 2016-03-22 NOTE — Progress Notes (Signed)
Pre visit review using our clinic review tool, if applicable. No additional management support is needed unless otherwise documented below in the visit note. 

## 2016-03-22 NOTE — Assessment & Plan Note (Addendum)
Patient's symptoms most consistent with GERD. Atypical in nature and an EKG is reassuring making cardiac cause unlikely. Vital signs are stable with no tachycardia or oxygen desaturation making PE unlikely cause. Description is not consistent with musculoskeletal cause. Blood pressure is stable and she is not tachycardic making vascular cause unlikely. We will proceed with treatment of GERD with over-the-counter Nexium. She will watch what she eats to limit food associated cause. She will continue to monitor. She is given return precautions.

## 2016-03-22 NOTE — Patient Instructions (Signed)
Nice to meet you. Your symptoms are likely related to reflux. Please try over-the-counter Nexium for this issue. If you develop abdominal pain, chest pressure, shortness of breath, sweating, or any new or changing symptoms please seek medical attention.

## 2016-03-27 ENCOUNTER — Ambulatory Visit: Payer: Self-pay | Admitting: Internal Medicine

## 2016-03-31 ENCOUNTER — Encounter: Payer: Self-pay | Admitting: Internal Medicine

## 2016-04-09 ENCOUNTER — Telehealth: Payer: Self-pay | Admitting: Internal Medicine

## 2016-04-09 NOTE — Telephone Encounter (Signed)
DEXA scan from [facility] received,  No significant changes to osteopenia

## 2016-04-12 ENCOUNTER — Encounter: Payer: Self-pay | Admitting: *Deleted

## 2016-04-12 NOTE — Telephone Encounter (Signed)
Letter mailed

## 2016-04-17 ENCOUNTER — Ambulatory Visit (INDEPENDENT_AMBULATORY_CARE_PROVIDER_SITE_OTHER): Payer: Medicare HMO | Admitting: Podiatry

## 2016-04-17 ENCOUNTER — Encounter: Payer: Self-pay | Admitting: Podiatry

## 2016-04-17 ENCOUNTER — Ambulatory Visit (INDEPENDENT_AMBULATORY_CARE_PROVIDER_SITE_OTHER): Payer: Medicare HMO

## 2016-04-17 VITALS — BP 139/79 | HR 86 | Resp 12

## 2016-04-17 DIAGNOSIS — M79672 Pain in left foot: Secondary | ICD-10-CM | POA: Diagnosis not present

## 2016-04-17 DIAGNOSIS — S93602A Unspecified sprain of left foot, initial encounter: Secondary | ICD-10-CM

## 2016-04-18 NOTE — Progress Notes (Signed)
She presents today with a few weeks' duration of pain to the dorsal lateral aspect of the foot between the fourth and fifth metatarsals. She denies any trauma. States it has been swollen. His painful to walk on.  Objective: Vital signs are stable alert and oriented 3 there is no erythema and mild edema no cellulitis drainage or odor. No open lesions no puncture marks no signs of infection. Radiographs do not demonstrate any type of foreign body nor do they demonstrate any type of osseus abnormality. She has tenderness on frontal plane range of motion at Lisfranc's joints as well as pain on medial lateral compression of the metatarsals and on direct palpation of the fourth intermetatarsal space centrally and proximally. I reviewed the radiographs once again no osseous findings are noted.  Assessment: Sprain left foot.  Plan: A compression dressing suggest that she get back into her cam walker which she has at home. I recommended short doses of anti-inflammatories due to her hypertension. I will follow-up with her again in 3-4 weeks if she is not completely better we will consider an MRI of the midfoot forefoot at that point.

## 2016-04-28 DIAGNOSIS — H43812 Vitreous degeneration, left eye: Secondary | ICD-10-CM | POA: Diagnosis not present

## 2016-05-18 DIAGNOSIS — D2261 Melanocytic nevi of right upper limb, including shoulder: Secondary | ICD-10-CM | POA: Diagnosis not present

## 2016-05-18 DIAGNOSIS — D225 Melanocytic nevi of trunk: Secondary | ICD-10-CM | POA: Diagnosis not present

## 2016-05-18 DIAGNOSIS — D485 Neoplasm of uncertain behavior of skin: Secondary | ICD-10-CM | POA: Diagnosis not present

## 2016-05-18 DIAGNOSIS — L821 Other seborrheic keratosis: Secondary | ICD-10-CM | POA: Diagnosis not present

## 2016-05-18 DIAGNOSIS — D2271 Melanocytic nevi of right lower limb, including hip: Secondary | ICD-10-CM | POA: Diagnosis not present

## 2016-06-08 DIAGNOSIS — H25043 Posterior subcapsular polar age-related cataract, bilateral: Secondary | ICD-10-CM | POA: Diagnosis not present

## 2016-06-27 ENCOUNTER — Encounter: Payer: Self-pay | Admitting: *Deleted

## 2016-06-29 DIAGNOSIS — L820 Inflamed seborrheic keratosis: Secondary | ICD-10-CM | POA: Diagnosis not present

## 2016-06-29 DIAGNOSIS — D485 Neoplasm of uncertain behavior of skin: Secondary | ICD-10-CM | POA: Diagnosis not present

## 2016-06-29 DIAGNOSIS — L905 Scar conditions and fibrosis of skin: Secondary | ICD-10-CM | POA: Diagnosis not present

## 2016-06-29 DIAGNOSIS — D2261 Melanocytic nevi of right upper limb, including shoulder: Secondary | ICD-10-CM | POA: Diagnosis not present

## 2016-07-31 ENCOUNTER — Encounter: Payer: Self-pay | Admitting: Internal Medicine

## 2016-07-31 ENCOUNTER — Ambulatory Visit (INDEPENDENT_AMBULATORY_CARE_PROVIDER_SITE_OTHER): Payer: Medicare HMO | Admitting: Internal Medicine

## 2016-07-31 ENCOUNTER — Other Ambulatory Visit (HOSPITAL_COMMUNITY)
Admission: RE | Admit: 2016-07-31 | Discharge: 2016-07-31 | Disposition: A | Discharge status: Home / Self Care | Payer: Medicare HMO | Source: Ambulatory Visit | Attending: Internal Medicine | Admitting: Internal Medicine

## 2016-07-31 VITALS — BP 140/80 | HR 71 | Temp 98.1°F | Resp 12 | Ht 61.5 in | Wt 154.5 lb

## 2016-07-31 DIAGNOSIS — Z113 Encounter for screening for infections with a predominantly sexual mode of transmission: Secondary | ICD-10-CM | POA: Diagnosis present

## 2016-07-31 DIAGNOSIS — R5383 Other fatigue: Secondary | ICD-10-CM

## 2016-07-31 DIAGNOSIS — N760 Acute vaginitis: Secondary | ICD-10-CM

## 2016-07-31 DIAGNOSIS — E785 Hyperlipidemia, unspecified: Secondary | ICD-10-CM | POA: Diagnosis not present

## 2016-07-31 DIAGNOSIS — E663 Overweight: Secondary | ICD-10-CM

## 2016-07-31 DIAGNOSIS — Z0001 Encounter for general adult medical examination with abnormal findings: Secondary | ICD-10-CM | POA: Insufficient documentation

## 2016-07-31 DIAGNOSIS — I10 Essential (primary) hypertension: Secondary | ICD-10-CM

## 2016-07-31 DIAGNOSIS — Z01419 Encounter for gynecological examination (general) (routine) without abnormal findings: Secondary | ICD-10-CM | POA: Diagnosis present

## 2016-07-31 DIAGNOSIS — Z0000 Encounter for general adult medical examination without abnormal findings: Secondary | ICD-10-CM

## 2016-07-31 DIAGNOSIS — K2100 Gastro-esophageal reflux disease with esophagitis, without bleeding: Secondary | ICD-10-CM

## 2016-07-31 DIAGNOSIS — Z1151 Encounter for screening for human papillomavirus (HPV): Secondary | ICD-10-CM | POA: Diagnosis present

## 2016-07-31 DIAGNOSIS — Z23 Encounter for immunization: Secondary | ICD-10-CM

## 2016-07-31 LAB — LIPID PANEL
CHOLESTEROL: 221 mg/dL — AB (ref 0–200)
HDL: 56.3 mg/dL (ref >39.00)
LDL Cholesterol: 149 mg/dL — ABNORMAL HIGH (ref 0–99)
NonHDL: 164.8
Total CHOL/HDL Ratio: 4
Triglycerides: 81 mg/dL (ref 0.0–149.0)
VLDL: 16.2 mg/dL (ref 0.0–40.0)

## 2016-07-31 LAB — COMPREHENSIVE METABOLIC PANEL
ALBUMIN: 4.2 g/dL (ref 3.5–5.2)
ALK PHOS: 45 U/L (ref 39–117)
ALT: 17 U/L (ref 0–35)
AST: 17 U/L (ref 0–37)
BUN: 13 mg/dL (ref 6–23)
CHLORIDE: 107 meq/L (ref 96–112)
CO2: 28 mEq/L (ref 19–32)
CREATININE: 0.79 mg/dL (ref 0.40–1.20)
Calcium: 8.8 mg/dL (ref 8.4–10.5)
GFR: 76.96 mL/min (ref >60.00)
Glucose, Bld: 90 mg/dL (ref 70–99)
POTASSIUM: 4.2 meq/L (ref 3.5–5.1)
SODIUM: 141 meq/L (ref 135–145)
Total Bilirubin: 0.5 mg/dL (ref 0.2–1.2)
Total Protein: 6.6 g/dL (ref 6.0–8.3)

## 2016-07-31 LAB — TSH: TSH: 1.19 u[IU]/mL (ref 0.35–4.50)

## 2016-07-31 NOTE — Progress Notes (Signed)
Pre-visit discussion using our clinic review tool. No additional management support is needed unless otherwise documented below in the visit note.  

## 2016-07-31 NOTE — Assessment & Plan Note (Signed)
I have addressed  BMI and recommended a low glycemic index diet utilizing smaller more frequent meals to increase metabolism.  I have also recommended that patient start exercising with a goal of 30 minutes of aerobic exercise a minimum of 5 days per week. Screening for lipid disorders, thyroid and diabetes to be done today.   

## 2016-07-31 NOTE — Patient Instructions (Addendum)
There is a really midld fish called Bar[place].  Available as Fish fillets  at [employer])   [employer] a wonderful cookbook for Mediterranean style cooking available at [employer]   Look for the protein  fortified almond milk at the end of cereal aisle in [facility] Triple Zero  Has a similar  Nutritional analysis  As  [employer]  Light n Fit greek yogurt with natural sweeteners   Menopause is a normal process in which your reproductive ability comes to an end. This process happens gradually over a span of months to years, usually between the ages of 61 and 48. Menopause is complete when you have missed 12 consecutive menstrual periods. It is important to talk with your health care provider about some of the most common conditions that affect postmenopausal women, such as heart disease, cancer, and bone loss (osteoporosis). Adopting a healthy lifestyle and getting preventive care can help to promote your health and wellness. Those actions can also lower your chances of developing some of these common conditions. WHAT SHOULD I KNOW ABOUT MENOPAUSE? During menopause, you may experience a number of symptoms, such as:  Moderate-to-severe hot flashes.  Night sweats.  Decrease in sex drive.  Mood swings.  Headaches.  Tiredness.  Irritability.  Memory problems.  Insomnia. Choosing to treat or not to treat menopausal changes is an individual decision that you make with your health care provider. WHAT SHOULD I KNOW ABOUT HORMONE REPLACEMENT THERAPY AND SUPPLEMENTS? Hormone therapy products are effective for treating symptoms that are associated with menopause, such as hot flashes and night sweats. Hormone replacement carries certain risks, especially as you become older. If you are thinking about using estrogen or estrogen with progestin treatments, discuss the benefits and risks with your health care provider. WHAT SHOULD I KNOW ABOUT HEART DISEASE AND STROKE? Heart disease,  heart attack, and stroke become more likely as you age. This may be due, in part, to the hormonal changes that your body experiences during menopause. These can affect how your body processes dietary fats, triglycerides, and cholesterol. Heart attack and stroke are both medical emergencies. There are many things that you can do to help prevent heart disease and stroke:  Have your blood pressure checked at least every 1-2 years. High blood pressure causes heart disease and increases the risk of stroke.  If you are 13-36 years old, ask your health care provider if you should take aspirin to prevent a heart attack or a stroke.  Do not use any tobacco products, including cigarettes, chewing tobacco, or electronic cigarettes. If you need help quitting, ask your health care provider.  It is important to eat a healthy diet and maintain a healthy weight.  Be sure to include plenty of vegetables, fruits, low-fat dairy products, and lean protein.  Avoid eating foods that are high in solid fats, added sugars, or salt (sodium).  Get regular exercise. This is one of the most important things that you can do for your health.  Try to exercise for at least 150 minutes each week. The type of exercise that you do should increase your heart rate and make you sweat. This is known as moderate-intensity exercise.  Try to do strengthening exercises at least twice each week. Do these in addition to the moderate-intensity exercise.  Know your numbers.Ask your health care provider to check your cholesterol and your blood glucose. Continue to have your blood tested as directed by your health care provider. WHAT  SHOULD I KNOW ABOUT CANCER SCREENING? There are several types of cancer. Take the following steps to reduce your risk and to catch any cancer development as early as possible. Breast Cancer  Practice breast self-awareness.  This means understanding how your breasts normally appear and feel.  It also means  doing regular breast self-exams. Let your health care provider know about any changes, no matter how small.  If you are 48 or older, have a clinician do a breast exam (clinical breast exam or CBE) every year. Depending on your age, family history, and medical history, it may be recommended that you also have a yearly breast X-ray (mammogram).  If you have a family history of breast cancer, talk with your health care provider about genetic screening.  If you are at high risk for breast cancer, talk with your health care provider about having an MRI and a mammogram every year.  Breast cancer (BRCA) gene test is recommended for women who have family members with BRCA-related cancers. Results of the assessment will determine the need for genetic counseling and BRCA1 and for BRCA2 testing. BRCA-related cancers include these types:  Breast. This occurs in males or females.  Ovarian.  Tubal. This may also be called fallopian tube cancer.  Cancer of the abdominal or pelvic lining (peritoneal cancer).  Prostate.  Pancreatic. Cervical, Uterine, and Ovarian Cancer Your health care provider may recommend that you be screened regularly for cancer of the pelvic organs. These include your ovaries, uterus, and vagina. This screening involves a pelvic exam, which includes checking for microscopic changes to the surface of your cervix (Pap test).  For women ages 21-65, health care providers may recommend a pelvic exam and a Pap test every three years. For women ages 68-65, they may recommend the Pap test and pelvic exam, combined with testing for human papilloma virus (HPV), every five years. Some types of HPV increase your risk of cervical cancer. Testing for HPV may also be done on women of any age who have unclear Pap test results.  Other health care providers may not recommend any screening for nonpregnant women who are considered low risk for pelvic cancer and have no symptoms. Ask your health care  provider if a screening pelvic exam is right for you.  If you have had past treatment for cervical cancer or a condition that could lead to cancer, you need Pap tests and screening for cancer for at least 20 years after your treatment. If Pap tests have been discontinued for you, your risk factors (such as having a new sexual partner) need to be reassessed to determine if you should start having screenings again. Some women have medical problems that increase the chance of getting cervical cancer. In these cases, your health care provider may recommend that you have screening and Pap tests more often.  If you have a family history of uterine cancer or ovarian cancer, talk with your health care provider about genetic screening.  If you have vaginal bleeding after reaching menopause, tell your health care provider.  There are currently no reliable tests available to screen for ovarian cancer. Lung Cancer Lung cancer screening is recommended for adults 65-38 years old who are at high risk for lung cancer because of a history of smoking. A yearly low-dose CT scan of the lungs is recommended if you:  Currently smoke.  Have a history of at least 30 pack-years of smoking and you currently smoke or have quit within the past 15 years. A  pack-year is smoking an average of one pack of cigarettes per day for one year. Yearly screening should:  Continue until it has been 15 years since you quit.  Stop if you develop a health problem that would prevent you from having lung cancer treatment. Colorectal Cancer  This type of cancer can be detected and can often be prevented.  Routine colorectal cancer screening usually begins at age 24 and continues through age 58.  If you have risk factors for colon cancer, your health care provider may recommend that you be screened at an earlier age.  If you have a family history of colorectal cancer, talk with your health care provider about genetic screening.  Your  health care provider may also recommend using home test kits to check for hidden blood in your stool.  A small camera at the end of a tube can be used to examine your colon directly (sigmoidoscopy or colonoscopy). This is done to check for the earliest forms of colorectal cancer.  Direct examination of the colon should be repeated every 5-10 years until age 56. However, if early forms of precancerous polyps or small growths are found or if you have a family history or genetic risk for colorectal cancer, you may need to be screened more often. Skin Cancer  Check your skin from head to toe regularly.  Monitor any moles. Be sure to tell your health care provider:  About any new moles or changes in moles, especially if there is a change in a mole's shape or color.  If you have a mole that is larger than the size of a pencil eraser.  If any of your family members has a history of skin cancer, especially at a young age, talk with your health care provider about genetic screening.  Always use sunscreen. Apply sunscreen liberally and repeatedly throughout the day.  Whenever you are outside, protect yourself by wearing long sleeves, pants, a wide-brimmed hat, and sunglasses. WHAT SHOULD I KNOW ABOUT OSTEOPOROSIS? Osteoporosis is a condition in which bone destruction happens more quickly than new bone creation. After menopause, you may be at an increased risk for osteoporosis. To help prevent osteoporosis or the bone fractures that can happen because of osteoporosis, the following is recommended:  If you are 31-41 years old, get at least 1,000 mg of calcium and at least 600 mg of vitamin D per day.  If you are older than age 60 but younger than age 64, get at least 1,200 mg of calcium and at least 600 mg of vitamin D per day.  If you are older than age 6, get at least 1,200 mg of calcium and at least 800 mg of vitamin D per day. Smoking and excessive alcohol intake increase the risk of  osteoporosis. Eat foods that are rich in calcium and vitamin D, and do weight-bearing exercises several times each week as directed by your health care provider. WHAT SHOULD I KNOW ABOUT HOW MENOPAUSE AFFECTS [place]? Depression may occur at any age, but it is more common as you become older. Common symptoms of depression include:  Low or sad mood.  Changes in sleep patterns.  Changes in appetite or eating patterns.  Feeling an overall lack of motivation or enjoyment of activities that you previously enjoyed.  Frequent crying spells. Talk with your health care provider if you think that you are experiencing depression. WHAT SHOULD I KNOW ABOUT IMMUNIZATIONS? It is important that you get and maintain your immunizations. These include:  Tetanus, diphtheria, and pertussis (Tdap) booster vaccine.  Influenza every year before the flu season begins.  Pneumonia vaccine.  Shingles vaccine. Your health care provider may also recommend other immunizations.   This information is not intended to replace advice given to you by your health care provider. Make sure you discuss any questions you have with your health care provider.   Document Released: 01/12/2006 Document Revised: 12/11/2014 Document Reviewed: 07/23/2014 Elsevier Interactive Patient Education [employer].

## 2016-07-31 NOTE — Progress Notes (Signed)
Patient ID: Michele Fernandez, female    DOB: September 16, 1946  Age: 70 y.o. MRN: [id number]  The patient is here for her annual CPE and management of other chronic and acute problems.   Healh Maintenance:  right breast biopsy benign sept 2016. [facility] ordering mammograms Colonoscopy 2014, tiny benign sessile polyp and hemorrhoids, FU 2024 per   [facility] DEXA scan osteopenia 2017  Not taking calcium pill..  Taking D 2000 every other day   The risk factors are reflected in the social history.  The roster of all physicians providing medical care to patient - is listed in the Snapshot section of the chart.   Home safety : The patient has smoke detectors in the home. They wear seatbelts.  There are no firearms at home. There is no violence in the home.   There is no risks for hepatitis, STDs or HIV. There is no   history of blood transfusion. They have no travel history to infectious disease endemic areas of the world.  The patient has seen their dentist in the last six month.   They have seen their eye doctor in July. Had cataract surgery recently  .  Marland Kitchen  They do not  have excessive sun exposure. Discussed the need for sun protection: hats, long sleeves and use of sunscreen if there is significant sun exposure.   Diet: the importance of a healthy diet is discussed. She is following a  Low glycemic index  diet.  The benefits of regular aerobic exercise were discussed. She exercises for 40 minutes 3 to 5 times per week . Some left knee crepitus  Avoids lunges and squats. .   Depression screen: there are no signs or vegative symptoms of depression- irritability, change in appetite, anhedonia, sadness/tearfullness.   The following portions of the patient's history were reviewed and updated as appropriate: allergies, current medications, past family history, past medical history,  past surgical history, past social history  and problem list.  Visual acuity was not assessed per patient  preference since she has regular follow up with her ophthalmologist. Hearing and body mass index were assessed and reviewed.   During the course of the visit the patient was educated and counseled about appropriate screening and preventive services including : fall prevention , diabetes screening, nutrition counseling, colorectal cancer screening, and recommended immunizations.    CC: The primary encounter diagnosis was Hyperlipidemia. Diagnoses of Vaginitis and vulvovaginitis, Other fatigue, Encounter for immunization, Gastroesophageal reflux disease with esophagitis, Essential hypertension, Visit for preventive health examination, and Overweight (BMI 25.0-29.9) were also pertinent to this visit.  GERD with  Esophagtis:  She was seen for evaluation of chest pain in April by ES,   Nexium OTC daily advised, which she took daily for 2 weeks,  symptoms now resolved. Medication stopped  Atrophic vaginitis:  Using estrogen suppositories with progesterone cream once every 3 months prn recurrence. No vaginal bleeding.  Husband recently treated for CA with radical prostatectomy , now incontinent of urine..  Not currently sexually active  Since early summer.  Started having vaginal discharge with  odor a few days ago  History Michele Fernandez has a past medical history of Cancer ([place]); GERD (gastroesophageal reflux disease); Hypertension; and Other sign and symptom in breast.   She has a past surgical history that includes Breast biopsy (Bilateral, 1993); Breast biopsy (Left, 1989); Upper gi endoscopy (2006); Hammer toe surgery (2006); Foot surgery (2009, 2012); Cerebral aneurysm repair (1991); Colonoscopy (2014); Eye surgery (Right, 11/2014); and Cataract extraction  w/PHACO (Left, 09/13/2015).   Her family history includes Alcohol abuse in her son; Cancer in her brother and maternal aunt; Heart attack in her maternal grandmother.She reports that she quit smoking about 43 years ago. Her smoking use included  Cigarettes. She has a 15.00 pack-year smoking history. She has never used smokeless tobacco. She reports that she does not drink alcohol or use drugs.  Outpatient Medications Prior to Visit  Medication Sig Dispense Refill  . butalbital-acetaminophen-caffeine (FIORICET, ESGIC) 50-325-40 MG tablet Take 1 tablet by mouth 2 (two) times daily as needed for headache or migraine. 30 tablet 1  . Cholecalciferol (VITAMIN D3) 2000 units TABS Take 1 tablet by mouth daily.    Marland Kitchen losartan-hydrochlorothiazide (HYZAAR) 50-12.5 MG tablet TAKE 1 TABLET EVERY DAY 30 tablet 5   No facility-administered medications prior to visit.     Review of Systems   Patient denies headache, fevers, malaise, unintentional weight loss, skin rash, eye pain, sinus congestion and sinus pain, sore throat, dysphagia,  hemoptysis , cough, dyspnea, wheezing, chest pain, palpitations, orthopnea, edema, abdominal pain, nausea, melena, diarrhea, constipation, flank pain, dysuria, hematuria, urinary  Frequency, nocturia, numbness, tingling, seizures,  Focal weakness, Loss of consciousness,  Tremor, insomnia, depression, anxiety, and suicidal ideation.     Objective:  BP 140/80   Pulse 71   Temp 98.1 F (36.7 C) (Oral)   Resp 12   Ht 5' 1.5" (1.562 m)   Wt 154 lb 8 oz (70.1 kg)   SpO2 98%   BMI 28.72 kg/m   Physical Exam   General Appearance:    Alert, cooperative, no distress, appears stated age  Head:    Normocephalic, without obvious abnormality, atraumatic  Eyes:    PERRL, conjunctiva/corneas clear, EOM's intact, fundi    benign, both eyes  Ears:    Normal TM's and external ear canals, both ears  Nose:   Nares normal, septum midline, mucosa normal, no drainage    or sinus tenderness  Throat:   Lips, mucosa, and tongue normal; teeth and gums normal  Neck:   Supple, symmetrical, trachea midline, no adenopathy;    thyroid:  no enlargement/tenderness/nodules; no carotid   bruit or JVD  Back:     Symmetric, no curvature,  ROM normal, no CVA tenderness  Lungs:     Clear to auscultation bilaterally, respirations unlabored  Chest Wall:    No tenderness or deformity   Heart:    Regular rate and rhythm, S1 and S2 normal, no murmur, rub   or gallop  Breast Exam:    Bilateral surgical scars,  No tenderness, masses, or nipple abnormality  Abdomen:     Soft, non-tender, bowel sounds active all four quadrants,    no masses, no organomegaly  Genitalia:    Pelvic: cervix erythematous with scant amount of thick yellow discharge, external genitalia normal, no adnexal masses or tenderness, no cervical motion tenderness, rectovaginal septum normal, uterus normal size, shape, and consistency and vagina normal  Extremities:   Extremities normal, atraumatic, no cyanosis or edema  Pulses:   2+ and symmetric all extremities  Skin:   Skin color, texture, turgor normal, no rashes or lesions  Lymph nodes:   Cervical, supraclavicular, and axillary nodes normal  Neurologic:   CNII-XII intact, normal strength, sensation and reflexes    throughout      Assessment & Plan:   Problem List Items Addressed This Visit    Hypertension    Well controlled on current regimen. Renal function stable, no  changes today . Repeat labs due  Lab Results  Component Value Date   CREATININE 0.76 02/01/2016   Lab Results  Component Value Date   NA 141 02/01/2016   K 4.2 02/01/2016   CL 107 02/01/2016   CO2 27 02/01/2016         Relevant Medications   aspirin EC 81 MG tablet   Overweight (BMI 25.0-29.9)    I have addressed  BMI and recommended a low glycemic index diet utilizing smaller more frequent meals to increase metabolism.  I have also recommended that patient start exercising with a goal of 30 minutes of aerobic exercise a minimum of 5 days per week. Screening for lipid disorders, thyroid and diabetes to be done today.        GERD (gastroesophageal reflux disease)    Symptoms aggravated by diet, now resolved.  No longer on PPI       Relevant Medications   Probiotic Product (ALIGN) 4 MG CAPS   Visit for preventive health examination    Annual comprehensive preventive exam was done as well as an evaluation and management of chronic conditions .  During the course of the visit the patient was educated and counseled about appropriate screening and preventive services including :  diabetes screening, lipid analysis with projected  10 year  risk for CAD , nutrition counseling, breast, cervical and colorectal cancer screening, and recommended immunizations.  Printed recommendations for health maintenance screenings was given      Hyperlipidemia - Primary   Relevant Medications   aspirin EC 81 MG tablet   Other Relevant Orders   Lipid panel    Other Visit Diagnoses    Vaginitis and vulvovaginitis       Relevant Orders   Cytology - PAP   Other fatigue       Relevant Orders   Comprehensive metabolic panel   TSH   Encounter for immunization       Relevant Orders   Flu vaccine HIGH DOSE PF (Completed)      I am having Ms. Heron maintain her Vitamin D3, butalbital-acetaminophen-caffeine, losartan-hydrochlorothiazide, aspirin EC, Krill Oil, Magnesium Oxide, and ALIGN.  Meds ordered this encounter  Medications  . aspirin EC 81 MG tablet    Sig: Take 81 mg by mouth every other day.  Javier Docker Oil 500 MG CAPS    Sig: Take 500 mg by mouth every other day.  . Magnesium Oxide 200 MG TABS    Sig: Take 200 mg by mouth every other day.  . Probiotic Product (ALIGN) 4 MG CAPS    Sig: Take 1 capsule by mouth daily.    There are no discontinued medications.  Follow-up: No Follow-up on file.   Crecencio Mc, MD

## 2016-07-31 NOTE — Assessment & Plan Note (Signed)
PAP and screen for STDS done per patient request

## 2016-07-31 NOTE — Assessment & Plan Note (Signed)
Annual comprehensive preventive exam was done as well as an evaluation and management of chronic conditions .  During the course of the visit the patient was educated and counseled about appropriate screening and preventive services including :  diabetes screening, lipid analysis with projected  10 year  risk for CAD , nutrition counseling, breast, cervical and colorectal cancer screening, and recommended immunizations.  Printed recommendations for health maintenance screenings was given 

## 2016-07-31 NOTE — Assessment & Plan Note (Signed)
Well controlled on current regimen. Renal function stable, no changes today . Repeat labs due  Lab Results  Component Value Date   CREATININE 0.76 02/01/2016   Lab Results  Component Value Date   NA 141 02/01/2016   K 4.2 02/01/2016   CL 107 02/01/2016   CO2 27 02/01/2016

## 2016-07-31 NOTE — Assessment & Plan Note (Signed)
Symptoms aggravated by diet, now resolved.  No longer on PPI

## 2016-08-01 LAB — CYTOLOGY - PAP

## 2016-08-02 ENCOUNTER — Encounter: Payer: Self-pay | Admitting: Internal Medicine

## 2016-08-03 ENCOUNTER — Encounter: Payer: Self-pay | Admitting: Internal Medicine

## 2016-08-03 LAB — CERVICOVAGINAL ANCILLARY ONLY
Bacterial vaginitis: NEGATIVE
CANDIDA VAGINITIS: NEGATIVE

## 2016-08-08 ENCOUNTER — Other Ambulatory Visit: Payer: Self-pay | Admitting: Internal Medicine

## 2016-08-08 ENCOUNTER — Ambulatory Visit (INDEPENDENT_AMBULATORY_CARE_PROVIDER_SITE_OTHER): Payer: Medicare HMO

## 2016-08-08 VITALS — BP 136/70 | HR 70 | Temp 98.2°F | Resp 14 | Ht 62.0 in | Wt 157.8 lb

## 2016-08-08 DIAGNOSIS — Z79899 Other long term (current) drug therapy: Secondary | ICD-10-CM

## 2016-08-08 DIAGNOSIS — Z0000 Encounter for general adult medical examination without abnormal findings: Secondary | ICD-10-CM | POA: Diagnosis not present

## 2016-08-08 MED ORDER — SIMVASTATIN 20 MG PO TABS
20.0000 mg | ORAL_TABLET | Freq: Every day | ORAL | 3 refills | Status: DC
Start: 2016-08-08 — End: 2016-09-27

## 2016-08-08 NOTE — Progress Notes (Addendum)
Subjective:   Michele Fernandez is a 70 y.o. female who presents for Medicare Annual (Subsequent) preventive examination.  Review of Systems:  No ROS.  Medicare Wellness Visit.  Cardiac Risk Factors include: advanced age (>22men, >53 women);hypertension     Objective:     Vitals: BP 136/70 (BP Location: Left Arm, Patient Position: Sitting, Cuff Size: Normal)   Pulse 70   Temp 98.2 F (36.8 C) (Oral)   Resp 14   Ht 5\' 2"  (1.575 m)   Wt 157 lb 12.8 oz (71.6 kg)   SpO2 97%   BMI 28.86 kg/m   Body mass index is 28.86 kg/m.   Tobacco History  Smoking Status  . Former Smoker  . Packs/day: 1.00  . Years: 15.00  . Types: Cigarettes  . Quit date: 12/10/1972  Smokeless Tobacco  . Never Used     Counseling given: Not Answered   Past Medical History:  Diagnosis Date  . Cancer ([place])    skin  . GERD (gastroesophageal reflux disease)   . Hypertension   . Other sign and symptom in breast    Past Surgical History:  Procedure Laterality Date  . BREAST BIOPSY Bilateral 1993  . BREAST BIOPSY Left 1989  . CATARACT EXTRACTION W/PHACO Left 09/13/2015   Procedure: CATARACT EXTRACTION PHACO AND INTRAOCULAR LENS PLACEMENT (IOC);  Surgeon: Estill Cotta, MD;  Location: [facility] ORS;  Service: Ophthalmology;  Laterality: Left;  [id number] H US:01:22.4 AP%:26.1 CDE:37.12  . CEREBRAL ANEURYSM REPAIR  1991  . COLONOSCOPY  2014   [facility] [place]  . EYE SURGERY Right 11/2014  . FOOT SURGERY  2009, 2012  . [place]  2006  . UPPER GI ENDOSCOPY  2006   Family History  Problem Relation Age of Onset  . Alcohol abuse Son   . Cancer Maternal Aunt     ovarian  . Heart attack Maternal Grandmother   . Cancer Brother     lung ca,   sepsis pneumonia   History  Sexual Activity  . Sexual activity: Yes    Outpatient Encounter Prescriptions as of 08/08/2016  Medication Sig  . aspirin EC 81 MG tablet Take 81 mg by mouth every other day.  .  butalbital-acetaminophen-caffeine (FIORICET, ESGIC) 50-325-40 MG tablet Take 1 tablet by mouth 2 (two) times daily as needed for headache or migraine.  . Cholecalciferol (VITAMIN D3) 2000 units TABS Take 1 tablet by mouth daily.  Javier Docker Oil 500 MG CAPS Take 500 mg by mouth every other day.  . losartan-hydrochlorothiazide (HYZAAR) 50-12.5 MG tablet TAKE 1 TABLET EVERY DAY  . Magnesium Oxide 200 MG TABS Take 200 mg by mouth every other day.  . Probiotic Product (ALIGN) 4 MG CAPS Take 1 capsule by mouth daily.  . simvastatin (ZOCOR) 20 MG tablet Take 1 tablet (20 mg total) by mouth at bedtime.   No facility-administered encounter medications on file as of 08/08/2016.     Activities of Daily Living In your present state of health, do you have any difficulty performing the following activities: 08/08/2016  Hearing? N  Vision? N  Difficulty concentrating or making decisions? N  Walking or climbing stairs? N  Dressing or bathing? N  Doing errands, shopping? N  Preparing Food and eating ? N  Using the Toilet? N  In the past six months, have you accidently leaked urine? N  Do you have problems with loss of bowel control? N  Managing your Medications? N  Managing your Finances? N  Housekeeping or managing your Housekeeping? N  Some recent data might be hidden    Patient Care Team: Crecencio Mc, MD as PCP - General (Internal Medicine) Robert Bellow, MD (General Surgery) Juluis Pitch, MD as Referring Physician (Family Medicine)    Assessment:    This is a routine wellness examination for [facility]. The goal of the wellness visit is to assist the patient how to close the gaps in care and create a preventative care plan for the patient.   Taking calcium VIT D3 as appropriate/Osteoporosis risk reviewed.  Medications reviewed; taking without issues or barriers.  Safety issues reviewed; smoke and carbon monoxide detectors in the home. No firearms in the home. Wears seatbelts when  driving or riding with others. No violence in the home.  No identified risk were noted; The patient was oriented x 3; appropriate in dress and manner and no objective failures at ADL's or IADL's.   Body mass index; discussed the importance of a healthy diet, water intake and exercise. Educational material provided.  Health maintenance gaps; closed.  Future lab appointments scheduled.    Patient Concerns: None at this time. Follow up with PCP as needed.  Exercise Activities and Dietary recommendations Current Exercise Habits: Home exercise routine, Type of exercise: stretching;calisthenics, Time (Minutes): 30, Frequency (Times/Week): 5, Weekly Exercise (Minutes/Week): 150, Intensity: Moderate  Goals    . Healthy Lifestyle          Low carb foods. Lean meats, vegetables. Smaller portions. Stay hydrated and continue drinking plenty of water. Stay active and continue exercise regiment.      Fall Risk Fall Risk  08/08/2016 04/09/2015  Falls in the past year? No No   Depression Screen PHQ 2/9 Scores 08/08/2016 04/09/2015  PHQ - 2 Score 0 0     Cognitive Testing MMSE - Mini Mental State Exam 08/08/2016  Orientation to time 5  Orientation to Place 5  Registration 3  Attention/ Calculation 5  Recall 3  Language- name 2 objects 2  Language- repeat 1  Language- follow 3 step command 3  Language- read & follow direction 1  Write a sentence 1  Copy design 1  Total score 30    Immunization History  Administered Date(s) Administered  . Influenza, High Dose Seasonal PF 07/31/2016  . Influenza-Unspecified 09/03/2013, 09/17/2014, 09/06/2015  . Pneumococcal Conjugate-13 10/06/2013  . Pneumococcal Polysaccharide-23 04/09/2015  . Tdap 10/06/2006  . Zoster 12/01/2011   Screening Tests Health Maintenance  Topic Date Due  . TETANUS/TDAP  10/06/2016  . MAMMOGRAM  06/22/2017  . COLONOSCOPY  09/09/2023  . INFLUENZA VACCINE  Completed  . DEXA SCAN  Completed  . ZOSTAVAX  Completed  .  Hepatitis C Screening  Completed  . PNA vac Low Risk Adult  Completed      Plan:   End of life planning; Advance aging; Advanced directives discussed. Copy of current HCPOA/Living Will requested.  During the course of the visit the patient was educated and counseled about the following appropriate screening and preventive services:   Vaccines to include Pneumoccal, Influenza, Hepatitis B, Td, Zostavax, HCV  Electrocardiogram  Cardiovascular Disease  Colorectal cancer screening  Bone density screening  Diabetes screening  Glaucoma screening  Mammography/PAP  Nutrition counseling   Patient Instructions (the written plan) was given to the patient.   OBrien-Blaney, Solene Hereford L, LPN  [id number]     I have reviewed the above information and agree with above.   Deborra Medina, MD

## 2016-08-08 NOTE — Patient Instructions (Addendum)
Michele Fernandez , Thank you for taking time to come for your Medicare Wellness Visit. I appreciate your ongoing commitment to your health goals. Please review the following plan we discussed and let me know if I can assist you in the future.   FOLLOW UP WITH DR. Derrel Nip AS NEEDED.  These are the goals we discussed: Goals    . Healthy Lifestyle          Low carb foods. Lean meats, vegetables. Smaller portions. Stay hydrated and continue drinking plenty of water. Stay active and continue exercise regiment.       This is a list of the screening recommended for you and due dates:  Health Maintenance  Topic Date Due  . Tetanus Vaccine  10/06/2016  . Mammogram  06/22/2017  . Colon Cancer Screening  09/09/2023  . Flu Shot  Completed  . DEXA scan (bone density measurement)  Completed  . Shingles Vaccine  Completed  .  Hepatitis C: One time screening is recommended by Center for Disease Control  (CDC) for  adults born from [birth year] through 1963.   Completed  . Pneumonia vaccines  Completed   Fat and Cholesterol Restricted Diet High levels of fat and cholesterol in your blood may lead to various health problems, such as diseases of the heart, blood vessels, gallbladder, liver, and pancreas. Fats are concentrated sources of energy that come in various forms. Certain types of fat, including saturated fat, may be harmful in excess. Cholesterol is a substance needed by your body in small amounts. Your body makes all the cholesterol it needs. Excess cholesterol comes from the food you eat. When you have high levels of cholesterol and saturated fat in your blood, health problems can develop because the excess fat and cholesterol will gather along the walls of your blood vessels, causing them to narrow. Choosing the right foods will help you control your intake of fat and cholesterol. This will help keep the levels of these substances in your blood within normal limits and reduce your risk of disease. WHAT IS MY  PLAN? Your health care provider recommends that you:  Get no more than __________ % of the total calories in your daily diet from fat.  Limit your intake of saturated fat to less than ______% of your total calories each day.  Limit the amount of cholesterol in your diet to less than _________mg per day. WHAT TYPES OF FAT SHOULD I CHOOSE?  Choose healthy fats more often. Choose monounsaturated and polyunsaturated fats, such as olive and canola oil, flaxseeds, walnuts, almonds, and seeds.  Eat more omega-3 fats. Good choices include salmon, mackerel, sardines, tuna, flaxseed oil, and ground flaxseeds. Aim to eat fish at least two times a week.  Limit saturated fats. Saturated fats are primarily found in animal products, such as meats, butter, and cream. Plant sources of saturated fats include palm oil, palm kernel oil, and coconut oil.  Avoid foods with partially hydrogenated oils in them. These contain trans fats. Examples of foods that contain trans fats are stick margarine, some tub margarines, cookies, crackers, and other baked goods. WHAT GENERAL GUIDELINES DO I NEED TO FOLLOW? These guidelines for healthy eating will help you control your intake of fat and cholesterol:  Check food labels carefully to identify foods with trans fats or high amounts of saturated fat.  Fill one half of your plate with vegetables and green salads.  Fill one fourth of your plate with whole grains. Look for the word "whole" as  the first word in the ingredient list.  Fill one fourth of your plate with lean protein foods.  Limit fruit to two servings a day. Choose fruit instead of juice.  Eat more foods that contain soluble fiber. Examples of foods that contain this type of fiber are apples, broccoli, carrots, beans, peas, and barley. Aim to get 20-30 g of fiber per day.  Eat more home-cooked food and less restaurant, buffet, and fast food.  Limit or avoid alcohol.  Limit foods high in starch and  sugar.  Limit fried foods.  Cook foods using methods other than frying. Baking, boiling, grilling, and broiling are all great options.  Lose weight if you are overweight. Losing just 5-10% of your initial body weight can help your overall health and prevent diseases such as diabetes and heart disease. WHAT FOODS CAN I EAT? Grains Whole grains, such as whole wheat or whole grain breads, crackers, cereals, and pasta. Unsweetened oatmeal, bulgur, barley, quinoa, or brown rice. Corn or whole wheat flour tortillas. Vegetables Fresh or frozen vegetables (raw, steamed, roasted, or grilled). Green salads. Fruits All fresh, canned (in natural juice), or frozen fruits. Meat and Other Protein Products Ground beef (85% or leaner), grass-fed beef, or beef trimmed of fat. Skinless chicken or [place]. Ground chicken or [place]. Pork trimmed of fat. All fish and seafood. Eggs. Dried beans, peas, or lentils. Unsalted nuts or seeds. Unsalted canned or dry beans. Dairy Low-fat dairy products, such as skim or 1% milk, 2% or reduced-fat cheeses, low-fat ricotta or cottage cheese, or plain low-fat yogurt. Fats and Oils Tub margarines without trans fats. Light or reduced-fat mayonnaise and salad dressings. Avocado. Olive, canola, sesame, or safflower oils. Natural peanut or almond butter (choose ones without added sugar and oil). The items listed above may not be a complete list of recommended foods or beverages. Contact your dietitian for more options. WHAT FOODS ARE NOT RECOMMENDED? Grains White bread. White pasta. White rice. Cornbread. Bagels, pastries, and croissants. Crackers that contain trans fat. Vegetables White potatoes. Corn. Creamed or fried vegetables. Vegetables in a cheese sauce. Fruits Dried fruits. Canned fruit in light or heavy syrup. Fruit juice. Meat and Other Protein Products Fatty cuts of meat. Ribs, chicken wings, bacon, sausage, bologna, salami, chitterlings, fatback, hot dogs,  bratwurst, and packaged luncheon meats. Liver and organ meats. Dairy Whole or 2% milk, cream, half-and-half, and cream cheese. Whole milk cheeses. Whole-fat or sweetened yogurt. Full-fat cheeses. Nondairy creamers and whipped toppings. Processed cheese, cheese spreads, or cheese curds. Sweets and Desserts Corn syrup, sugars, honey, and molasses. Candy. Jam and jelly. Syrup. Sweetened cereals. Cookies, pies, cakes, donuts, muffins, and ice cream. Fats and Oils Butter, stick margarine, lard, shortening, ghee, or bacon fat. Coconut, palm kernel, or palm oils. Beverages Alcohol. Sweetened drinks (such as sodas, lemonade, and fruit drinks or punches). The items listed above may not be a complete list of foods and beverages to avoid. Contact your dietitian for more information.   This information is not intended to replace advice given to you by your health care provider. Make sure you discuss any questions you have with your health care provider.   Document Released: 11/20/2005 Document Revised: 12/11/2014 Document Reviewed: 02/18/2014 Elsevier Interactive Patient Education [employer].

## 2016-08-23 ENCOUNTER — Encounter: Payer: Self-pay | Admitting: General Surgery

## 2016-08-23 DIAGNOSIS — Z1231 Encounter for screening mammogram for malignant neoplasm of breast: Secondary | ICD-10-CM | POA: Diagnosis not present

## 2016-08-29 ENCOUNTER — Encounter: Payer: Self-pay | Admitting: General Surgery

## 2016-08-29 ENCOUNTER — Ambulatory Visit (INDEPENDENT_AMBULATORY_CARE_PROVIDER_SITE_OTHER): Payer: Medicare HMO | Admitting: General Surgery

## 2016-08-29 VITALS — BP 138/72 | HR 74 | Resp 12 | Ht 61.0 in | Wt 151.0 lb

## 2016-08-29 DIAGNOSIS — N63 Unspecified lump in breast: Secondary | ICD-10-CM

## 2016-08-29 DIAGNOSIS — M25512 Pain in left shoulder: Secondary | ICD-10-CM | POA: Diagnosis not present

## 2016-08-29 DIAGNOSIS — N6310 Unspecified lump in the right breast, unspecified quadrant: Secondary | ICD-10-CM

## 2016-08-29 NOTE — Progress Notes (Signed)
Patient ID: Michele Fernandez, female   DOB: 1946-03-18, 70 y.o.   MRN: [id number]  Chief Complaint  Patient presents with  . Follow-up    mammogram    HPI Michele Fernandez is a 70 y.o. female who presents for a breast evaluation. The most recent mammogram was done on 08/23/16.  Patient does perform regular self breast checks and gets regular mammograms done.Patient is having  trouble with raising her left arm up. This has been going on for about 2 weeks. No real improvement with localized application. She has been advised not to make use of anti-inflammatories secondary to her blood pressure.  HPI  Past Medical History:  Diagnosis Date  . Cancer ([place])    skin  . GERD (gastroesophageal reflux disease)   . Hypertension   . Other sign and symptom in breast     Past Surgical History:  Procedure Laterality Date  . BREAST BIOPSY Bilateral 1993  . BREAST BIOPSY Left 1989  . CATARACT EXTRACTION W/PHACO Left 09/13/2015   Procedure: CATARACT EXTRACTION PHACO AND INTRAOCULAR LENS PLACEMENT (IOC);  Surgeon: Estill Cotta, MD;  Location: [facility] ORS;  Service: Ophthalmology;  Laterality: Left;  [id number] H US:01:22.4 AP%:26.1 CDE:37.12  . CEREBRAL ANEURYSM REPAIR  1991  . COLONOSCOPY  2014   [facility] [place]  . EYE SURGERY Right 11/2014  . FOOT SURGERY  2009, 2012  . [place]  2006  . UPPER GI ENDOSCOPY  2006    Family History  Problem Relation Age of Onset  . Alcohol abuse Son   . Cancer Maternal Aunt     ovarian  . Heart attack Maternal Grandmother   . Cancer Brother     lung ca,   sepsis pneumonia    Social History Social History  Substance Use Topics  . Smoking status: Former Smoker    Packs/day: 1.00    Years: 15.00    Types: Cigarettes    Quit date: 12/10/1972  . Smokeless tobacco: Never Used  . Alcohol use Yes     Comment: rare    Allergies  Allergen Reactions  . Biaxin [Clarithromycin] Nausea Only    Metallic taste  . Demerol [Meperidine]  Nausea And Vomiting    IV Demerol Only  . 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    Current Outpatient Prescriptions  Medication Sig Dispense Refill  . aspirin EC 81 MG tablet Take 81 mg by mouth every other day.    . butalbital-acetaminophen-caffeine (FIORICET, ESGIC) 50-325-40 MG tablet Take 1 tablet by mouth 2 (two) times daily as needed for headache or migraine. 30 tablet 1  . Cholecalciferol (VITAMIN D3) 2000 units TABS Take 1 tablet by mouth daily.    Javier Docker Oil 500 MG CAPS Take 500 mg by mouth every other day.    . losartan-hydrochlorothiazide (HYZAAR) 50-12.5 MG tablet TAKE 1 TABLET EVERY DAY 30 tablet 5  . Magnesium Oxide 200 MG TABS Take 200 mg by mouth every other day.    . Probiotic Product (ALIGN) 4 MG CAPS Take 1 capsule by mouth daily.    . simvastatin (ZOCOR) 20 MG tablet Take 1 tablet (20 mg total) by mouth at bedtime. 90 tablet 3   No current facility-administered medications for this visit.     Review of Systems Review of Systems  Constitutional: Negative.   Respiratory: Negative.   Cardiovascular: Negative.     Blood pressure 138/72, pulse 74, resp. rate 12, height 5'  1" (1.549 m), weight 151 lb (68.5 kg).  Physical Exam Physical Exam  Constitutional: She is oriented to person, place, and time. She appears well-developed and well-nourished.  Eyes: Conjunctivae are normal. No scleral icterus.  Neck: Neck supple.  Cardiovascular: Normal rate, regular rhythm and normal heart sounds.   Pulmonary/Chest: Effort normal and breath sounds normal. Right breast exhibits no inverted nipple, no mass, no nipple discharge, no skin change and no tenderness. Left breast exhibits no inverted nipple, no mass, no nipple discharge, no skin change and no tenderness.    Right breast incision is well healed.  Abdominal: Soft. Normal appearance and bowel sounds are normal. There is no hepatomegaly. There is no tenderness.  Musculoskeletal:        Arms: Lymphadenopathy:    She has no cervical adenopathy.    She has no axillary adenopathy.  Neurological: She is alert and oriented to person, place, and time.  Skin: Skin is warm and dry.    Data Reviewed Bilateral screening mammograms completed at [facility]- dated 08/23/2016 were reviewed. Stable densities in the right breast. No interval change.  Right breast FNA 08/26/2015: Benign ductal cells. No evidence of malignancy.  Assessment    Benign breast exam.  Left shoulder pain unresponsive to conservative treatment, unable to make use of nonsteroidal secondary to blood pressure.    Plan    I think the patient would do well with orthopedic assessment. Her husband had seen Dr. Roland Rack in the past an appointment was made for her to see him on 09/08/2016 at 2:30 PM.  The patient desires to continue annual screening exams for her breasts are this office.   The patient has been asked to return to the office in one year with a bilateral screening mammogram.  This information has been scribed by Gaspar Cola CMA.   Robert Bellow 08/29/2016, 9:42 AM

## 2016-08-29 NOTE — Patient Instructions (Signed)
The patient has been asked to return to the office in one year with a bilateral screening mammogram. 

## 2016-09-08 DIAGNOSIS — M7582 Other shoulder lesions, left shoulder: Secondary | ICD-10-CM | POA: Diagnosis not present

## 2016-09-08 DIAGNOSIS — M25512 Pain in left shoulder: Secondary | ICD-10-CM | POA: Diagnosis not present

## 2016-09-08 DIAGNOSIS — M7552 Bursitis of left shoulder: Secondary | ICD-10-CM | POA: Diagnosis not present

## 2016-09-18 DIAGNOSIS — M25512 Pain in left shoulder: Secondary | ICD-10-CM | POA: Diagnosis not present

## 2016-09-19 ENCOUNTER — Other Ambulatory Visit (INDEPENDENT_AMBULATORY_CARE_PROVIDER_SITE_OTHER): Payer: Medicare HMO

## 2016-09-19 DIAGNOSIS — Z79899 Other long term (current) drug therapy: Secondary | ICD-10-CM

## 2016-09-19 LAB — COMPREHENSIVE METABOLIC PANEL
ALT: 22 U/L (ref 0–35)
AST: 17 U/L (ref 0–37)
Albumin: 4.2 g/dL (ref 3.5–5.2)
Alkaline Phosphatase: 50 U/L (ref 39–117)
BUN: 17 mg/dL (ref 6–23)
CO2: 27 mEq/L (ref 19–32)
Calcium: 9.1 mg/dL (ref 8.4–10.5)
Chloride: 106 mEq/L (ref 96–112)
Creatinine, Ser: 0.76 mg/dL (ref 0.40–1.20)
GFR: 80.44 mL/min (ref >60.00)
Glucose, Bld: 102 mg/dL — ABNORMAL HIGH (ref 70–99)
Potassium: 4 mEq/L (ref 3.5–5.1)
Sodium: 141 mEq/L (ref 135–145)
Total Bilirubin: 0.6 mg/dL (ref 0.2–1.2)
Total Protein: 6.3 g/dL (ref 6.0–8.3)

## 2016-09-21 ENCOUNTER — Encounter: Payer: Self-pay | Admitting: Internal Medicine

## 2016-09-25 ENCOUNTER — Other Ambulatory Visit: Payer: Self-pay | Admitting: Internal Medicine

## 2016-09-26 ENCOUNTER — Ambulatory Visit (INDEPENDENT_AMBULATORY_CARE_PROVIDER_SITE_OTHER): Payer: Medicare HMO | Admitting: Internal Medicine

## 2016-09-26 VITALS — BP 150/80 | HR 69 | Temp 97.9°F | Resp 12 | Ht 61.0 in | Wt 155.5 lb

## 2016-09-26 DIAGNOSIS — R1011 Right upper quadrant pain: Secondary | ICD-10-CM | POA: Diagnosis not present

## 2016-09-26 DIAGNOSIS — R109 Unspecified abdominal pain: Secondary | ICD-10-CM | POA: Diagnosis not present

## 2016-09-26 LAB — POCT URINALYSIS DIPSTICK
BILIRUBIN UA: NEGATIVE
Glucose, UA: NEGATIVE
KETONES UA: NEGATIVE
Nitrite, UA: NEGATIVE
PH UA: 6.5
Protein, UA: NEGATIVE
Spec Grav, UA: <=1.005
Urobilinogen, UA: 0.2

## 2016-09-26 MED ORDER — ESOMEPRAZOLE MAGNESIUM 40 MG PO CPDR
40.0000 mg | DELAYED_RELEASE_CAPSULE | Freq: Every day | ORAL | 3 refills | Status: DC
Start: 2016-09-26 — End: 2017-04-25

## 2016-09-26 NOTE — Progress Notes (Signed)
Subjective:  Patient ID: Michele Fernandez, female    DOB: Jun 07, 1946  Age: 70 y.o. MRN: [id number]  CC: The primary encounter diagnosis was Colicky RUQ abdominal pain. Diagnoses of Acute right flank pain and Continuous RUQ abdominal pain were also pertinent to this visit.  HPI Michele Fernandez presents for evaluation of RUQ pain associated with mild nausea, increased belching  For the past 10 days.  Pain radiates to her back and occasionally to her lower right flank .  The pain has become persistent , feels that it starts underneath the ribcageon the right . For the last day and half radiating to groin   Bowels have been moving twice daily for the past 3 days which is more than usual for her.  No diarrhea, no change in color .  No fevers.   Last abd CT was 2010, no gallstones noted     Outpatient Medications Prior to Visit  Medication Sig Dispense Refill  . aspirin EC 81 MG tablet Take 81 mg by mouth every other day.    . butalbital-acetaminophen-caffeine (FIORICET, ESGIC) 50-325-40 MG tablet Take 1 tablet by mouth 2 (two) times daily as needed for headache or migraine. 30 tablet 1  . Cholecalciferol (VITAMIN D3) 2000 units TABS Take 1 tablet by mouth daily.    Javier Docker Oil 500 MG CAPS Take 500 mg by mouth every other day.    . losartan-hydrochlorothiazide (HYZAAR) 50-12.5 MG tablet TAKE 1 TABLET EVERY DAY 30 tablet 5  . Magnesium Oxide 200 MG TABS Take 200 mg by mouth every other day.    . Probiotic Product (ALIGN) 4 MG CAPS Take 1 capsule by mouth daily.    . simvastatin (ZOCOR) 20 MG tablet Take 1 tablet (20 mg total) by mouth at bedtime. (Patient not taking: Reported on 09/26/2016) 90 tablet 3   No facility-administered medications prior to visit.     Review of Systems;  Patient denies headache, fevers, malaise, unintentional weight loss, skin rash, eye pain, sinus congestion and sinus pain, sore throat, dysphagia,  hemoptysis , cough, dyspnea, wheezing, chest pain,  palpitations, orthopnea, edema,, melena, diarrhea, constipation, flank pain, dysuria, hematuria, urinary  Frequency, nocturia, numbness, tingling, seizures,  Focal weakness, Loss of consciousness,  Tremor, insomnia, depression, anxiety, and suicidal ideation.      Objective:  BP (!) 150/80   Pulse 69   Temp 97.9 F (36.6 C) (Oral)   Resp 12   Ht 5\' 1"  (1.549 m)   Wt 155 lb 8 oz (70.5 kg)   SpO2 99%   BMI 29.38 kg/m   BP Readings from Last 3 Encounters:  09/26/16 (!) 150/80  08/29/16 138/72  08/08/16 136/70    Wt Readings from Last 3 Encounters:  09/26/16 155 lb 8 oz (70.5 kg)  08/29/16 151 lb (68.5 kg)  08/08/16 157 lb 12.8 oz (71.6 kg)    General appearance: alert, cooperative and appears stated age Ears: normal TM's and external ear canals both ears Throat: lips, mucosa, and tongue normal; teeth and gums normal Neck: no adenopathy, no carotid bruit, supple, symmetrical, trachea midline and thyroid not enlarged, symmetric, no tenderness/mass/nodules Back: symmetric, no curvature. ROM normal. No CVA tenderness. Lungs: clear to auscultation bilaterally Heart: regular rate and rhythm, S1, S2 normal, no murmur, click, rub or gallop Abdomen: soft, tender in RUQ without guarding , bowel sounds normal; no masses,  no organomegaly Pulses: 2+ and symmetric Skin: Skin color, texture, turgor normal. No rashes or lesions Lymph nodes: Cervical,  supraclavicular, and axillary nodes normal.  No results found for: HGBA1C  Lab Results  Component Value Date   CREATININE 0.76 09/19/2016   CREATININE 0.79 07/31/2016   CREATININE 0.76 02/01/2016    Lab Results  Component Value Date   WBC 9.0 09/26/2016   HGB 14.4 09/26/2016   HCT 42.6 09/26/2016   PLT 295.0 09/26/2016   GLUCOSE 102 (H) 09/19/2016   CHOL 221 (H) 07/31/2016   TRIG 81.0 07/31/2016   HDL 56.30 07/31/2016   LDLDIRECT 139.0 02/01/2016   LDLCALC 149 (H) 07/31/2016   ALT 17 09/26/2016   AST 17 09/26/2016   NA 141  09/19/2016   K 4.0 09/19/2016   CL 106 09/19/2016   CREATININE 0.76 09/19/2016   BUN 17 09/19/2016   CO2 27 09/19/2016   TSH 1.19 07/31/2016    No results found.  Assessment & Plan:   Problem List Items Addressed This Visit    Continuous RUQ abdominal pain    DDX includes choledocholithiasis, gastric ulcer, medication side effect (recently started simvastatin), renal calculi, and UTI.  Ultrasound was negative , hepatic function panel and UA were also normal .  Will treat for gastritis and follow .        Other Visit Diagnoses    Colicky RUQ abdominal pain    -  Primary   Relevant Orders   US Abdomen Complete (Completed)   Hepatic function panel (Completed)   Sedimentation rate (Completed)   CBC with Differential/Platelet (Completed)   Acute right flank pain       Relevant Orders   POCT urinalysis dipstick (Completed)   Urinalysis, Routine w reflex microscopic (not at [facility]) (Completed)   Urine culture (Completed)   CBC with Differential/Platelet (Completed)      I have discontinued Ms. Fludd's simvastatin. I am also having her start on esomeprazole. Additionally, I am having her maintain her Vitamin D3, butalbital-acetaminophen-caffeine, aspirin EC, Krill Oil, Magnesium Oxide, ALIGN, and losartan-hydrochlorothiazide.  Meds ordered this encounter  Medications  . esomeprazole (NEXIUM) 40 MG capsule    Sig: Take 1 capsule (40 mg total) by mouth daily.    Dispense:  30 capsule    Refill:  3    Medications Discontinued During This Encounter  Medication Reason  . simvastatin (ZOCOR) 20 MG tablet     Follow-up: No Follow-up on file.   Crecencio Mc, MD

## 2016-09-26 NOTE — Progress Notes (Signed)
Pre-visit discussion using our clinic review tool. No additional management support is needed unless otherwise documented below in the visit note.  

## 2016-09-26 NOTE — Patient Instructions (Signed)
Stop the Zocor  Resume Nexium   ultrasound will be set up for tomorrow to rule out gallstones.  If negative, we may need to do CT to rule out kidney stones

## 2016-09-27 ENCOUNTER — Encounter: Payer: Self-pay | Admitting: Internal Medicine

## 2016-09-27 ENCOUNTER — Ambulatory Visit
Admission: RE | Admit: 2016-09-27 | Discharge: 2016-09-27 | Disposition: A | Discharge status: Home / Self Care | Payer: Medicare HMO | Source: Ambulatory Visit | Attending: Internal Medicine | Admitting: Internal Medicine

## 2016-09-27 DIAGNOSIS — R1011 Right upper quadrant pain: Secondary | ICD-10-CM | POA: Diagnosis not present

## 2016-09-27 LAB — CBC WITH DIFFERENTIAL/PLATELET
BASOS ABS: 0.1 10*3/uL (ref 0.0–0.1)
Basophils Relative: 0.6 % (ref 0.0–3.0)
EOS ABS: 0.1 10*3/uL (ref 0.0–0.7)
Eosinophils Relative: 1.2 % (ref 0.0–5.0)
HCT: 42.6 % (ref 36.0–46.0)
HEMOGLOBIN: 14.4 g/dL (ref 12.0–15.0)
LYMPHS ABS: 2.3 10*3/uL (ref 0.7–4.0)
Lymphocytes Relative: 25.9 % (ref 12.0–46.0)
MCHC: 33.7 g/dL (ref 30.0–36.0)
MCV: 86.9 fl (ref 78.0–100.0)
MONO ABS: 0.5 10*3/uL (ref 0.1–1.0)
Monocytes Relative: 5.8 % (ref 3.0–12.0)
NEUTROS PCT: 66.5 % (ref 43.0–77.0)
Neutro Abs: 6 10*3/uL (ref 1.4–7.7)
Platelets: 295 10*3/uL (ref 150.0–400.0)
RBC: 4.9 Mil/uL (ref 3.87–5.11)
RDW: 13.3 % (ref 11.5–15.5)
WBC: 9 10*3/uL (ref 4.0–10.5)

## 2016-09-27 LAB — HEPATIC FUNCTION PANEL
ALBUMIN: 4.5 g/dL (ref 3.5–5.2)
ALT: 17 U/L (ref 0–35)
AST: 17 U/L (ref 0–37)
Alkaline Phosphatase: 51 U/L (ref 39–117)
BILIRUBIN TOTAL: 0.5 mg/dL (ref 0.2–1.2)
Bilirubin, Direct: 0.1 mg/dL (ref 0.0–0.3)
TOTAL PROTEIN: 7.1 g/dL (ref 6.0–8.3)

## 2016-09-27 LAB — URINALYSIS, ROUTINE W REFLEX MICROSCOPIC
BILIRUBIN URINE: NEGATIVE
KETONES UR: NEGATIVE
LEUKOCYTES UA: NEGATIVE
Nitrite: NEGATIVE
PH: 6.5 (ref 5.0–8.0)
Total Protein, Urine: NEGATIVE
URINE GLUCOSE: NEGATIVE
Urobilinogen, UA: 0.2 (ref 0.0–1.0)

## 2016-09-27 LAB — SEDIMENTATION RATE: SED RATE: 1 mm/h (ref 0–30)

## 2016-09-27 LAB — URINE CULTURE

## 2016-09-27 NOTE — Assessment & Plan Note (Addendum)
DDX includes choledocholithiasis, gastric ulcer, medication side effect (recently started simvastatin), renal calculi, and UTI.  Ultrasound was negative , hepatic function panel and UA were also normal .  Will treat for gastritis and follow .

## 2016-09-29 DIAGNOSIS — M25512 Pain in left shoulder: Secondary | ICD-10-CM | POA: Diagnosis not present

## 2016-10-04 DIAGNOSIS — M25512 Pain in left shoulder: Secondary | ICD-10-CM | POA: Diagnosis not present

## 2016-10-11 DIAGNOSIS — M25512 Pain in left shoulder: Secondary | ICD-10-CM | POA: Diagnosis not present

## 2016-10-30 ENCOUNTER — Encounter: Payer: Self-pay | Admitting: Internal Medicine

## 2016-11-03 DIAGNOSIS — M7582 Other shoulder lesions, left shoulder: Secondary | ICD-10-CM | POA: Diagnosis not present

## 2016-11-03 DIAGNOSIS — M7552 Bursitis of left shoulder: Secondary | ICD-10-CM | POA: Diagnosis not present

## 2017-02-14 ENCOUNTER — Telehealth: Payer: Self-pay | Admitting: Radiology

## 2017-02-14 DIAGNOSIS — E663 Overweight: Secondary | ICD-10-CM

## 2017-02-14 DIAGNOSIS — E559 Vitamin D deficiency, unspecified: Secondary | ICD-10-CM

## 2017-02-14 DIAGNOSIS — R5383 Other fatigue: Secondary | ICD-10-CM

## 2017-02-14 DIAGNOSIS — E7800 Pure hypercholesterolemia, unspecified: Secondary | ICD-10-CM

## 2017-02-14 DIAGNOSIS — I10 Essential (primary) hypertension: Secondary | ICD-10-CM

## 2017-02-14 NOTE — Telephone Encounter (Signed)
Fasting labs ordered

## 2017-02-14 NOTE — Addendum Note (Signed)
Addended by: Crecencio Mc on: 02/14/2017 01:28 PM   Modules accepted: Orders

## 2017-02-14 NOTE — Telephone Encounter (Signed)
Pt coming in for labs tomorrow, please place future orders. Thank you.  

## 2017-02-15 ENCOUNTER — Other Ambulatory Visit (INDEPENDENT_AMBULATORY_CARE_PROVIDER_SITE_OTHER): Payer: Medicare HMO

## 2017-02-15 DIAGNOSIS — E559 Vitamin D deficiency, unspecified: Secondary | ICD-10-CM | POA: Diagnosis not present

## 2017-02-15 DIAGNOSIS — E7800 Pure hypercholesterolemia, unspecified: Secondary | ICD-10-CM

## 2017-02-15 DIAGNOSIS — R5383 Other fatigue: Secondary | ICD-10-CM

## 2017-02-15 DIAGNOSIS — I10 Essential (primary) hypertension: Secondary | ICD-10-CM | POA: Diagnosis not present

## 2017-02-15 LAB — CBC WITH DIFFERENTIAL/PLATELET
BASOS PCT: 0.5 % (ref 0.0–3.0)
Basophils Absolute: 0 10*3/uL (ref 0.0–0.1)
EOS ABS: 0.2 10*3/uL (ref 0.0–0.7)
EOS PCT: 2.3 % (ref 0.0–5.0)
HCT: 43.6 % (ref 36.0–46.0)
HEMOGLOBIN: 14.3 g/dL (ref 12.0–15.0)
LYMPHS ABS: 2.9 10*3/uL (ref 0.7–4.0)
Lymphocytes Relative: 40.6 % (ref 12.0–46.0)
MCHC: 32.8 g/dL (ref 30.0–36.0)
MCV: 87.9 fl (ref 78.0–100.0)
MONO ABS: 0.6 10*3/uL (ref 0.1–1.0)
Monocytes Relative: 7.9 % (ref 3.0–12.0)
Neutro Abs: 3.5 10*3/uL (ref 1.4–7.7)
Neutrophils Relative %: 48.7 % (ref 43.0–77.0)
Platelets: 314 10*3/uL (ref 150.0–400.0)
RBC: 4.96 Mil/uL (ref 3.87–5.11)
RDW: 13.5 % (ref 11.5–15.5)
WBC: 7.1 10*3/uL (ref 4.0–10.5)

## 2017-02-15 LAB — VITAMIN D 25 HYDROXY (VIT D DEFICIENCY, FRACTURES): VITD: 41.6 ng/mL (ref 30.00–100.00)

## 2017-02-15 LAB — COMPREHENSIVE METABOLIC PANEL
ALT: 21 U/L (ref 0–35)
AST: 18 U/L (ref 0–37)
Albumin: 4.1 g/dL (ref 3.5–5.2)
Alkaline Phosphatase: 51 U/L (ref 39–117)
BILIRUBIN TOTAL: 0.4 mg/dL (ref 0.2–1.2)
BUN: 14 mg/dL (ref 6–23)
CHLORIDE: 107 meq/L (ref 96–112)
CO2: 29 meq/L (ref 19–32)
CREATININE: 0.84 mg/dL (ref 0.40–1.20)
Calcium: 9.3 mg/dL (ref 8.4–10.5)
GFR: 71.58 mL/min (ref >60.00)
Glucose, Bld: 93 mg/dL (ref 70–99)
Potassium: 4.3 mEq/L (ref 3.5–5.1)
SODIUM: 143 meq/L (ref 135–145)
Total Protein: 6.3 g/dL (ref 6.0–8.3)

## 2017-02-15 LAB — LIPID PANEL
CHOLESTEROL: 178 mg/dL (ref 0–200)
HDL: 62.4 mg/dL (ref >39.00)
LDL CALC: 98 mg/dL (ref 0–99)
NONHDL: 115.37
Total CHOL/HDL Ratio: 3
Triglycerides: 87 mg/dL (ref 0.0–149.0)
VLDL: 17.4 mg/dL (ref 0.0–40.0)

## 2017-02-15 LAB — TSH: TSH: 2.59 u[IU]/mL (ref 0.35–4.50)

## 2017-02-18 ENCOUNTER — Encounter: Payer: Self-pay | Admitting: Internal Medicine

## 2017-02-28 DIAGNOSIS — M79601 Pain in right arm: Secondary | ICD-10-CM | POA: Diagnosis not present

## 2017-02-28 DIAGNOSIS — S56911A Strain of unspecified muscles, fascia and tendons at forearm level, right arm, initial encounter: Secondary | ICD-10-CM | POA: Diagnosis not present

## 2017-04-02 ENCOUNTER — Other Ambulatory Visit: Payer: Self-pay | Admitting: Internal Medicine

## 2017-04-02 NOTE — Telephone Encounter (Signed)
Last app seen for this problems was 07-31-16 she does not have follow up is it ok to refill?

## 2017-04-02 NOTE — Telephone Encounter (Signed)
Ok to refill seince seh had labs in March ,  Needs 6 month follow up on BP.

## 2017-04-12 NOTE — Telephone Encounter (Signed)
meds called in pt has app

## 2017-04-25 ENCOUNTER — Ambulatory Visit (INDEPENDENT_AMBULATORY_CARE_PROVIDER_SITE_OTHER): Payer: Medicare HMO | Admitting: Internal Medicine

## 2017-04-25 ENCOUNTER — Encounter: Payer: Self-pay | Admitting: Internal Medicine

## 2017-04-25 VITALS — BP 116/78 | HR 71 | Temp 98.3°F | Resp 16 | Ht 61.0 in | Wt 155.0 lb

## 2017-04-25 DIAGNOSIS — B001 Herpesviral vesicular dermatitis: Secondary | ICD-10-CM | POA: Diagnosis not present

## 2017-04-25 DIAGNOSIS — R5383 Other fatigue: Secondary | ICD-10-CM | POA: Diagnosis not present

## 2017-04-25 DIAGNOSIS — K2100 Gastro-esophageal reflux disease with esophagitis, without bleeding: Secondary | ICD-10-CM

## 2017-04-25 DIAGNOSIS — Z23 Encounter for immunization: Secondary | ICD-10-CM

## 2017-04-25 DIAGNOSIS — E7800 Pure hypercholesterolemia, unspecified: Secondary | ICD-10-CM

## 2017-04-25 DIAGNOSIS — I10 Essential (primary) hypertension: Secondary | ICD-10-CM | POA: Diagnosis not present

## 2017-04-25 MED ORDER — ACYCLOVIR 400 MG PO TABS: 400.0000 mg | ORAL_TABLET | Freq: Every day | ORAL | 3 refills | Status: DC

## 2017-04-25 NOTE — Progress Notes (Signed)
Subjective:  Patient ID: Michele Fernandez, female    DOB: 10/16/1946  Age: 71 y.o. MRN: [id number]  CC: The primary encounter diagnosis was Essential hypertension. Diagnoses of Pure hypercholesterolemia, Fatigue, unspecified type, Need for vaccination with 13-polyvalent pneumococcal conjugate vaccine, Gastroesophageal reflux disease with esophagitis, and Cold sore were also pertinent to this visit.  HPI Michele Fernandez presents for  Follow up on hypertension and hyperlipidemia.  Patient is taking her medications as prescribed and notes no adverse effects.  Home BP readings have been done about once per week and are  generally < 130/80 .  She is avoiding added salt in her diet and walking regularly about 3 times per week for exercise  .  We reviewed her normal fasting labs done in March  2018    We reviewed her recurrent episodes of RUQ pain  That were occurring prior to her last viist: she underwent evaluation with an abdominal US , which was normal,  And normal hepatic function panel. .  Her pain has resolved spontaneously.  No longer taking nexium.  Feels the probiotic is helping.   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      Outpatient Medications Prior to Visit  Medication Sig Dispense Refill  . aspirin EC 81 MG tablet Take 81 mg by mouth every other day.    . butalbital-acetaminophen-caffeine (FIORICET, ESGIC) 50-325-40 MG tablet Take 1 tablet by mouth 2 (two) times daily as needed for headache or migraine. 30 tablet 1  . Cholecalciferol (VITAMIN D3) 2000 units TABS Take 1 tablet by mouth daily.    Javier Docker Oil 500 MG CAPS Take 500 mg by mouth every other day.    . Magnesium Oxide 200 MG TABS Take 200 mg by mouth every other day.    . Probiotic Product (ALIGN) 4 MG CAPS Take 1 capsule by mouth daily.    Marland Kitchen losartan-hydrochlorothiazide (HYZAAR) 50-12.5 MG tablet TAKE 1 TABLET BY MOUTH DAILY 30 tablet 0  . esomeprazole  (NEXIUM) 40 MG capsule Take 1 capsule (40 mg total) by mouth daily. (Patient not taking: Reported on 04/25/2017) 30 capsule 3   No facility-administered medications prior to visit.     Review of Systems;  Patient denies headache, fevers, malaise, unintentional weight loss, skin rash, eye pain, sinus congestion and sinus pain, sore throat, dysphagia,  hemoptysis , cough, dyspnea, wheezing, chest pain, palpitations, orthopnea, edema, abdominal pain, nausea, melena, diarrhea, constipation, flank pain, dysuria, hematuria, urinary  Frequency, nocturia, numbness, tingling, seizures,  Focal weakness, Loss of consciousness,  Tremor, insomnia, depression, anxiety, and suicidal ideation.      Objective:  BP 116/78 (BP Location: Left Arm, Patient Position: Sitting, Cuff Size: Normal)   Pulse 71   Temp 98.3 F (36.8 C) (Oral)   Resp 16   Ht 5\' 1"  (1.549 m)   Wt 155 lb (70.3 kg)   SpO2 97%   BMI 29.29 kg/m   BP Readings from Last 3 Encounters:  04/25/17 116/78  09/26/16 (!) 150/80  08/29/16 138/72    Wt Readings from Last 3 Encounters:  04/25/17 155 lb (70.3 kg)  09/26/16 155 lb 8 oz (70.5 kg)  08/29/16 151 lb (68.5 kg)    General appearance: alert, cooperative and appears stated age Ears: normal TM's and external ear canals both ears Throat: large blisterind cold sore right upper lip.   mucosa, and tongue normal; teeth and gums normal Neck: no  adenopathy, no carotid bruit, supple, symmetrical, trachea midline and thyroid not enlarged, symmetric, no tenderness/mass/nodules Back: symmetric, no curvature. ROM normal. No CVA tenderness. Lungs: clear to auscultation bilaterally Heart: regular rate and rhythm, S1, S2 normal, no murmur, click, rub or gallop Abdomen: soft, non-tender; bowel sounds normal; no masses,  no organomegaly Pulses: 2+ and symmetric Skin: Skin color, texture, turgor normal. No rashes or lesions Lymph nodes: Cervical, supraclavicular, and axillary nodes normal.  No  results found for: HGBA1C  Lab Results  Component Value Date   CREATININE 0.84 02/15/2017   CREATININE 0.76 09/19/2016   CREATININE 0.79 07/31/2016    Lab Results  Component Value Date   WBC 7.1 02/15/2017   HGB 14.3 02/15/2017   HCT 43.6 02/15/2017   PLT 314.0 02/15/2017   GLUCOSE 93 02/15/2017   CHOL 178 02/15/2017   TRIG 87.0 02/15/2017   HDL 62.40 02/15/2017   LDLDIRECT 139.0 02/01/2016   LDLCALC 98 02/15/2017   ALT 21 02/15/2017   AST 18 02/15/2017   NA 143 02/15/2017   K 4.3 02/15/2017   CL 107 02/15/2017   CREATININE 0.84 02/15/2017   BUN 14 02/15/2017   CO2 29 02/15/2017   TSH 2.59 02/15/2017    US Abdomen Complete  Result Date: 09/27/2016 CLINICAL DATA:  Right upper quadrant abdominal pain and nausea. EXAM: ABDOMEN ULTRASOUND COMPLETE COMPARISON:  CT of the abdomen on 11/18/2009 FINDINGS: Gallbladder: No gallstones or wall thickening visualized. No sonographic Murphy sign noted by sonographer. Common bile duct: Diameter: Normal caliber of 4 mm. Liver: No focal lesion identified. Within normal limits in parenchymal echogenicity. IVC: No abnormality visualized. Pancreas: Visualized portion unremarkable. Spleen: Size and appearance within normal limits. Small rounded accessory spleen inferior to the spleen and lateral to the left kidney measures approximately 1.4 cm in diameter and is stable in appearance compared to the prior CT scan. Right Kidney: Length: 10.6 cm. Echogenicity within normal limits. No mass or hydronephrosis visualized. Left Kidney: Length: 10.1 cm. Echogenicity within normal limits. No mass or hydronephrosis visualized. Abdominal aorta: No aneurysm visualized. Other findings: None. IMPRESSION: Normal abdominal ultrasound. Electronically Signed   By: Aletta Edouard M.D.   On: 09/27/2016 10:43    Assessment & Plan:   Problem List Items Addressed This Visit    Hypertension - Primary    Well controlled on current regimen. Renal function stable,  discussed suspending use of hctz during summer months to avoid leg cramps and dehdration.  She will continue losartan only .  Lab Results  Component Value Date   CREATININE 0.84 02/15/2017   Lab Results  Component Value Date   NA 143 02/15/2017   K 4.3 02/15/2017   CL 107 02/15/2017   CO2 29 02/15/2017         Relevant Medications   simvastatin (ZOCOR) 20 MG tablet   losartan (COZAAR) 50 MG tablet   Other Relevant Orders   Comprehensive metabolic panel   Hyperlipidemia    She has reduced her cholesterol and risk with diet and krill oil, per patient preference.   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  I have discontinued Ms. Mcree's esomeprazole and losartan-hydrochlorothiazide. I am also having her start on losartan. Additionally, I am having her maintain her Vitamin D3, butalbital-acetaminophen-caffeine, aspirin EC, Krill Oil, Magnesium Oxide, ALIGN, simvastatin, and acyclovir.  Meds ordered this encounter  Medications  . simvastatin (ZOCOR) 20 MG tablet  . DISCONTD: acyclovir  (ZOVIRAX) 400 MG tablet    Sig: Take 1 tablet (400 mg total) by mouth 5 (five) times daily.    Dispense:  35 tablet    Refill:  3  . acyclovir (ZOVIRAX) 400 MG tablet    Sig: Take 1 tablet (400 mg total) by mouth 5 (five) times daily.    Dispense:  35 tablet    Refill:  3  . losartan (COZAAR) 50 MG tablet    Sig: Take 1 tablet (50 mg total) by mouth daily.    Dispense:  90 tablet    Refill:  0    Medications Discontinued During This Encounter  Medication Reason  . esomeprazole (NEXIUM) 40 MG capsule Patient has not taken in last 30 days  . acyclovir (ZOVIRAX) 400 MG tablet Reorder  . losartan-hydrochlorothiazide (HYZAAR) 50-12.5 MG tablet     Follow-up: Return in about 6 months (around 10/26/2017), or CPE , labs in  september .   Crecencio Mc, MD

## 2017-04-25 NOTE — Patient Instructions (Addendum)
You can combine motrin (Advil) or aleve plus tylenol plus flexeril for back spasms/strains   Ok to use losaratan without hctz daily summer months     I have given you an rx for acyclovio to use the next time you feel a cold sore forming , to reduce the time to resolution

## 2017-04-28 DIAGNOSIS — B001 Herpesviral vesicular dermatitis: Secondary | ICD-10-CM | POA: Insufficient documentation

## 2017-04-28 MED ORDER — LOSARTAN POTASSIUM 50 MG PO TABS: 50.0000 mg | ORAL_TABLET | Freq: Every day | ORAL | 0 refills | Status: DC

## 2017-04-28 NOTE — Assessment & Plan Note (Signed)
Persistent,  Will treat with acyclovir

## 2017-04-28 NOTE — Assessment & Plan Note (Addendum)
She has reduced her cholesterol and risk with diet and krill oil, per patient preference.   Lab Results  Component Value Date   CHOL 178 02/15/2017   HDL 62.40 02/15/2017   LDLCALC 98 02/15/2017   LDLDIRECT 139.0 02/01/2016   TRIG 87.0 02/15/2017   CHOLHDL 3 02/15/2017

## 2017-04-28 NOTE — Assessment & Plan Note (Signed)
Well controlled on current regimen. Renal function stable, discussed suspending use of hctz during summer months to avoid leg cramps and dehdration.  She will continue losartan only .  Lab Results  Component Value Date   CREATININE 0.84 02/15/2017   Lab Results  Component Value Date   NA 143 02/15/2017   K 4.3 02/15/2017   CL 107 02/15/2017   CO2 29 02/15/2017

## 2017-04-28 NOTE — Assessment & Plan Note (Signed)
Resolved currenlty,  Without PPI

## 2017-05-03 ENCOUNTER — Other Ambulatory Visit: Payer: Self-pay | Admitting: Internal Medicine

## 2017-05-07 ENCOUNTER — Telehealth: Payer: Self-pay | Admitting: *Deleted

## 2017-05-07 MED ORDER — LOSARTAN POTASSIUM 50 MG PO TABS: 50.0000 mg | ORAL_TABLET | Freq: Every day | ORAL | 0 refills | Status: DC

## 2017-05-07 NOTE — Telephone Encounter (Signed)
Medication Refill requested for : Lasartan 90 day supply  Pharmacy:[facility]  Return Contact : [phone]

## 2017-05-07 NOTE — Telephone Encounter (Signed)
rx has been sent and pt has been notified.

## 2017-05-08 ENCOUNTER — Other Ambulatory Visit: Payer: Self-pay | Admitting: Internal Medicine

## 2017-05-08 MED ORDER — HYDROCHLOROTHIAZIDE 25 MG PO TABS: 25.0000 mg | ORAL_TABLET | Freq: Every day | ORAL | 0 refills | Status: DC

## 2017-05-08 NOTE — Telephone Encounter (Signed)
Spoke with pt and she stated that she would like to have the rx for the hctz sent in just incase she needs. Per Dr. Lupita Dawn message the hctz rx has been sent in and pt has been notified.

## 2017-05-08 NOTE — Telephone Encounter (Signed)
If she would review her after visit summary,  I advised her to use the losartan without  the hct for the summer months to prevent dehdyration and leg cramps  .  You can send in a separate rx for the hctz if she wants the option of taking it on certain days ,  25 mg

## 2017-05-08 NOTE — Telephone Encounter (Signed)
Pt called and stated that she spoke with the pharmacy and they do not have this rx. Can we please re-call it in? Please advise, thank you!

## 2017-05-08 NOTE — Addendum Note (Signed)
Addended by: Adair Laundry on: 05/08/2017 10:34 AM   Modules accepted: Orders

## 2017-05-08 NOTE — Telephone Encounter (Signed)
Spoke with pt to inform her that we had refilled her losartan 50mg  tablet. The pt stated that she needed the losartan/hctz refilled. Looking in the last office note(04/25/2017) it looks like the losartan/hctz was discontinued and the plain losartan was started. Explained that to the pt and she stated that it was to her understanding that she would continue taking the losartan/hctz but that if she needed the plain losartan through the summer she would have it. Should pt still be taking the losartan/hctz and is it ok to refill? Please advise.

## 2017-05-18 DIAGNOSIS — D225 Melanocytic nevi of trunk: Secondary | ICD-10-CM | POA: Diagnosis not present

## 2017-05-18 DIAGNOSIS — L821 Other seborrheic keratosis: Secondary | ICD-10-CM | POA: Diagnosis not present

## 2017-05-18 DIAGNOSIS — D2261 Melanocytic nevi of right upper limb, including shoulder: Secondary | ICD-10-CM | POA: Diagnosis not present

## 2017-05-18 DIAGNOSIS — D2272 Melanocytic nevi of left lower limb, including hip: Secondary | ICD-10-CM | POA: Diagnosis not present

## 2017-07-02 ENCOUNTER — Encounter: Payer: Self-pay | Admitting: Internal Medicine

## 2017-07-02 ENCOUNTER — Telehealth: Payer: Self-pay | Admitting: Internal Medicine

## 2017-07-02 NOTE — Telephone Encounter (Signed)
Spoke with [facility] and they stated that the information we had was not correct, but were not very helpful in correcting it.  I spoke with the patient.  She read off the prescription bottles to me, but the Rx's werent very clear, we agreed that if she called [facility] and asked for the Rx to be sent to [place] to be refilled it would be a seamless process, so patient is going to call them and have it faxed here. thanks

## 2017-07-02 NOTE — Telephone Encounter (Signed)
CORRECTION.  [facility] FOR THE COMPOUNDED ESTRADIOL AND PROGESTERONE (SEE E MAIL

## 2017-07-02 NOTE — Telephone Encounter (Signed)
See patient's e mail.  I will refill the compounded medications,  But they will have to be called into [place]

## 2017-07-03 ENCOUNTER — Other Ambulatory Visit: Payer: Self-pay

## 2017-07-03 NOTE — Telephone Encounter (Signed)
Received prescription fax. Called [facility] and had the prescriptions refilled exactly like they were filled the last time.   Medications:   Progesterone 2% In Versa  Sig: Use as directed  Estriol 1mg  vaginal tablet  Sig: one tablet per vagina two to three times weekly as directed.

## 2017-08-07 ENCOUNTER — Other Ambulatory Visit: Payer: Self-pay | Admitting: Internal Medicine

## 2017-08-08 ENCOUNTER — Ambulatory Visit: Payer: Medicare HMO

## 2017-08-21 ENCOUNTER — Other Ambulatory Visit (INDEPENDENT_AMBULATORY_CARE_PROVIDER_SITE_OTHER): Payer: Medicare HMO

## 2017-08-21 DIAGNOSIS — R5383 Other fatigue: Secondary | ICD-10-CM | POA: Diagnosis not present

## 2017-08-21 DIAGNOSIS — I10 Essential (primary) hypertension: Secondary | ICD-10-CM

## 2017-08-21 DIAGNOSIS — E7800 Pure hypercholesterolemia, unspecified: Secondary | ICD-10-CM

## 2017-08-21 LAB — CBC WITH DIFFERENTIAL/PLATELET
Basophils Absolute: 0 10*3/uL (ref 0.0–0.1)
Basophils Relative: 0.6 % (ref 0.0–3.0)
EOS PCT: 1.7 % (ref 0.0–5.0)
Eosinophils Absolute: 0.1 10*3/uL (ref 0.0–0.7)
HCT: 42.6 % (ref 36.0–46.0)
Hemoglobin: 14.1 g/dL (ref 12.0–15.0)
LYMPHS ABS: 2.4 10*3/uL (ref 0.7–4.0)
Lymphocytes Relative: 37.3 % (ref 12.0–46.0)
MCHC: 33 g/dL (ref 30.0–36.0)
MCV: 88.3 fl (ref 78.0–100.0)
MONO ABS: 0.5 10*3/uL (ref 0.1–1.0)
MONOS PCT: 8.5 % (ref 3.0–12.0)
NEUTROS ABS: 3.3 10*3/uL (ref 1.4–7.7)
NEUTROS PCT: 51.9 % (ref 43.0–77.0)
PLATELETS: 273 10*3/uL (ref 150.0–400.0)
RBC: 4.83 Mil/uL (ref 3.87–5.11)
RDW: 13.8 % (ref 11.5–15.5)
WBC: 6.4 10*3/uL (ref 4.0–10.5)

## 2017-08-21 LAB — TSH: TSH: 3.07 u[IU]/mL (ref 0.35–4.50)

## 2017-08-21 LAB — COMPREHENSIVE METABOLIC PANEL
ALBUMIN: 4 g/dL (ref 3.5–5.2)
ALK PHOS: 53 U/L (ref 39–117)
ALT: 17 U/L (ref 0–35)
AST: 17 U/L (ref 0–37)
BUN: 17 mg/dL (ref 6–23)
CALCIUM: 9.1 mg/dL (ref 8.4–10.5)
CO2: 27 mEq/L (ref 19–32)
Chloride: 108 mEq/L (ref 96–112)
Creatinine, Ser: 0.81 mg/dL (ref 0.40–1.20)
GFR: 74.54 mL/min (ref >60.00)
Glucose, Bld: 86 mg/dL (ref 70–99)
POTASSIUM: 4.5 meq/L (ref 3.5–5.1)
Sodium: 142 mEq/L (ref 135–145)
TOTAL PROTEIN: 6.4 g/dL (ref 6.0–8.3)
Total Bilirubin: 0.4 mg/dL (ref 0.2–1.2)

## 2017-08-21 LAB — LIPID PANEL
CHOLESTEROL: 156 mg/dL (ref 0–200)
HDL: 73.9 mg/dL (ref >39.00)
LDL Cholesterol: 67 mg/dL (ref 0–99)
NONHDL: 82.57
TRIGLYCERIDES: 80 mg/dL (ref 0.0–149.0)
Total CHOL/HDL Ratio: 2
VLDL: 16 mg/dL (ref 0.0–40.0)

## 2017-08-24 ENCOUNTER — Encounter: Payer: Self-pay | Admitting: Internal Medicine

## 2017-08-27 DIAGNOSIS — Z1231 Encounter for screening mammogram for malignant neoplasm of breast: Secondary | ICD-10-CM | POA: Diagnosis not present

## 2017-08-29 ENCOUNTER — Encounter: Payer: Self-pay | Admitting: General Surgery

## 2017-09-03 ENCOUNTER — Encounter: Payer: Self-pay | Admitting: General Surgery

## 2017-09-03 ENCOUNTER — Ambulatory Visit (INDEPENDENT_AMBULATORY_CARE_PROVIDER_SITE_OTHER): Payer: Medicare HMO | Admitting: General Surgery

## 2017-09-03 VITALS — BP 154/80 | HR 81 | Resp 12 | Ht 61.0 in | Wt 161.0 lb

## 2017-09-03 DIAGNOSIS — N6311 Unspecified lump in the right breast, upper outer quadrant: Secondary | ICD-10-CM | POA: Diagnosis not present

## 2017-09-03 DIAGNOSIS — Z1239 Encounter for other screening for malignant neoplasm of breast: Secondary | ICD-10-CM

## 2017-09-03 DIAGNOSIS — Z1231 Encounter for screening mammogram for malignant neoplasm of breast: Secondary | ICD-10-CM

## 2017-09-03 NOTE — Patient Instructions (Signed)
Follow up in one year  Continue self breast exams. Call office for any new breast issues or concerns.  

## 2017-09-03 NOTE — Progress Notes (Signed)
Patient ID: Michele Fernandez, female   DOB: 04-14-1946, 71 y.o.   MRN: [id number]  Chief Complaint  Patient presents with  . Follow-up    mammogram    HPI Michele Fernandez is a 71 y.o. female who presents for a breast evaluation. The most recent mammogram was done on 08/27/17/. Patient does perform regular self breast checks and gets regular mammograms done. She has no new breast problems.  She had an ultrasound of the abdomen done for abdominal pain and nausea in October last year. She stated Zantac and the symptoms resolved, except for very mild occasional gas pain.  She reports her left shoulder pain is improved.   HPI  Past Medical History:  Diagnosis Date  . Cancer ([place])    skin  . GERD (gastroesophageal reflux disease)   . Hypertension   . Other sign and symptom in breast     Past Surgical History:  Procedure Laterality Date  . BREAST BIOPSY Bilateral 1993  . BREAST BIOPSY Left 1989  . CATARACT EXTRACTION W/PHACO Left 09/13/2015   Procedure: CATARACT EXTRACTION PHACO AND INTRAOCULAR LENS PLACEMENT (IOC);  Surgeon: Estill Cotta, MD;  Location: [facility] ORS;  Service: Ophthalmology;  Laterality: Left;  VQM#[id number] H US:01:22.4 AP%:26.1 CDE:37.12  . CEREBRAL ANEURYSM REPAIR  1991  . COLONOSCOPY  2014   [facility], benign colonic mucosa.  [facility] EYE SURGERY Right 11/2014  . FOOT SURGERY  2009, 2012  . [place]  2006  . UPPER GI ENDOSCOPY  2006    Family History  Problem Relation Age of Onset  . Alcohol abuse Son   . Cancer Maternal Aunt        ovarian  . Heart attack Maternal Grandmother   . Cancer Brother        lung ca,   sepsis pneumonia    Social History Social History  Substance Use Topics  . Smoking status: Former Smoker    Packs/day: 1.00    Years: 15.00    Types: Cigarettes    Quit date: 12/10/1972  . Smokeless tobacco: Never Used  . Alcohol use Yes     Comment: rare    Allergies  Allergen Reactions  . Biaxin [Clarithromycin]  Nausea Only    Metallic taste  . Demerol [Meperidine] Nausea And Vomiting    IV Demerol Only  . 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    Current Outpatient Prescriptions  Medication Sig Dispense Refill  . aspirin EC 81 MG tablet Take 81 mg by mouth every other day.    . butalbital-acetaminophen-caffeine (FIORICET, ESGIC) 50-325-40 MG tablet Take 1 tablet by mouth 2 (two) times daily as needed for headache or migraine. 30 tablet 1  . Cholecalciferol (VITAMIN D3) 2000 units TABS Take 1 tablet by mouth daily.    . hydrochlorothiazide (HYDRODIURIL) 25 MG tablet Take 1 tablet (25 mg total) by mouth daily. 30 tablet 0  . Krill Oil 500 MG CAPS Take 500 mg by mouth every other day.    . losartan (COZAAR) 50 MG tablet TAKE 1 TABLET BY MOUTH DAILY 90 tablet 1  . Magnesium Oxide 200 MG TABS Take 200 mg by mouth every other day.    . Probiotic Product (ALIGN) 4 MG CAPS Take 1 capsule by mouth daily.    . simvastatin (ZOCOR) 20 MG tablet      No current facility-administered medications for this visit.     Review of Systems Review of  Systems  Constitutional: Negative.   Respiratory: Negative.   Cardiovascular: Negative.     Blood pressure (!) 154/80, pulse 81, resp. rate 12, height 5\' 1"  (1.549 m), weight 161 lb (73 kg).  Physical Exam Physical Exam  Constitutional: She is oriented to person, place, and time. She appears well-developed and well-nourished.  Eyes: Conjunctivae are normal. No scleral icterus.  Neck: Neck supple.  Cardiovascular: Normal rate, regular rhythm and normal heart sounds.   Pulmonary/Chest: Effort normal and breath sounds normal. Right breast exhibits no inverted nipple, no mass, no nipple discharge, no skin change and no tenderness. Left breast exhibits no inverted nipple, no mass, no nipple discharge, no skin change and no tenderness.     Lymphadenopathy:    She has no cervical adenopathy.    She has no axillary adenopathy.   Neurological: She is alert and oriented to person, place, and time.  Skin: Skin is warm and dry.  Psychiatric: She has a normal mood and affect.    Data Reviewed Bilateral screening mammograms dated 08/27/2017 completed at [facility] showed stable smoothly marginated rounded densities bilaterally. Previous ultrasound showed majority of these to represent simple cyst, others showed benign, planned ductal cells. BI-RADS-2.  Assessment    Benign breast exam.    Plan    Patient desires to continue annual breast screening through this office.    HPI, Physical Exam, Assessment and Plan have been scribed under the direction and in the presence of Robert Bellow, MD  Concepcion Living, LPN  I have completed the exam and reviewed the above documentation for accuracy and completeness.  I agree with the above.  Haematologist has been used and any errors in dictation or transcription are unintentional.  Hervey Ard, M.D., F.A.C.S.   Robert Bellow 09/03/2017, 9:36 PM

## 2017-09-28 ENCOUNTER — Ambulatory Visit (INDEPENDENT_AMBULATORY_CARE_PROVIDER_SITE_OTHER): Payer: Medicare HMO

## 2017-09-28 DIAGNOSIS — Z23 Encounter for immunization: Secondary | ICD-10-CM | POA: Diagnosis not present

## 2017-10-01 ENCOUNTER — Other Ambulatory Visit: Payer: Self-pay | Admitting: Internal Medicine

## 2017-10-03 ENCOUNTER — Telehealth: Payer: Self-pay | Admitting: Internal Medicine

## 2017-10-03 NOTE — Telephone Encounter (Signed)
Pt declined AWV. °

## 2017-10-26 ENCOUNTER — Encounter: Payer: Medicare HMO | Admitting: Internal Medicine

## 2017-10-31 ENCOUNTER — Ambulatory Visit (INDEPENDENT_AMBULATORY_CARE_PROVIDER_SITE_OTHER): Payer: Medicare HMO | Admitting: Internal Medicine

## 2017-10-31 ENCOUNTER — Encounter: Payer: Self-pay | Admitting: Internal Medicine

## 2017-10-31 VITALS — BP 146/70 | HR 74 | Temp 98.2°F | Resp 15 | Ht 61.0 in | Wt 162.4 lb

## 2017-10-31 DIAGNOSIS — Z0000 Encounter for general adult medical examination without abnormal findings: Secondary | ICD-10-CM

## 2017-10-31 DIAGNOSIS — I10 Essential (primary) hypertension: Secondary | ICD-10-CM | POA: Diagnosis not present

## 2017-10-31 MED ORDER — ZOSTER VAC RECOMB ADJUVANTED 50 MCG/0.5ML IM SUSR: 0.5000 mL | Freq: Once | INTRAMUSCULAR | 1 refills | Status: AC

## 2017-10-31 MED ORDER — TETANUS-DIPHTH-ACELL PERTUSSIS 5-2.5-18.5 LF-MCG/0.5 IM SUSP: 0.5000 mL | Freq: Once | INTRAMUSCULAR | 0 refills | Status: AC

## 2017-10-31 NOTE — Progress Notes (Signed)
Patient ID: Michele Fernandez, female    DOB: 03-21-1946  Age: 71 y.o. MRN: [id number]  The patient is here for annual PREVENTIVE examination and management of other chronic and acute problems.   BREAST EXAM AND MAMMOGRAM DONE  DUE FOR EYE EXAM  The risk factors are reflected in the social history.  The roster of all physicians providing medical care to patient - is listed in the Snapshot section of the chart.  Activities of daily living:  The patient is 100% independent in all ADLs: dressing, toileting, feeding as well as independent mobility  Home safety : The patient has smoke detectors in the home. They wear seatbelts.  There are no firearms at home. There is no violence in the home.   There is no risks for hepatitis, STDs or HIV. There is no   history of blood transfusion. They have no travel history to infectious disease endemic areas of the world.  The patient has seen their dentist in the last six month. They have seen their eye doctor in the last year.   Discussed the need for sun protection: hats, long sleeves and use of sunscreen if there is significant sun exposure.   Diet: the importance of a healthy diet is discussed. They do have a healthy diet.  The benefits of regular aerobic exercise were discussed. She walks 4 times per week ,  20 minutes.   Depression screen: there are no signs or vegative symptoms of depression- irritability, change in appetite, anhedonia, sadness/tearfullness.  Cognitive assessment: the patient manages all their financial and personal affairs and is actively engaged. They could relate day,date,year and events; recalled 2/3 objects at 3 minutes; performed clock-face test normally.  The following portions of the patient's history were reviewed and updated as appropriate: allergies, current medications, past family history, past medical history,  past surgical history, past social history  and problem list.  Visual acuity was not assessed per  patient preference since she has regular follow up with her ophthalmologist. Hearing and body mass index were assessed and reviewed.   During the course of the visit the patient was educated and counseled about appropriate screening and preventive services including : fall prevention , diabetes screening, nutrition counseling, colorectal cancer screening, and recommended immunizations.    CC: Diagnoses of Essential hypertension and Encounter for preventive health examination were pertinent to this visit.  History Michele Fernandez has a past medical history of Cancer ([place]), GERD (gastroesophageal reflux disease), Hypertension, and Other sign and symptom in breast.   She has a past surgical history that includes Breast biopsy (Bilateral, 1993); Breast biopsy (Left, 1989); Upper gi endoscopy (2006); Hammer toe surgery (2006); Foot surgery (2009, 2012); Cerebral aneurysm repair (1991); Colonoscopy (2014); Eye surgery (Right, 11/2014); and Cataract extraction w/PHACO (Left, 09/13/2015).   Her family history includes Alcohol abuse in her son; Cancer in her brother and maternal aunt; Heart attack in her maternal grandmother.She reports that she quit smoking about 44 years ago. Her smoking use included cigarettes. She has a 15.00 pack-year smoking history. she has never used smokeless tobacco. She reports that she drinks alcohol. She reports that she does not use drugs.  Outpatient Medications Prior to Visit  Medication Sig Dispense Refill  . aspirin EC 81 MG tablet Take 81 mg by mouth every other day.    . butalbital-acetaminophen-caffeine (FIORICET, ESGIC) 50-325-40 MG tablet Take 1 tablet by mouth 2 (two) times daily as needed for headache or migraine. 30 tablet 1  . Cholecalciferol (VITAMIN D3)  2000 units TABS Take 1 tablet by mouth daily.    . hydrochlorothiazide (HYDRODIURIL) 25 MG tablet Take 1 tablet (25 mg total) by mouth daily. 30 tablet 0  . losartan (COZAAR) 50 MG tablet TAKE 1 TABLET BY MOUTH DAILY  90 tablet 1  . Magnesium Oxide 200 MG TABS Take 200 mg by mouth every other day.    . simvastatin (ZOCOR) 20 MG tablet TAKE 1 TABLET BY MOUTH AT BEDTIME 90 tablet 1  . Probiotic Product (ALIGN) 4 MG CAPS Take 1 capsule by mouth daily.    Javier Docker Oil 500 MG CAPS Take 500 mg by mouth every other day.    . simvastatin (ZOCOR) 20 MG tablet      No facility-administered medications prior to visit.     Review of Systems  Patient denies headache, fevers, malaise, unintentional weight loss, skin rash, eye pain, sinus congestion and sinus pain, sore throat, dysphagia,  hemoptysis , cough, dyspnea, wheezing, chest pain, palpitations, orthopnea, edema, abdominal pain, nausea, melena, diarrhea, constipation, flank pain, dysuria, hematuria, urinary  Frequency, nocturia, numbness, tingling, seizures,  Focal weakness, Loss of consciousness,  Tremor, insomnia, depression, anxiety, and suicidal ideation.     Objective:  BP (!) 146/70 (BP Location: Left Arm, Patient Position: Sitting, Cuff Size: Normal)   Pulse 74   Temp 98.2 F (36.8 C) (Oral)   Resp 15   Ht 5\' 1"  (1.549 m)   Wt 162 lb 6.4 oz (73.7 kg)   SpO2 96%   BMI 30.69 kg/m   Physical Exam  General appearance: alert, cooperative and appears stated age Head: Normocephalic, without obvious abnormality, atraumatic Eyes: conjunctivae/corneas clear. PERRL, EOM's intact. Fundi benign. Ears: normal TM's and external ear canals both ears Nose: Nares normal. Septum midline. Mucosa normal. No drainage or sinus tenderness. Throat: lips, mucosa, and tongue normal; teeth and gums normal Neck: no adenopathy, no carotid bruit, no JVD, supple, symmetrical, trachea midline and thyroid not enlarged, symmetric, no tenderness/mass/nodules Lungs: clear to auscultation bilaterally Heart: regular rate and rhythm, S1, S2 normal, no murmur, click, rub or gallop Abdomen: soft, non-tender; bowel sounds normal; no masses,  no organomegaly Extremities: extremities  normal, atraumatic, no cyanosis or edema Pulses: 2+ and symmetric Skin: Skin color, texture, turgor normal. No rashes or lesions Neurologic: Alert and oriented X 3, normal strength and tone. Normal symmetric reflexes. Normal coordination and gait.     Assessment & Plan:   Problem List Items Addressed This Visit    Encounter for preventive health examination    Annual comprehensive preventive exam was done as well as an evaluation and management of chronic conditions .  During the course of the visit the patient was educated and counseled about appropriate screening and preventive services including :  diabetes screening, lipid analysis with projected  10 year  risk for CAD , nutrition counseling, breast, cervical and colorectal cancer screening, and recommended immunizations.  Printed recommendations for health maintenance screenings was given      Hypertension    She reports compliance with medication regimen  but has an elevated reading today in office. She has been asked to check her BP at work and  submit readings for evaluation. Renal function has been checked and is normal .   Lab Results  Component Value Date   CREATININE 0.81 08/21/2017   Lab Results  Component Value Date   NA 142 08/21/2017   K 4.5 08/21/2017   CL 108 08/21/2017   CO2 27 08/21/2017  I have discontinued Michele Fernandez "Connie"'s [employer]. I am also having her start on Tdap and Zoster Vaccine Adjuvanted. Additionally, I am having her maintain her Vitamin D3, butalbital-acetaminophen-caffeine, aspirin EC, Magnesium Oxide, ALIGN, hydrochlorothiazide, losartan, and simvastatin.  Meds ordered this encounter  Medications  . Tdap (BOOSTRIX) 5-2.5-18.5 LF-MCG/0.5 injection    Sig: Inject 0.5 mLs into the muscle once for 1 dose.    Dispense:  0.5 mL    Refill:  0  . Zoster Vaccine Adjuvanted [facility]) injection    Sig: Inject 0.5 mLs into the muscle once for 1 dose.    Dispense:  1 each     Refill:  1    Medications Discontinued During This Encounter  Medication Reason  . Krill Oil 500 MG CAPS Patient has not taken in last 30 days  . simvastatin (ZOCOR) 20 MG tablet Duplicate    Follow-up: No Follow-up on file.   Crecencio Mc, MD

## 2017-10-31 NOTE — Patient Instructions (Addendum)
GOOD  TO SEE YOU !  You are due for TDaP, however: The TDap is $110 out of pocket since medicare will not pay for it,    Most pharmacies will offer it as well for far less  ([employer] [facility];y offers it for $30 to 60 as the Health Dept.)   The ShingRx vaccine  Is the new , more effective Shingles vaccine and IS ADVISED for all interested adults over 50 to prevent shingles   YOUR FINAL PNEUMONIA VACCINE IS NOT DUE UNTIL 2021  If your bp is not 130/80 in a month,  Let me know   WE Should repeat fasting labs in March   Health Maintenance for Postmenopausal Women Menopause is a normal process in which your reproductive ability comes to an end. This process happens gradually over a span of months to years, usually between the ages of 63 and 57. Menopause is complete when you have missed 12 consecutive menstrual periods. It is important to talk with your health care provider about some of the most common conditions that affect postmenopausal women, such as heart disease, cancer, and bone loss (osteoporosis). Adopting a healthy lifestyle and getting preventive care can help to promote your health and wellness. Those actions can also lower your chances of developing some of these common conditions. What should I know about menopause? During menopause, you may experience a number of symptoms, such as:  Moderate-to-severe hot flashes.  Night sweats.  Decrease in sex drive.  Mood swings.  Headaches.  Tiredness.  Irritability.  Memory problems.  Insomnia.  Choosing to treat or not to treat menopausal changes is an individual decision that you make with your health care provider. What should I know about hormone replacement therapy and supplements? Hormone therapy products are effective for treating symptoms that are associated with menopause, such as hot flashes and night sweats. Hormone replacement carries certain risks, especially as you become older. If you are thinking about using  estrogen or estrogen with progestin treatments, discuss the benefits and risks with your health care provider. What should I know about heart disease and stroke? Heart disease, heart attack, and stroke become more likely as you age. This may be due, in part, to the hormonal changes that your body experiences during menopause. These can affect how your body processes dietary fats, triglycerides, and cholesterol. Heart attack and stroke are both medical emergencies. There are many things that you can do to help prevent heart disease and stroke:  Have your blood pressure checked at least every 1-2 years. High blood pressure causes heart disease and increases the risk of stroke.  If you are 61-79 years old, ask your health care provider if you should take aspirin to prevent a heart attack or a stroke.  Do not use any tobacco products, including cigarettes, chewing tobacco, or electronic cigarettes. If you need help quitting, ask your health care provider.  It is important to eat a healthy diet and maintain a healthy weight. ? Be sure to include plenty of vegetables, fruits, low-fat dairy products, and lean protein. ? Avoid eating foods that are high in solid fats, added sugars, or salt (sodium).  Get regular exercise. This is one of the most important things that you can do for your health. ? Try to exercise for at least 150 minutes each week. The type of exercise that you do should increase your heart rate and make you sweat. This is known as moderate-intensity exercise. ? Try to do strengthening exercises at  least twice each week. Do these in addition to the moderate-intensity exercise.  Know your numbers.Ask your health care provider to check your cholesterol and your blood glucose. Continue to have your blood tested as directed by your health care provider.  What should I know about cancer screening? There are several types of cancer. Take the following steps to reduce your risk and to catch  any cancer development as early as possible. Breast Cancer  Practice breast self-awareness. ? This means understanding how your breasts normally appear and feel. ? It also means doing regular breast self-exams. Let your health care provider know about any changes, no matter how small.  If you are 55 or older, have a clinician do a breast exam (clinical breast exam or CBE) every year. Depending on your age, family history, and medical history, it may be recommended that you also have a yearly breast X-ray (mammogram).  If you have a family history of breast cancer, talk with your health care provider about genetic screening.  If you are at high risk for breast cancer, talk with your health care provider about having an MRI and a mammogram every year.  Breast cancer (BRCA) gene test is recommended for women who have family members with BRCA-related cancers. Results of the assessment will determine the need for genetic counseling and BRCA1 and for BRCA2 testing. BRCA-related cancers include these types: ? Breast. This occurs in males or females. ? Ovarian. ? Tubal. This may also be called fallopian tube cancer. ? Cancer of the abdominal or pelvic lining (peritoneal cancer). ? Prostate. ? Pancreatic.  Cervical, Uterine, and Ovarian Cancer Your health care provider may recommend that you be screened regularly for cancer of the pelvic organs. These include your ovaries, uterus, and vagina. This screening involves a pelvic exam, which includes checking for microscopic changes to the surface of your cervix (Pap test).  For women ages 21-65, health care providers may recommend a pelvic exam and a Pap test every three years. For women ages 75-65, they may recommend the Pap test and pelvic exam, combined with testing for human papilloma virus (HPV), every five years. Some types of HPV increase your risk of cervical cancer. Testing for HPV may also be done on women of any age who have unclear Pap test  results.  Other health care providers may not recommend any screening for nonpregnant women who are considered low risk for pelvic cancer and have no symptoms. Ask your health care provider if a screening pelvic exam is right for you.  If you have had past treatment for cervical cancer or a condition that could lead to cancer, you need Pap tests and screening for cancer for at least 20 years after your treatment. If Pap tests have been discontinued for you, your risk factors (such as having a new sexual partner) need to be reassessed to determine if you should start having screenings again. Some women have medical problems that increase the chance of getting cervical cancer. In these cases, your health care provider may recommend that you have screening and Pap tests more often.  If you have a family history of uterine cancer or ovarian cancer, talk with your health care provider about genetic screening.  If you have vaginal bleeding after reaching menopause, tell your health care provider.  There are currently no reliable tests available to screen for ovarian cancer.  Lung Cancer Lung cancer screening is recommended for adults 16-29 years old who are at high risk for lung cancer  because of a history of smoking. A yearly low-dose CT scan of the lungs is recommended if you:  Currently smoke.  Have a history of at least 30 pack-years of smoking and you currently smoke or have quit within the past 15 years. A pack-year is smoking an average of one pack of cigarettes per day for one year.  Yearly screening should:  Continue until it has been 15 years since you quit.  Stop if you develop a health problem that would prevent you from having lung cancer treatment.  Colorectal Cancer  This type of cancer can be detected and can often be prevented.  Routine colorectal cancer screening usually begins at age 64 and continues through age 66.  If you have risk factors for colon cancer, your health  care provider may recommend that you be screened at an earlier age.  If you have a family history of colorectal cancer, talk with your health care provider about genetic screening.  Your health care provider may also recommend using home test kits to check for hidden blood in your stool.  A small camera at the end of a tube can be used to examine your colon directly (sigmoidoscopy or colonoscopy). This is done to check for the earliest forms of colorectal cancer.  Direct examination of the colon should be repeated every 5-10 years until age 40. However, if early forms of precancerous polyps or small growths are found or if you have a family history or genetic risk for colorectal cancer, you may need to be screened more often.  Skin Cancer  Check your skin from head to toe regularly.  Monitor any moles. Be sure to tell your health care provider: ? About any new moles or changes in moles, especially if there is a change in a mole's shape or color. ? If you have a mole that is larger than the size of a pencil eraser.  If any of your family members has a history of skin cancer, especially at a young age, talk with your health care provider about genetic screening.  Always use sunscreen. Apply sunscreen liberally and repeatedly throughout the day.  Whenever you are outside, protect yourself by wearing long sleeves, pants, a wide-brimmed hat, and sunglasses.  What should I know about osteoporosis? Osteoporosis is a condition in which bone destruction happens more quickly than new bone creation. After menopause, you may be at an increased risk for osteoporosis. To help prevent osteoporosis or the bone fractures that can happen because of osteoporosis, the following is recommended:  If you are 57-23 years old, get at least 1,000 mg of calcium and at least 600 mg of vitamin D per day.  If you are older than age 79 but younger than age 54, get at least 1,200 mg of calcium and at least 600 mg of  vitamin D per day.  If you are older than age 99, get at least 1,200 mg of calcium and at least 800 mg of vitamin D per day.  Smoking and excessive alcohol intake increase the risk of osteoporosis. Eat foods that are rich in calcium and vitamin D, and do weight-bearing exercises several times each week as directed by your health care provider. What should I know about how menopause affects my mental health? Depression may occur at any age, but it is more common as you become older. Common symptoms of depression include:  Low or sad mood.  Changes in sleep patterns.  Changes in appetite or eating patterns.  Feeling an overall lack of motivation or enjoyment of activities that you previously enjoyed.  Frequent crying spells.  Talk with your health care provider if you think that you are experiencing depression. What should I know about immunizations? It is important that you get and maintain your immunizations. These include:  Tetanus, diphtheria, and pertussis (Tdap) booster vaccine.  Influenza every year before the flu season begins.  Pneumonia vaccine.  Shingles vaccine.  Your health care provider may also recommend other immunizations. This information is not intended to replace advice given to you by your health care provider. Make sure you discuss any questions you have with your health care provider. Document Released: 01/12/2006 Document Revised: 06/09/2016 Document Reviewed: 08/24/2015 Elsevier Interactive Patient Education  2018 [employer].

## 2017-11-03 ENCOUNTER — Encounter: Payer: Self-pay | Admitting: Internal Medicine

## 2017-11-03 NOTE — Assessment & Plan Note (Signed)
She reports compliance with medication regimen  but has an elevated reading today in office. She has been asked to check her BP at work and  submit readings for evaluation. Renal function has been checked and is normal .   Lab Results  Component Value Date   CREATININE 0.81 08/21/2017   Lab Results  Component Value Date   NA 142 08/21/2017   K 4.5 08/21/2017   CL 108 08/21/2017   CO2 27 08/21/2017

## 2017-11-03 NOTE — Assessment & Plan Note (Signed)
Annual comprehensive preventive exam was done as well as an evaluation and management of chronic conditions .  During the course of the visit the patient was educated and counseled about appropriate screening and preventive services including :  diabetes screening, lipid analysis with projected  10 year  risk for CAD , nutrition counseling, breast, cervical and colorectal cancer screening, and recommended immunizations.  Printed recommendations for health maintenance screenings was given 

## 2017-11-08 DIAGNOSIS — H25043 Posterior subcapsular polar age-related cataract, bilateral: Secondary | ICD-10-CM | POA: Diagnosis not present

## 2017-11-21 ENCOUNTER — Encounter: Payer: Self-pay | Admitting: Internal Medicine

## 2017-11-21 ENCOUNTER — Other Ambulatory Visit: Payer: Self-pay | Admitting: Internal Medicine

## 2017-11-21 MED ORDER — CYCLOBENZAPRINE HCL 10 MG PO TABS: 10.0000 mg | ORAL_TABLET | Freq: Three times a day (TID) | ORAL | 2 refills | Status: DC | PRN

## 2017-11-29 ENCOUNTER — Encounter: Payer: Self-pay | Admitting: Internal Medicine

## 2017-11-29 MED ORDER — LOSARTAN POTASSIUM-HCTZ 50-12.5 MG PO TABS: 1.0000 | ORAL_TABLET | Freq: Every day | ORAL | 0 refills | Status: DC

## 2018-01-17 ENCOUNTER — Encounter: Payer: Self-pay | Admitting: Internal Medicine

## 2018-01-17 ENCOUNTER — Ambulatory Visit: Payer: Medicare HMO | Admitting: Internal Medicine

## 2018-01-17 VITALS — BP 152/74 | HR 84 | Temp 99.2°F | Ht 61.5 in | Wt 163.0 lb

## 2018-01-17 DIAGNOSIS — J101 Influenza due to other identified influenza virus with other respiratory manifestations: Secondary | ICD-10-CM | POA: Diagnosis not present

## 2018-01-17 DIAGNOSIS — B079 Viral wart, unspecified: Secondary | ICD-10-CM

## 2018-01-17 DIAGNOSIS — R6889 Other general symptoms and signs: Secondary | ICD-10-CM

## 2018-01-17 LAB — POC INFLUENZA A&B (BINAX/QUICKVUE)
INFLUENZA A, POC: POSITIVE — AB
Influenza B, POC: NEGATIVE

## 2018-01-17 MED ORDER — OSELTAMIVIR PHOSPHATE 75 MG PO CAPS: 75.0000 mg | ORAL_CAPSULE | Freq: Two times a day (BID) | ORAL | 0 refills | Status: DC

## 2018-01-17 NOTE — Patient Instructions (Signed)
Take tamiflu 2x per day x 5 days  Call back if not better  Take care we hope you feel better   Influenza, Adult Influenza, more commonly known as "the flu," is a viral infection that primarily affects the respiratory tract. The respiratory tract includes organs that help you breathe, such as the lungs, nose, and throat. The flu causes many common cold symptoms, as well as a high fever and body aches. The flu spreads easily from person to person (is contagious). Getting a flu shot (influenza vaccination) every year is the best way to prevent influenza. What are the causes? Influenza is caused by a virus. You can catch the virus by:  Breathing in droplets from an infected person's cough or sneeze.  Touching something that was recently contaminated with the virus and then touching your mouth, nose, or eyes.  What increases the risk? The following factors may make you more likely to get the flu:  Not cleaning your hands frequently with soap and water or alcohol-based hand sanitizer.  Having close contact with many people during cold and flu season.  Touching your mouth, eyes, or nose without washing or sanitizing your hands first.  Not drinking enough fluids or not eating a healthy diet.  Not getting enough sleep or exercise.  Being under a high amount of stress.  Not getting a yearly (annual) flu shot.  You may be at a higher risk of complications from the flu, such as a severe lung infection (pneumonia), if you:  Are over the age of 25.  Are pregnant.  Have a weakened disease-fighting system (immune system). You may have a weakened immune system if you: ? Have HIV or AIDS. ? Are undergoing chemotherapy. ? Aretaking medicines that reduce the activity of (suppress) the immune system.  Have a long-term (chronic) illness, such as heart disease, kidney disease, diabetes, or lung disease.  Have a liver disorder.  Are obese.  Have anemia.  What are the signs or  symptoms? Symptoms of this condition typically last 4-10 days and may include:  Fever.  Chills.  Headache, body aches, or muscle aches.  Sore throat.  Cough.  Runny or congested nose.  Chest discomfort and cough.  Poor appetite.  Weakness or tiredness (fatigue).  Dizziness.  Nausea or vomiting.  How is this diagnosed? This condition may be diagnosed based on your medical history and a physical exam. Your health care provider may do a nose or throat swab test to confirm the diagnosis. How is this treated? If influenza is detected early, you can be treated with antiviral medicine that can reduce the length of your illness and the severity of your symptoms. This medicine may be given by mouth (orally) or through an IV tube that is inserted in one of your veins. The goal of treatment is to relieve symptoms by taking care of yourself at home. This may include taking over-the-counter medicines, drinking plenty of fluids, and adding humidity to the air in your home. In some cases, influenza goes away on its own. Severe influenza or complications from influenza may be treated in a hospital. Follow these instructions at home:  Take over-the-counter and prescription medicines only as told by your health care provider.  Use a cool mist humidifier to add humidity to the air in your home. This can make breathing easier.  Rest as needed.  Drink enough fluid to keep your urine clear or pale yellow.  Cover your mouth and nose when you cough or sneeze.  Wash your hands with soap and water often, especially after you cough or sneeze. If soap and water are not available, use hand sanitizer.  Stay home from work or school as told by your health care provider. Unless you are visiting your health care provider, try to avoid leaving home until your fever has been gone for 24 hours without the use of medicine.  Keep all follow-up visits as told by your health care provider. This is  important. How is this prevented?  Getting an annual flu shot is the best way to avoid getting the flu. You may get the flu shot in late summer, fall, or winter. Ask your health care provider when you should get your flu shot.  Wash your hands often or use hand sanitizer often.  Avoid contact with people who are sick during cold and flu season.  Eat a healthy diet, drink plenty of fluids, get enough sleep, and exercise regularly. Contact a health care provider if:  You develop new symptoms.  You have: ? Chest pain. ? Diarrhea. ? A fever.  Your cough gets worse.  You produce more mucus.  You feel nauseous or you vomit. Get help right away if:  You develop shortness of breath or difficulty breathing.  Your skin or nails turn a bluish color.  You have severe pain or stiffness in your neck.  You develop a sudden headache or sudden pain in your face or ear.  You cannot stop vomiting. This information is not intended to replace advice given to you by your health care provider. Make sure you discuss any questions you have with your health care provider. Document Released: 11/17/2000 Document Revised: 04/27/2016 Document Reviewed: 09/14/2015 Elsevier Interactive Patient Education  2017 [employer].

## 2018-01-17 NOTE — Progress Notes (Signed)
Pre visit review using our clinic review tool, if applicable. No additional management support is needed unless otherwise documented below in the visit note. 

## 2018-01-17 NOTE — Progress Notes (Signed)
Chief Complaint  Patient presents with  . Cough  . Fever  . Chills   Sick visit  1. C/o cough, chills fever 102, rattling in her chest, fatigue, body aches she had a sick relative visit her and 1 day later her sx's started total 4 days. Flu A + today husband also having sxs. She did get the flu shot this year    Review of Systems  Constitutional: Positive for chills, fever and malaise/fatigue.  Respiratory: Positive for cough.   Cardiovascular: Negative for chest pain.  Skin: Negative for rash.  Neurological: Negative for headaches.   Past Medical History:  Diagnosis Date  . Cancer ([place])    skin  . GERD (gastroesophageal reflux disease)   . Hypertension   . Other sign and symptom in breast    Past Surgical History:  Procedure Laterality Date  . BREAST BIOPSY Bilateral 1993  . BREAST BIOPSY Left 1989  . CATARACT EXTRACTION W/PHACO Left 09/13/2015   Procedure: CATARACT EXTRACTION PHACO AND INTRAOCULAR LENS PLACEMENT (IOC);  Surgeon: Estill Cotta, MD;  Location: [facility] ORS;  Service: Ophthalmology;  Laterality: Left;  CBJ#[id number] H US:01:22.4 AP%:26.1 CDE:37.12  . CEREBRAL ANEURYSM REPAIR  1991  . COLONOSCOPY  2014   [facility], benign colonic mucosa.  Marland Kitchen EYE SURGERY Right 11/2014  . FOOT SURGERY  2009, 2012  . [place]  2006  . UPPER GI ENDOSCOPY  2006   Family History  Problem Relation Age of Onset  . Alcohol abuse Son   . Cancer Maternal Aunt        ovarian  . Heart attack Maternal Grandmother   . Cancer Brother        lung ca,   sepsis pneumonia   Social History   Socioeconomic History  . Marital status: Married    Spouse name: Not on file  . Number of children: Not on file  . Years of education: Not on file  . Highest education level: Not on file  Social Needs  . Financial resource strain: Not on file  . Food insecurity - worry: Not on file  . Food insecurity - inability: Not on file  . Transportation needs - medical: Not on file    . Transportation needs - non-medical: Not on file  Occupational History  . Not on file  Tobacco Use  . Smoking status: Former Smoker    Packs/day: 1.00    Years: 15.00    Pack years: 15.00    Types: Cigarettes    Last attempt to quit: 12/10/1972    Years since quitting: 45.1  . Smokeless tobacco: Never Used  Substance and Sexual Activity  . Alcohol use: Yes    Comment: rare  . Drug use: No  . Sexual activity: Yes  Other Topics Concern  . Not on file  Social History Narrative   Married lives with husband    Current Meds  Medication Sig  . aspirin EC 81 MG tablet Take 81 mg by mouth every other day.  . butalbital-acetaminophen-caffeine (FIORICET, ESGIC) 50-325-40 MG tablet Take 1 tablet by mouth 2 (two) times daily as needed for headache or migraine.  . Cholecalciferol (VITAMIN D3) 2000 units TABS Take 1 tablet by mouth daily.  . cyclobenzaprine (FLEXERIL) 10 MG tablet Take 1 tablet (10 mg total) by mouth 3 (three) times daily as needed for muscle spasms.  . hydrochlorothiazide (HYDRODIURIL) 25 MG tablet Take 1 tablet (25 mg total) by mouth daily.  Marland Kitchen losartan (COZAAR) 50 MG  tablet TAKE 1 TABLET BY MOUTH DAILY  . losartan-hydrochlorothiazide (HYZAAR) 50-12.5 MG tablet Take 1 tablet by mouth daily.  . Magnesium Oxide 200 MG TABS Take 200 mg by mouth every other day.  . Probiotic Product (ALIGN) 4 MG CAPS Take 1 capsule by mouth daily.  . simvastatin (ZOCOR) 20 MG tablet TAKE 1 TABLET BY MOUTH AT BEDTIME   Allergies  Allergen Reactions  . Biaxin [Clarithromycin] Nausea Only    Metallic taste  . Demerol [Meperidine] Nausea And Vomiting    IV Demerol Only  . 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   Recent Results (from the past 2160 hour(s))  POC Influenza A&B (Binax test)     Status: Abnormal   Collection Time: 01/17/18 10:28 AM  Result Value Ref Range   Influenza A, POC Positive (A) Negative   Influenza B, POC Negative Negative    Objective  Body mass index is 30.3 kg/m. Wt Readings from Last 3 Encounters:  01/17/18 163 lb (73.9 kg)  10/31/17 162 lb 6.4 oz (73.7 kg)  09/03/17 161 lb (73 kg)   Temp Readings from Last 3 Encounters:  01/17/18 99.2 F (37.3 C) (Oral)  10/31/17 98.2 F (36.8 C) (Oral)  04/25/17 98.3 F (36.8 C) (Oral)   BP Readings from Last 3 Encounters:  01/17/18 (!) 152/74  10/31/17 (!) 146/70  09/03/17 (!) 154/80   Pulse Readings from Last 3 Encounters:  01/17/18 84  10/31/17 74  09/03/17 81    Physical Exam  Constitutional: She is oriented to person, place, and time and well-developed, well-nourished, and in no distress.  HENT:  Head: Normocephalic and atraumatic.  Eyes: Conjunctivae are normal. Pupils are equal, round, and reactive to light.  Cardiovascular: Normal rate, regular rhythm and normal heart sounds.  Pulmonary/Chest: Effort normal and breath sounds normal. She has no wheezes.  Neurological: She is alert and oriented to person, place, and time. Gait normal. Gait normal.  Skin: Skin is warm and dry.     Psychiatric: Mood, memory, affect and judgment normal.  Nursing note and vitals reviewed.   Assessment   1. Influenza A  2. VV wart to left upper back  Plan  1. Supportive care, tamiflu  RTC if not better  2. Pt needs to f/u with dermatology tbse  Provider: Dr. Olivia Mackie McLean-Scocuzza-[facility]

## 2018-01-19 ENCOUNTER — Encounter: Payer: Self-pay | Admitting: Internal Medicine

## 2018-02-06 ENCOUNTER — Other Ambulatory Visit: Payer: Self-pay | Admitting: Internal Medicine

## 2018-02-18 ENCOUNTER — Telehealth: Payer: Self-pay | Admitting: Radiology

## 2018-02-18 DIAGNOSIS — E7800 Pure hypercholesterolemia, unspecified: Secondary | ICD-10-CM

## 2018-02-18 DIAGNOSIS — I10 Essential (primary) hypertension: Secondary | ICD-10-CM

## 2018-02-18 NOTE — Telephone Encounter (Signed)
Pt coming in for labs tomorrow, please place future orders. Thank you.  

## 2018-02-18 NOTE — Addendum Note (Signed)
Addended by: Crecencio Mc on: 02/18/2018 12:41 PM   Modules accepted: Orders

## 2018-02-19 ENCOUNTER — Other Ambulatory Visit (INDEPENDENT_AMBULATORY_CARE_PROVIDER_SITE_OTHER): Payer: Medicare HMO

## 2018-02-19 ENCOUNTER — Other Ambulatory Visit: Payer: Medicare HMO

## 2018-02-19 DIAGNOSIS — I10 Essential (primary) hypertension: Secondary | ICD-10-CM | POA: Diagnosis not present

## 2018-02-19 DIAGNOSIS — E7800 Pure hypercholesterolemia, unspecified: Secondary | ICD-10-CM

## 2018-02-19 LAB — COMPREHENSIVE METABOLIC PANEL
ALBUMIN: 3.9 g/dL (ref 3.5–5.2)
ALT: 20 U/L (ref 0–35)
AST: 18 U/L (ref 0–37)
Alkaline Phosphatase: 55 U/L (ref 39–117)
BILIRUBIN TOTAL: 0.4 mg/dL (ref 0.2–1.2)
BUN: 15 mg/dL (ref 6–23)
CHLORIDE: 108 meq/L (ref 96–112)
CO2: 27 meq/L (ref 19–32)
CREATININE: 0.85 mg/dL (ref 0.40–1.20)
Calcium: 9.1 mg/dL (ref 8.4–10.5)
GFR: 70.4 mL/min (ref >60.00)
Glucose, Bld: 91 mg/dL (ref 70–99)
Potassium: 4.3 mEq/L (ref 3.5–5.1)
SODIUM: 141 meq/L (ref 135–145)
Total Protein: 6.2 g/dL (ref 6.0–8.3)

## 2018-02-19 LAB — LIPID PANEL
CHOL/HDL RATIO: 3
Cholesterol: 155 mg/dL (ref 0–200)
HDL: 52.7 mg/dL (ref >39.00)
LDL CALC: 83 mg/dL (ref 0–99)
NonHDL: 101.82
Triglycerides: 94 mg/dL (ref 0.0–149.0)
VLDL: 18.8 mg/dL (ref 0.0–40.0)

## 2018-02-27 ENCOUNTER — Other Ambulatory Visit: Payer: Self-pay

## 2018-02-27 IMAGING — US US ABDOMEN COMPLETE
1 series · 14 of 25 positions shown · non-contrast
Comparison: CT of the abdomen on 11/18/2009

CLINICAL DATA: Right upper quadrant abdominal pain and nausea.

EXAM:
ABDOMEN ULTRASOUND COMPLETE

[Series 1: us abdomen complete · 0.16mm/px · 14 of 122 slices shown]
[im 1/122]
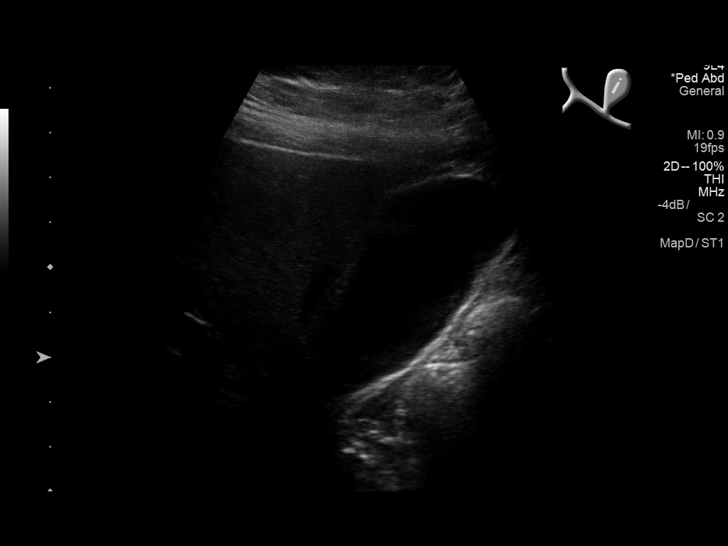
[im 11/122]
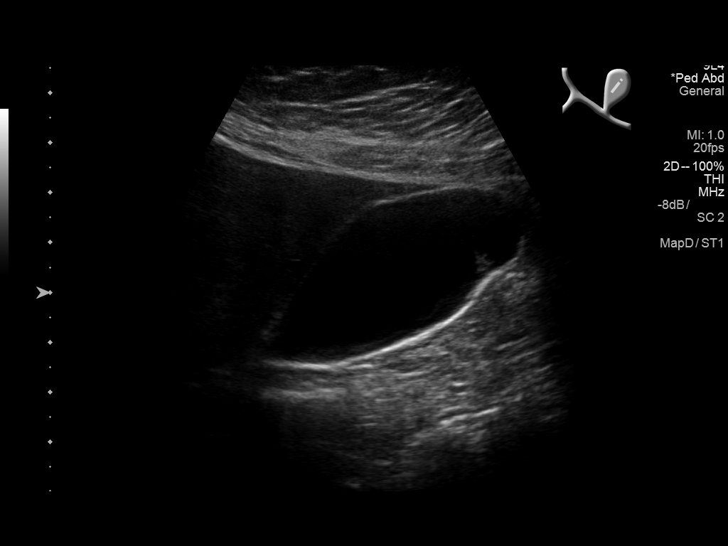
[im 21/122]
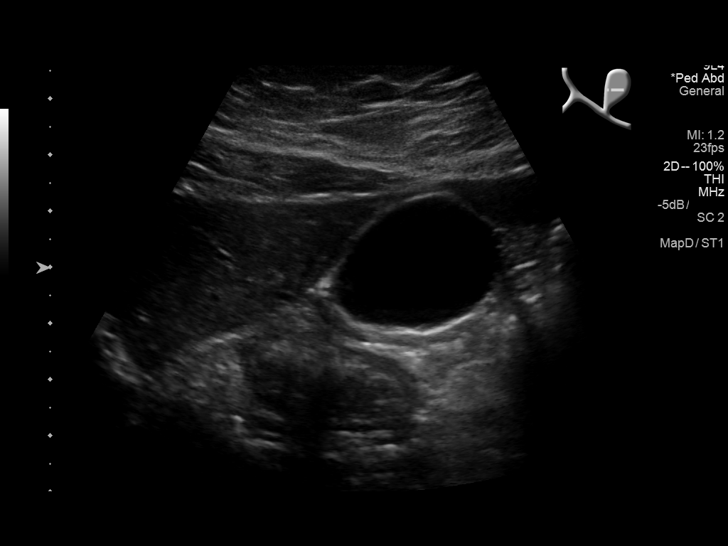
[im 31/122]
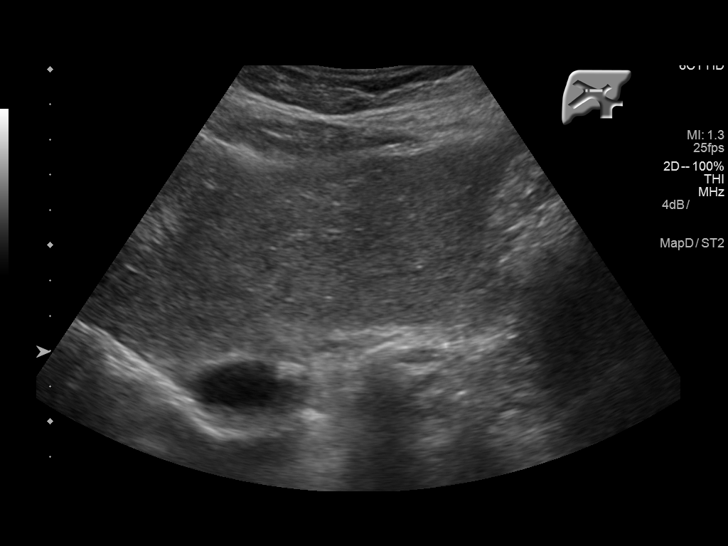
[im 41/122]
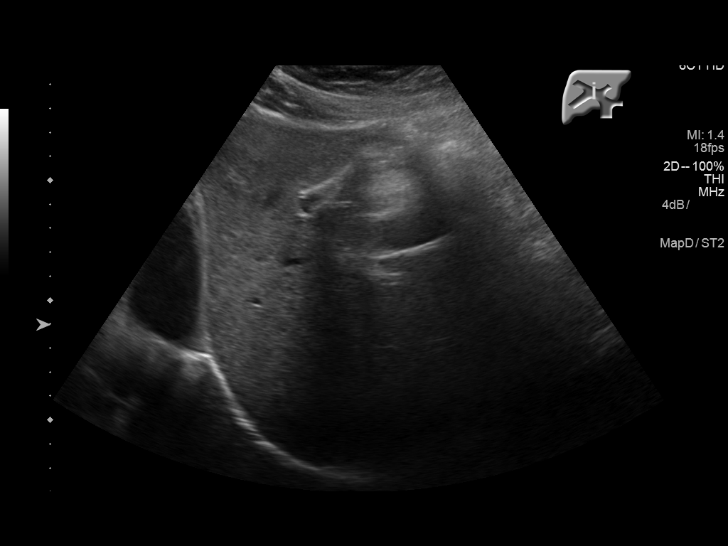
[im 46/122]
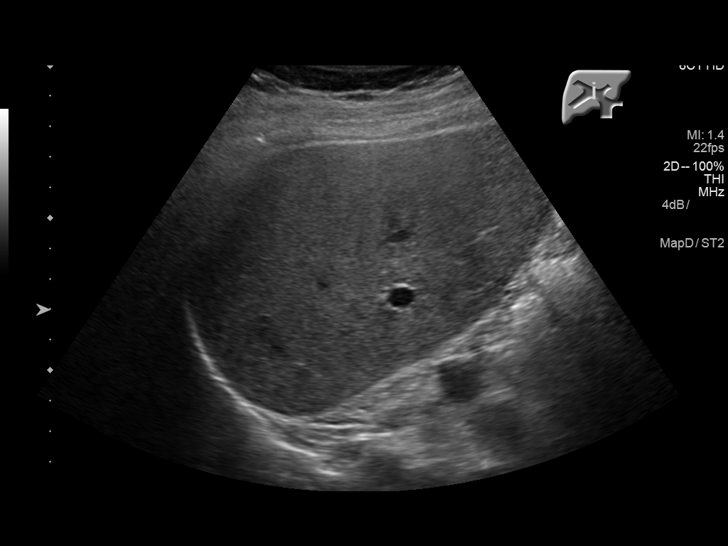
[im 56/122]
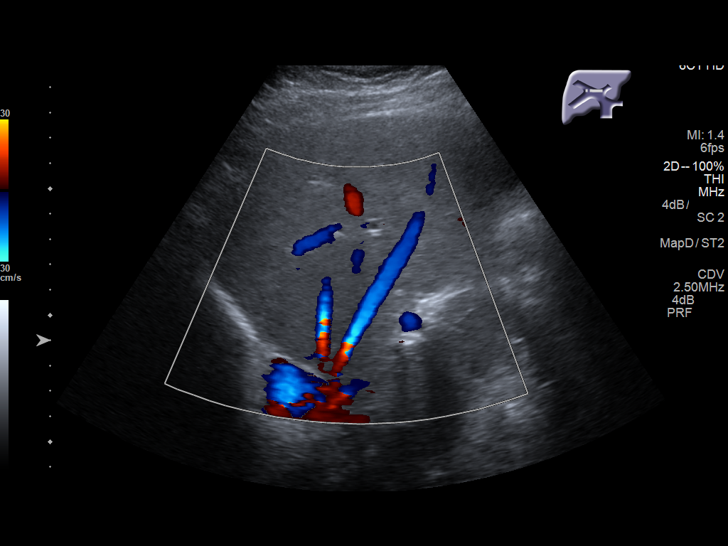
[im 66/122]
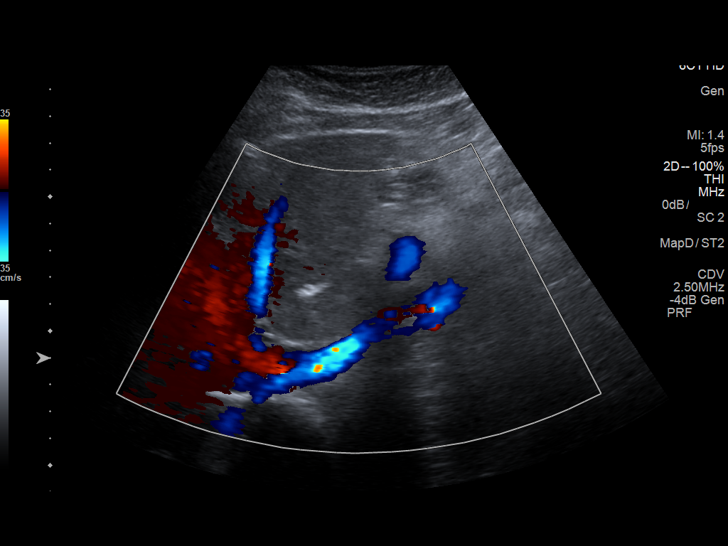
[im 76/122]
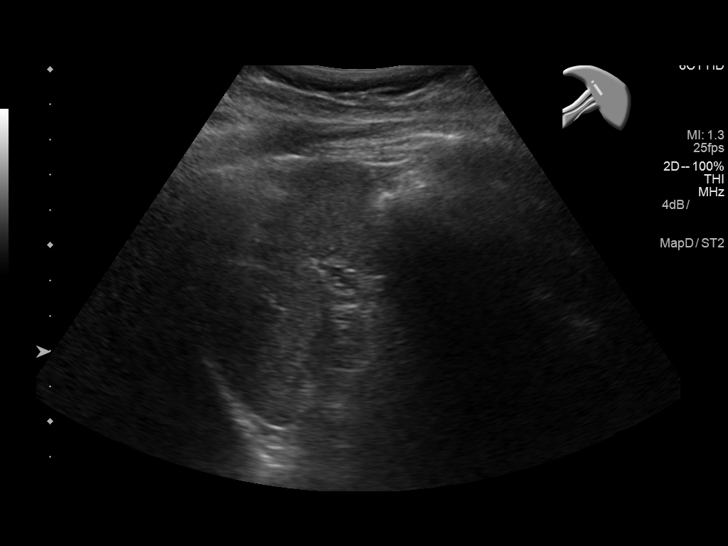
[im 81/122]
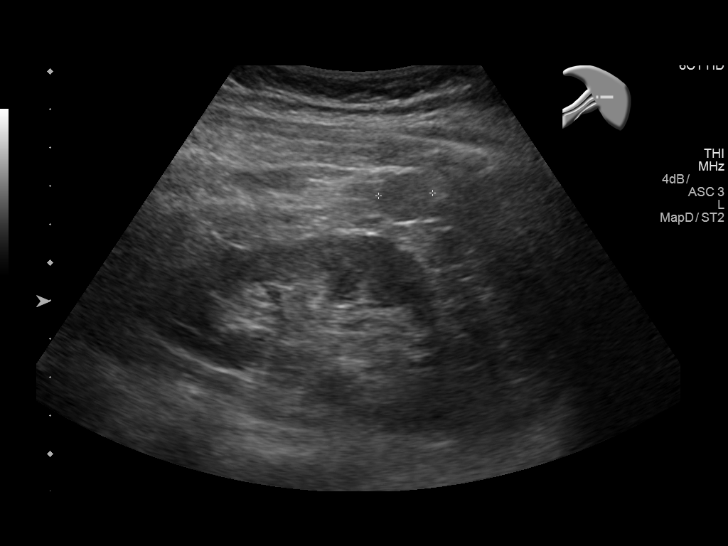
[im 91/122]
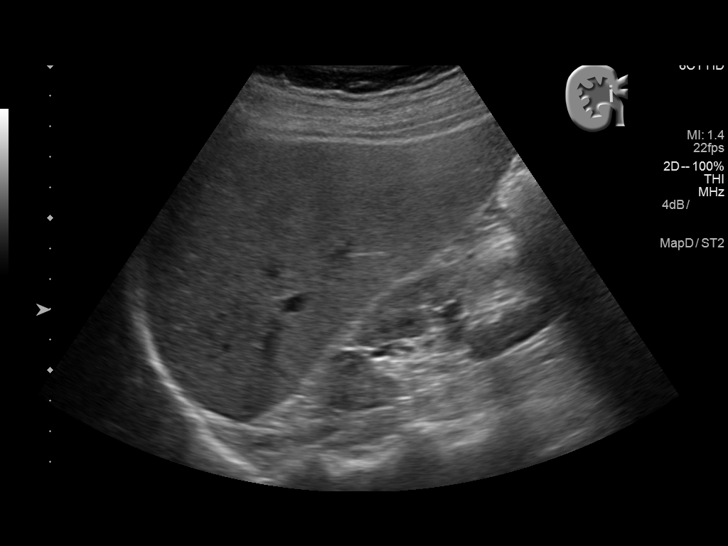
[im 101/122]
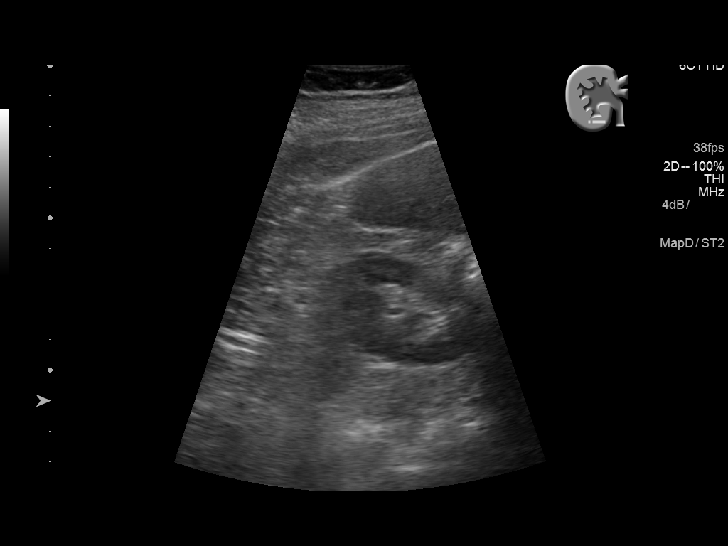
[im 111/122]
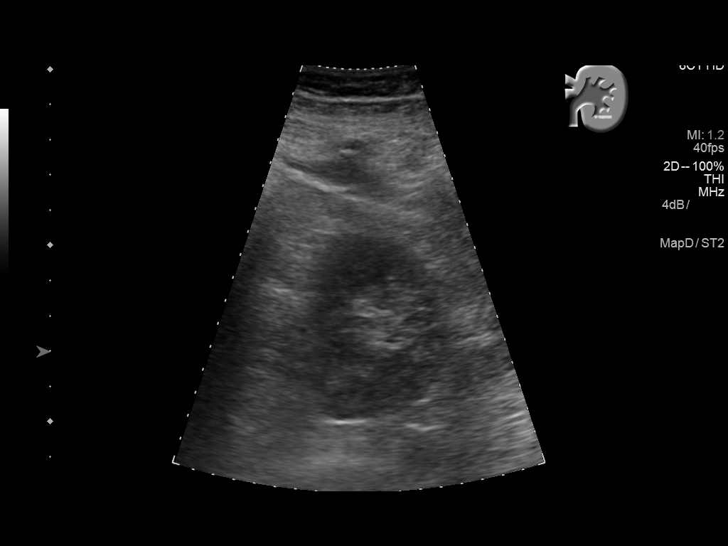
[im 122/122]
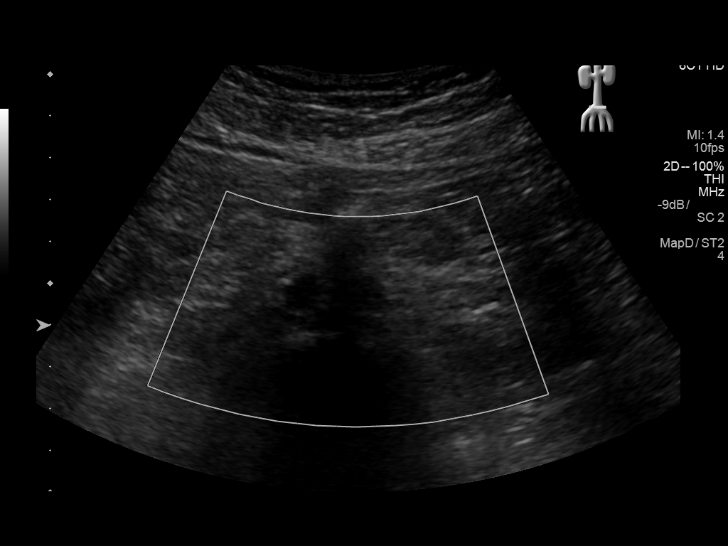

[14 of 25 positions shown; findings below may reference images not displayed]

FINDINGS: Gallbladder: No gallstones or wall thickening visualized. No
sonographic Murphy sign noted by sonographer.

Common bile duct: Diameter: Normal caliber of 4 mm.

Liver: No focal lesion identified. Within normal limits in
parenchymal echogenicity.

IVC: No abnormality visualized.

Pancreas: Visualized portion unremarkable.

Spleen: Size and appearance within normal limits. Small rounded
accessory spleen inferior to the spleen and lateral to the left
kidney measures approximately 1.4 cm in diameter and is stable in
appearance compared to the prior CT scan.

Right Kidney: Length: 10.6 cm. Echogenicity within normal limits. No
mass or hydronephrosis visualized.

Left Kidney: Length: 10.1 cm. Echogenicity within normal limits. No
mass or hydronephrosis visualized.

Abdominal aorta: No aneurysm visualized.

Other findings: None.
IMPRESSION: Normal abdominal ultrasound.

## 2018-02-27 MED ORDER — LOSARTAN POTASSIUM-HCTZ 50-12.5 MG PO TABS: 1.0000 | ORAL_TABLET | Freq: Every day | ORAL | 1 refills | Status: DC

## 2018-05-17 DIAGNOSIS — D2262 Melanocytic nevi of left upper limb, including shoulder: Secondary | ICD-10-CM | POA: Diagnosis not present

## 2018-05-17 DIAGNOSIS — L820 Inflamed seborrheic keratosis: Secondary | ICD-10-CM | POA: Diagnosis not present

## 2018-05-17 DIAGNOSIS — R58 Hemorrhage, not elsewhere classified: Secondary | ICD-10-CM | POA: Diagnosis not present

## 2018-05-17 DIAGNOSIS — D2261 Melanocytic nevi of right upper limb, including shoulder: Secondary | ICD-10-CM | POA: Diagnosis not present

## 2018-05-17 DIAGNOSIS — X32XXXA Exposure to sunlight, initial encounter: Secondary | ICD-10-CM | POA: Diagnosis not present

## 2018-05-17 DIAGNOSIS — D225 Melanocytic nevi of trunk: Secondary | ICD-10-CM | POA: Diagnosis not present

## 2018-05-17 DIAGNOSIS — L570 Actinic keratosis: Secondary | ICD-10-CM | POA: Diagnosis not present

## 2018-05-17 DIAGNOSIS — L821 Other seborrheic keratosis: Secondary | ICD-10-CM | POA: Diagnosis not present

## 2018-05-30 ENCOUNTER — Ambulatory Visit: Payer: Medicare HMO | Admitting: General Surgery

## 2018-06-04 ENCOUNTER — Other Ambulatory Visit: Payer: Self-pay

## 2018-06-04 MED ORDER — LOSARTAN POTASSIUM-HCTZ 50-12.5 MG PO TABS: 1.0000 | ORAL_TABLET | Freq: Every day | ORAL | 1 refills | Status: DC

## 2018-06-13 ENCOUNTER — Ambulatory Visit (INDEPENDENT_AMBULATORY_CARE_PROVIDER_SITE_OTHER): Payer: Medicare HMO | Admitting: General Surgery

## 2018-06-13 ENCOUNTER — Encounter: Payer: Self-pay | Admitting: General Surgery

## 2018-06-13 ENCOUNTER — Ambulatory Visit: Payer: Self-pay

## 2018-06-13 VITALS — BP 154/70 | HR 96 | Resp 14 | Ht 61.5 in | Wt 160.0 lb

## 2018-06-13 DIAGNOSIS — N6323 Unspecified lump in the left breast, lower outer quadrant: Secondary | ICD-10-CM

## 2018-06-13 NOTE — Patient Instructions (Addendum)
The patient is aware to call back for any questions or new concerns.  

## 2018-06-13 NOTE — Progress Notes (Signed)
Patient ID: Michele Fernandez, female   DOB: 17-Mar-1946, 72 y.o.   MRN: [id number]  Chief Complaint  Patient presents with  . Follow-up    HPI Michele Fernandez is a 72 y.o. female. Here for evaluation of a new left breast lump she found about 2-3 weeks ago. She states she woke up at night with left breast pain because she was lying on her night gown and the seam was pressing her left breast  She can intermittently feel a small pea size nodule near the edge of the areola.  HPI  Past Medical History:  Diagnosis Date  . Cancer ([place])    skin  . GERD (gastroesophageal reflux disease)   . Hypertension   . Other sign and symptom in breast     Past Surgical History:  Procedure Laterality Date  . BREAST BIOPSY Bilateral 1993  . BREAST BIOPSY Left 1989  . CATARACT EXTRACTION W/PHACO Left 09/13/2015   Procedure: CATARACT EXTRACTION PHACO AND INTRAOCULAR LENS PLACEMENT (IOC);  Surgeon: Estill Cotta, MD;  Location: [facility] ORS;  Service: Ophthalmology;  Laterality: Left;  IAX#[id number] H US:01:22.4 AP%:26.1 CDE:37.12  . CEREBRAL ANEURYSM REPAIR  1991  . COLONOSCOPY  2014   [facility] , benign colonic mucosa.  Marland Kitchen EYE SURGERY Right 11/2014  . FOOT SURGERY  2009, 2012  . [place]  2006  . UPPER GI ENDOSCOPY  2006    Family History  Problem Relation Age of Onset  . Alcohol abuse Son   . Cancer Maternal Aunt        ovarian  . Heart attack Maternal Grandmother   . Cancer Brother        lung ca,   sepsis pneumonia    Social History Social History   Tobacco Use  . Smoking status: Former Smoker    Packs/day: 1.00    Years: 15.00    Pack years: 15.00    Types: Cigarettes    Last attempt to quit: 12/10/1972    Years since quitting: 45.5  . Smokeless tobacco: Never Used  Substance Use Topics  . Alcohol use: Yes    Comment: rare  . Drug use: No    Allergies  Allergen Reactions  . Biaxin [Clarithromycin] Nausea Only    Metallic taste  . Demerol [Meperidine]  Nausea And Vomiting    IV Demerol Only  . 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    Current Outpatient Medications  Medication Sig Dispense Refill  . aspirin EC 81 MG tablet Take 81 mg by mouth every other day.    . butalbital-acetaminophen-caffeine (FIORICET, ESGIC) 50-325-40 MG tablet Take 1 tablet by mouth 2 (two) times daily as needed for headache or migraine. 30 tablet 1  . Cholecalciferol (VITAMIN D3) 2000 units TABS Take 1 tablet by mouth daily.    . cyclobenzaprine (FLEXERIL) 10 MG tablet Take 1 tablet (10 mg total) by mouth 3 (three) times daily as needed for muscle spasms. 60 tablet 2  . hydrochlorothiazide (HYDRODIURIL) 25 MG tablet Take 1 tablet (25 mg total) by mouth daily. 30 tablet 0  . losartan-hydrochlorothiazide (HYZAAR) 50-12.5 MG tablet Take 1 tablet by mouth daily. 90 tablet 1  . Magnesium Oxide 200 MG TABS Take 200 mg by mouth every other day.    . Probiotic Product (ALIGN) 4 MG CAPS Take 1 capsule by mouth daily.    . simvastatin (ZOCOR) 20 MG tablet TAKE 1 TABLET BY MOUTH AT BEDTIME 90  tablet 1   No current facility-administered medications for this visit.     Review of Systems Review of Systems  Constitutional: Negative.   Respiratory: Negative.   Cardiovascular: Negative.     Blood pressure (!) 154/70, pulse 96, resp. rate 14, height 5' 1.5" (1.562 m), weight 160 lb (72.6 kg), SpO2 98 %.  Physical Exam Physical Exam  Constitutional: She is oriented to person, place, and time. She appears well-developed and well-nourished.  HENT:  Mouth/Throat: Oropharynx is clear and moist.  Eyes: Conjunctivae are normal. No scleral icterus.  Neck: Neck supple.  Cardiovascular: Normal rate, regular rhythm and normal heart sounds.  Pulmonary/Chest: Effort normal and breath sounds normal. Right breast exhibits no inverted nipple, no mass, no nipple discharge, no skin change and no tenderness. Left breast exhibits mass. Left breast  exhibits no inverted nipple, no nipple discharge, no skin change and no tenderness.    Lymphadenopathy:    She has no cervical adenopathy.    She has no axillary adenopathy.  Neurological: She is alert and oriented to person, place, and time.  Skin: Skin is warm and dry.  Psychiatric: Her behavior is normal.    Data Reviewed October 2018 screening mammograms completed at [facility] were reviewed.  No area of concern in the retroareolar area of the left breast.  Ultrasound examination of the left breast was completed in the area of the patient's clinical concern.  No cystic or solid lesions, architectural distortion or nodularity is noted.  BI-RADS-1  Assessment    Benign breast exam, prominent subcutaneous fatty tissue.    Plan    Follow up as scheduled in October with screening mammogram.  The patient has been asked to avoid manipulating the area between now and her October follow-up.      HPI, Physical Exam, Assessment and Plan have been scribed under the direction and in the presence of Michele Bellow, MD. Michele Fetch, RN  I have completed the exam and reviewed the above documentation for accuracy and completeness.  I agree with the above.  Michele Fernandez has been used and any errors in dictation or transcription are unintentional.  Michele Fernandez, M.D., F.A.C.S.  Forest Gleason Carlean Crowl 06/14/2018, 5:58 AM

## 2018-07-03 ENCOUNTER — Encounter: Payer: Self-pay | Admitting: Internal Medicine

## 2018-07-03 ENCOUNTER — Other Ambulatory Visit: Payer: Self-pay | Admitting: Internal Medicine

## 2018-07-03 DIAGNOSIS — H9319 Tinnitus, unspecified ear: Secondary | ICD-10-CM | POA: Insufficient documentation

## 2018-07-03 NOTE — Progress Notes (Signed)
amb ref to  

## 2018-07-18 DIAGNOSIS — H9319 Tinnitus, unspecified ear: Secondary | ICD-10-CM | POA: Diagnosis not present

## 2018-07-18 DIAGNOSIS — H6121 Impacted cerumen, right ear: Secondary | ICD-10-CM | POA: Diagnosis not present

## 2018-07-18 DIAGNOSIS — H9311 Tinnitus, right ear: Secondary | ICD-10-CM | POA: Diagnosis not present

## 2018-07-18 DIAGNOSIS — R42 Dizziness and giddiness: Secondary | ICD-10-CM | POA: Diagnosis not present

## 2018-09-04 DIAGNOSIS — Z1231 Encounter for screening mammogram for malignant neoplasm of breast: Secondary | ICD-10-CM | POA: Diagnosis not present

## 2018-09-05 ENCOUNTER — Encounter: Payer: Self-pay | Admitting: General Surgery

## 2018-09-09 ENCOUNTER — Other Ambulatory Visit: Payer: Self-pay | Admitting: Internal Medicine

## 2018-09-19 ENCOUNTER — Ambulatory Visit: Payer: Medicare HMO | Admitting: General Surgery

## 2018-09-24 ENCOUNTER — Ambulatory Visit: Payer: Medicare HMO | Admitting: General Surgery

## 2018-09-24 ENCOUNTER — Other Ambulatory Visit: Payer: Self-pay

## 2018-09-24 ENCOUNTER — Encounter: Payer: Self-pay | Admitting: General Surgery

## 2018-09-24 VITALS — BP 149/74 | HR 81 | Temp 95.4°F | Resp 13 | Ht 61.5 in | Wt 158.6 lb

## 2018-09-24 DIAGNOSIS — N6323 Unspecified lump in the left breast, lower outer quadrant: Secondary | ICD-10-CM

## 2018-09-24 DIAGNOSIS — N6311 Unspecified lump in the right breast, upper outer quadrant: Secondary | ICD-10-CM

## 2018-09-24 NOTE — Patient Instructions (Signed)
Patient is to return in 1 year with bilateral mammogram.

## 2018-09-24 NOTE — Progress Notes (Signed)
Patient ID: Michele Fernandez, female   DOB: 10-Feb-1946, 72 y.o.   MRN: [id number]  Chief Complaint  Patient presents with  . Follow-up     one year f/u recall bil screening mammo [facility] 09/04/18    HPI Michele Fernandez is a 72 y.o. female here today to follow up for one year bilateral mammogram screening completed at [facility] on 09/04/18. Son, age 63, recently hospitalized at [facility] with Werner Lean.  HPI  Past Medical History:  Diagnosis Date  . Cancer ([place])    skin  . GERD (gastroesophageal reflux disease)   . Hypertension   . Other sign and symptom in breast     Past Surgical History:  Procedure Laterality Date  . BREAST BIOPSY Bilateral 1993  . BREAST BIOPSY Left 1989  . CATARACT EXTRACTION W/PHACO Left 09/13/2015   Procedure: CATARACT EXTRACTION PHACO AND INTRAOCULAR LENS PLACEMENT (IOC);  Surgeon: Estill Cotta, MD;  Location: [facility] ORS;  Service: Ophthalmology;  Laterality: Left;  XBL#[id number] H US:01:22.4 AP%:26.1 CDE:37.12  . CEREBRAL ANEURYSM REPAIR  1991  . COLONOSCOPY  2014   [facility] [place], benign colonic mucosa.  Marland Kitchen EYE SURGERY Right 11/2014  . FOOT SURGERY  2009, 2012  . [place]  2006  . UPPER GI ENDOSCOPY  2006    Family History  Problem Relation Age of Onset  . Alcohol abuse Son   . Cancer Maternal Aunt        ovarian  . Heart attack Maternal Grandmother   . Cancer Brother        lung ca,   sepsis pneumonia    Social History Social History   Tobacco Use  . Smoking status: Former Smoker    Packs/day: 1.00    Years: 15.00    Pack years: 15.00    Types: Cigarettes    Last attempt to quit: 12/10/1972    Years since quitting: 45.8  . Smokeless tobacco: Never Used  Substance Use Topics  . Alcohol use: Yes    Comment: rare  . Drug use: No    Allergies  Allergen Reactions  . Biaxin [Clarithromycin] Nausea Only    Metallic taste  . Demerol [Meperidine] Nausea And Vomiting    IV Demerol Only  . 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    Current Outpatient Medications  Medication Sig Dispense Refill  . aspirin EC 81 MG tablet Take 81 mg by mouth every other day.    . butalbital-acetaminophen-caffeine (FIORICET, ESGIC) 50-325-40 MG tablet Take by mouth.    . Cholecalciferol (VITAMIN D3) 2000 units TABS Take 1 tablet by mouth daily.    . cyclobenzaprine (FLEXERIL) 10 MG tablet Take 1 tablet (10 mg total) by mouth 3 (three) times daily as needed for muscle spasms. 60 tablet 2  . hydrochlorothiazide (HYDRODIURIL) 25 MG tablet Take 1 tablet (25 mg total) by mouth daily. 30 tablet 0  . losartan-hydrochlorothiazide (HYZAAR) 50-12.5 MG tablet Take 1 tablet by mouth daily. 90 tablet 1  . Magnesium Oxide 200 MG TABS Take 200 mg by mouth every other day.    . Probiotic Product (ALIGN) 4 MG CAPS Take 1 capsule by mouth daily.    . simvastatin (ZOCOR) 20 MG tablet TAKE 1 TABLET BY MOUTH AT BEDTIME 90 tablet 1   No current facility-administered medications for this visit.     Review of Systems Review of Systems  Constitutional: Negative.   Respiratory: Negative.   Cardiovascular: Negative.  Blood pressure (!) 149/74, pulse 81, temperature (!) 95.4 F (35.2 C), temperature source Temporal, resp. rate 13, height 5' 1.5" (1.562 m), weight 158 lb 9.6 oz (71.9 kg), SpO2 96 %.  Physical Exam Physical Exam  Constitutional: She is oriented to person, place, and time. She appears well-developed and well-nourished.  Eyes: Conjunctivae are normal. No scleral icterus.  Neck: Normal range of motion.  Cardiovascular: Normal rate, regular rhythm and normal heart sounds.  Pulmonary/Chest: Effort normal and breath sounds normal. Right breast exhibits no inverted nipple, no mass, no nipple discharge, no skin change and no tenderness. Left breast exhibits no inverted nipple, no mass, no nipple discharge, no skin change and no tenderness. Breasts are symmetrical.    Lymphadenopathy:     She has no cervical adenopathy.  Neurological: She is alert and oriented to person, place, and time.  Skin: Skin is warm and dry.    Data Reviewed September 04, 2018 bilateral mammograms reviewed. No interval change. BIRAD 2.   Assessment    Unremarkable breast exam.     Plan Reviewed GYN screening guideline re: Pap smear, etc.   Patient is to return in 1 year with bilateral screening. mammogram. (Patient request).  HPI, Physical Exam, Assessment and Plan have been scribed under the direction and in the presence of Hervey Ard, Md.  Michele Fernandez, CMA  I have completed the exam and reviewed the above documentation for accuracy and completeness.  I agree with the above.  Haematologist has been used and any errors in dictation or transcription are unintentional.  Hervey Ard, M.D., F.A.C.S.  Michele Fernandez 09/24/2018, 8:55 PM

## 2018-10-03 DIAGNOSIS — R69 Illness, unspecified: Secondary | ICD-10-CM | POA: Diagnosis not present

## 2018-10-28 ENCOUNTER — Other Ambulatory Visit: Payer: Self-pay | Admitting: Internal Medicine

## 2018-11-01 ENCOUNTER — Encounter: Payer: Medicare HMO | Admitting: Internal Medicine

## 2018-11-04 ENCOUNTER — Encounter: Payer: Self-pay | Admitting: Internal Medicine

## 2018-11-04 ENCOUNTER — Ambulatory Visit (INDEPENDENT_AMBULATORY_CARE_PROVIDER_SITE_OTHER): Payer: Medicare HMO | Admitting: Internal Medicine

## 2018-11-04 VITALS — BP 124/70 | HR 75 | Temp 97.8°F | Resp 14 | Ht 61.5 in | Wt 161.6 lb

## 2018-11-04 DIAGNOSIS — Z79899 Other long term (current) drug therapy: Secondary | ICD-10-CM | POA: Diagnosis not present

## 2018-11-04 DIAGNOSIS — Z0000 Encounter for general adult medical examination without abnormal findings: Secondary | ICD-10-CM | POA: Diagnosis not present

## 2018-11-04 DIAGNOSIS — E7800 Pure hypercholesterolemia, unspecified: Secondary | ICD-10-CM | POA: Diagnosis not present

## 2018-11-04 DIAGNOSIS — M810 Age-related osteoporosis without current pathological fracture: Secondary | ICD-10-CM

## 2018-11-04 LAB — COMPREHENSIVE METABOLIC PANEL
ALK PHOS: 56 U/L (ref 39–117)
ALT: 28 U/L (ref 0–35)
AST: 24 U/L (ref 0–37)
Albumin: 4.2 g/dL (ref 3.5–5.2)
BILIRUBIN TOTAL: 0.5 mg/dL (ref 0.2–1.2)
BUN: 15 mg/dL (ref 6–23)
CALCIUM: 9.5 mg/dL (ref 8.4–10.5)
CO2: 27 mEq/L (ref 19–32)
CREATININE: 0.91 mg/dL (ref 0.40–1.20)
Chloride: 106 mEq/L (ref 96–112)
GFR: 64.94 mL/min (ref >60.00)
Glucose, Bld: 97 mg/dL (ref 70–99)
Potassium: 4.4 mEq/L (ref 3.5–5.1)
Sodium: 140 mEq/L (ref 135–145)
TOTAL PROTEIN: 6.8 g/dL (ref 6.0–8.3)

## 2018-11-04 NOTE — Progress Notes (Signed)
Patient ID: Michele Fernandez, female    DOB: 01-Feb-1946  Age: 72 y.o. MRN: [id number]  The patient is here for annual preventive exam ination and management of other chronic and acute problems. Annual dermatology exam with Dr Kellie Moor in June [place] for breasts  Colon due in 2024 Reduce aa use to  3/week du to purpura On statin due to atherosclerosis of aorta     The risk factors are reflected in the social history.  The roster of all physicians providing medical care to patient - is listed in the Snapshot section of the chart.  Activities of daily living:  The patient is 100% independent in all ADLs: dressing, toileting, feeding as well as independent mobility  Home safety : The patient has smoke detectors in the home. They wear seatbelts.  There are no firearms at home. There is no violence in the home.   There is no risks for hepatitis, STDs or HIV. There is no   history of blood transfusion. They have no travel history to infectious disease endemic areas of the world.  The patient has seen their dentist in the last six month. They have seen their eye doctor in the last year. They admit to slight hearing difficulty with regard to whispered voices and some television programs.  They have deferred audiologic testing in the last year.  They do not  have excessive sun exposure. Discussed the need for sun protection: hats, long sleeves and use of sunscreen if there is significant sun exposure.   Diet: the importance of a healthy diet is discussed. They do have a healthy diet.  The benefits of regular aerobic exercise were discussed. She walks 4 times per week ,  20 minutes.   Depression screen: there are no signs or vegative symptoms of depression- irritability, change in appetite, anhedonia, sadness/tearfullness.  Cognitive assessment: the patient manages all their financial and personal affairs and is actively engaged. They could relate day,date,year and events; recalled 2/3 objects  at 3 minutes; performed clock-face test normally.  The following portions of the patient's history were reviewed and updated as appropriate: allergies, current medications, past family history, past medical history,  past surgical history, past social history  and problem list.  Visual acuity was not assessed per patient preference since she has regular follow up with her ophthalmologist. Hearing and body mass index were assessed and reviewed.   During the course of the visit the patient was educated and counseled about appropriate screening and preventive services including : fall prevention , diabetes screening, nutrition counseling, colorectal cancer screening, and recommended immunizations.    CC: The primary encounter diagnosis was Long-term use of high-risk medication. Diagnoses of Age-related osteoporosis without current pathological fracture, Encounter for preventive health examination, and Pure hypercholesterolemia were also pertinent to this visit.  Anxiety:  son recently contracted Guillaiin barrre syndrome after a viral URI after travelling to [place] .  He was finally released from  rehab  last week  husband had an artificial urinary sphincter (button in scrotum) at [facility] to manage post robotic prostatectomy    Osteoporosis previously managed with  bisphosphonates and soy products  A long time ago . Previously tried Evista but had leg pain   TINNITUS, leg swelling saw [place] has a past medical history of Cancer ([place]), GERD (gastroesophageal reflux disease), Hypertension, and Other sign and symptom in breast.   She has a past surgical history that includes Breast biopsy (Bilateral, 1993); Breast biopsy (Left, 1989);  Upper gi endoscopy (2006); Hammer toe surgery (2006); Foot surgery (2009, 2012); Cerebral aneurysm repair (1991); Colonoscopy (2014); Eye surgery (Right, 11/2014); and Cataract extraction w/PHACO (Left, 09/13/2015).   Her family history includes  Alcohol abuse in her son; Cancer in her brother and maternal aunt; Heart attack in her maternal grandmother.She reports that she quit smoking about 45 years ago. Her smoking use included cigarettes. She has a 15.00 pack-year smoking history. She has never used smokeless tobacco. She reports that she drinks alcohol. She reports that she does not use drugs.  Outpatient Medications Prior to Visit  Medication Sig Dispense Refill  . aspirin EC 81 MG tablet Take 81 mg by mouth every other day.    . Cholecalciferol (VITAMIN D3) 2000 units TABS Take 1 tablet by mouth daily.    . cyclobenzaprine (FLEXERIL) 10 MG tablet Take 1 tablet (10 mg total) by mouth 3 (three) times daily as needed for muscle spasms. 60 tablet 2  . hydrochlorothiazide (HYDRODIURIL) 25 MG tablet Take 1 tablet (25 mg total) by mouth daily. 30 tablet 0  . losartan-hydrochlorothiazide (HYZAAR) 50-12.5 MG tablet Take 1 tablet by mouth daily. 90 tablet 1  . Magnesium Oxide 200 MG TABS Take 200 mg by mouth every other day.    . Probiotic Product (ALIGN) 4 MG CAPS Take 1 capsule by mouth daily.    . simvastatin (ZOCOR) 20 MG tablet TAKE ONE TABLET BY MOUTH EVERY DAY AT BEDTIME 90 tablet 1  . butalbital-acetaminophen-caffeine (FIORICET, ESGIC) 50-325-40 MG tablet Take by mouth.     No facility-administered medications prior to visit.     Review of Systems   Patient denies headache, fevers, malaise, unintentional weight loss, skin rash, eye pain, sinus congestion and sinus pain, sore throat, dysphagia,  hemoptysis , cough, dyspnea, wheezing, chest pain, palpitations, orthopnea, edema, abdominal pain, nausea, melena, diarrhea, constipation, flank pain, dysuria, hematuria, urinary  Frequency, nocturia, numbness, tingling, seizures,  Focal weakness, Loss of consciousness,  Tremor, insomnia, depression, anxiety, and suicidal ideation.      Objective:  BP 124/70 (BP Location: Left Arm, Patient Position: Sitting, Cuff Size: Large)   Pulse 75    Temp 97.8 F (36.6 C) (Oral)   Resp 14   Ht 5' 1.5" (1.562 m)   Wt 161 lb 9.6 oz (73.3 kg)   SpO2 96%   BMI 30.04 kg/m   Physical Exam   General appearance: alert, cooperative and appears stated age Ears: normal TM's and external ear canals both ears Throat: lips, mucosa, and tongue normal; teeth and gums normal Neck: no adenopathy, no carotid bruit, supple, symmetrical, trachea midline and thyroid not enlarged, symmetric, no tenderness/mass/nodules Back: symmetric, no curvature. ROM normal. No CVA tenderness. Lungs: clear to auscultation bilaterally Heart: regular rate and rhythm, S1, S2 normal, no murmur, click, rub or gallop Abdomen: soft, non-tender; bowel sounds normal; no masses,  no organomegaly Pulses: 2+ and symmetric Skin: Skin color, texture, turgor normal. No rashes or lesions Lymph nodes: Cervical, supraclavicular, and axillary nodes normal.    Assessment & Plan:   Problem List Items Addressed This Visit    Encounter for preventive health examination    age appropriate education and counseling updated, referrals for preventative services and immunizations addressed, dietary and smoking counseling addressed, most recent labs reviewed.  I have personally reviewed and have noted:  1) the patient's medical and social history 2) The pt's use of alcohol, tobacco, and illicit drugs 3) The patient's current medications and supplements 4) Functional ability including  ADL's, fall risk, home safety risk, hearing and visual impairment 5) Diet and physical activities 6) Evidence for depression or mood disorder 7) The patient's height, weight, and BMI have been recorded in the chart  I have made referrals, and provided counseling and education based on review of the above      Hyperlipidemia    LDL is at goal on simvastatin .LFTs are normal,  No changes today  Lab Results  Component Value Date   CHOL 155 02/19/2018   HDL 52.70 02/19/2018   LDLCALC 83 02/19/2018    LDLDIRECT 139.0 02/01/2016   TRIG 94.0 02/19/2018   CHOLHDL 3 02/19/2018   Lab Results  Component Value Date   ALT 28 11/04/2018   AST 24 11/04/2018   ALKPHOS 56 11/04/2018   BILITOT 0.5 11/04/2018         Osteoporosis    She is due for repeat DEXA after finishing b/p therapy for over 5 year       Relevant Orders   DG Bone Density    Other Visit Diagnoses    Long-term use of high-risk medication    -  Primary   Relevant Orders   Comprehensive metabolic panel (Completed)      I have discontinued Garyn K. Pfiffner "Connie"'s butalbital-acetaminophen-caffeine. I am also having her maintain her Vitamin D3, aspirin EC, Magnesium Oxide, ALIGN, hydrochlorothiazide, cyclobenzaprine, losartan-hydrochlorothiazide, and simvastatin.  No orders of the defined types were placed in this encounter.   Medications Discontinued During This Encounter  Medication Reason  . butalbital-acetaminophen-caffeine (FIORICET, ESGIC) 50-325-40 MG tablet Patient Preference    Follow-up: No follow-ups on file.   Crecencio Mc, MD

## 2018-11-04 NOTE — Patient Instructions (Signed)
Health Maintenance for Postmenopausal Women Menopause is a normal process in which your reproductive ability comes to an end. This process happens gradually over a span of months to years, usually between the ages of 22 and 9. Menopause is complete when you have missed 12 consecutive menstrual periods. It is important to talk with your health care provider about some of the most common conditions that affect postmenopausal women, such as heart disease, cancer, and bone loss (osteoporosis). Adopting a healthy lifestyle and getting preventive care can help to promote your health and wellness. Those actions can also lower your chances of developing some of these common conditions. What should I know about menopause? During menopause, you may experience a number of symptoms, such as:  Moderate-to-severe hot flashes.  Night sweats.  Decrease in sex drive.  Mood swings.  Headaches.  Tiredness.  Irritability.  Memory problems.  Insomnia.  Choosing to treat or not to treat menopausal changes is an individual decision that you make with your health care provider. What should I know about hormone replacement therapy and supplements? Hormone therapy products are effective for treating symptoms that are associated with menopause, such as hot flashes and night sweats. Hormone replacement carries certain risks, especially as you become older. If you are thinking about using estrogen or estrogen with progestin treatments, discuss the benefits and risks with your health care provider. What should I know about heart disease and stroke? Heart disease, heart attack, and stroke become more likely as you age. This may be due, in part, to the hormonal changes that your body experiences during menopause. These can affect how your body processes dietary fats, triglycerides, and cholesterol. Heart attack and stroke are both medical emergencies. There are many things that you can do to help prevent heart disease  and stroke:  Have your blood pressure checked at least every 1-2 years. High blood pressure causes heart disease and increases the risk of stroke.  If you are 53-22 years old, ask your health care provider if you should take aspirin to prevent a heart attack or a stroke.  Do not use any tobacco products, including cigarettes, chewing tobacco, or electronic cigarettes. If you need help quitting, ask your health care provider.  It is important to eat a healthy diet and maintain a healthy weight. ? Be sure to include plenty of vegetables, fruits, low-fat dairy products, and lean protein. ? Avoid eating foods that are high in solid fats, added sugars, or salt (sodium).  Get regular exercise. This is one of the most important things that you can do for your health. ? Try to exercise for at least 150 minutes each week. The type of exercise that you do should increase your heart rate and make you sweat. This is known as moderate-intensity exercise. ? Try to do strengthening exercises at least twice each week. Do these in addition to the moderate-intensity exercise.  Know your numbers.Ask your health care provider to check your cholesterol and your blood glucose. Continue to have your blood tested as directed by your health care provider.  What should I know about cancer screening? There are several types of cancer. Take the following steps to reduce your risk and to catch any cancer development as early as possible. Breast Cancer  Practice breast self-awareness. ? This means understanding how your breasts normally appear and feel. ? It also means doing regular breast self-exams. Let your health care provider know about any changes, no matter how small.  If you are 40  or older, have a clinician do a breast exam (clinical breast exam or CBE) every year. Depending on your age, family history, and medical history, it may be recommended that you also have a yearly breast X-ray (mammogram).  If you  have a family history of breast cancer, talk with your health care provider about genetic screening.  If you are at high risk for breast cancer, talk with your health care provider about having an MRI and a mammogram every year.  Breast cancer (BRCA) gene test is recommended for women who have family members with BRCA-related cancers. Results of the assessment will determine the need for genetic counseling and BRCA1 and for BRCA2 testing. BRCA-related cancers include these types: ? Breast. This occurs in males or females. ? Ovarian. ? Tubal. This may also be called fallopian tube cancer. ? Cancer of the abdominal or pelvic lining (peritoneal cancer). ? Prostate. ? Pancreatic.  Cervical, Uterine, and Ovarian Cancer Your health care provider may recommend that you be screened regularly for cancer of the pelvic organs. These include your ovaries, uterus, and vagina. This screening involves a pelvic exam, which includes checking for microscopic changes to the surface of your cervix (Pap test).  For women ages 21-65, health care providers may recommend a pelvic exam and a Pap test every three years. For women ages 79-65, they may recommend the Pap test and pelvic exam, combined with testing for human papilloma virus (HPV), every five years. Some types of HPV increase your risk of cervical cancer. Testing for HPV may also be done on women of any age who have unclear Pap test results.  Other health care providers may not recommend any screening for nonpregnant women who are considered low risk for pelvic cancer and have no symptoms. Ask your health care provider if a screening pelvic exam is right for you.  If you have had past treatment for cervical cancer or a condition that could lead to cancer, you need Pap tests and screening for cancer for at least 20 years after your treatment. If Pap tests have been discontinued for you, your risk factors (such as having a new sexual partner) need to be  reassessed to determine if you should start having screenings again. Some women have medical problems that increase the chance of getting cervical cancer. In these cases, your health care provider may recommend that you have screening and Pap tests more often.  If you have a family history of uterine cancer or ovarian cancer, talk with your health care provider about genetic screening.  If you have vaginal bleeding after reaching menopause, tell your health care provider.  There are currently no reliable tests available to screen for ovarian cancer.  Lung Cancer Lung cancer screening is recommended for adults 69-62 years old who are at high risk for lung cancer because of a history of smoking. A yearly low-dose CT scan of the lungs is recommended if you:  Currently smoke.  Have a history of at least 30 pack-years of smoking and you currently smoke or have quit within the past 15 years. A pack-year is smoking an average of one pack of cigarettes per day for one year.  Yearly screening should:  Continue until it has been 15 years since you quit.  Stop if you develop a health problem that would prevent you from having lung cancer treatment.  Colorectal Cancer  This type of cancer can be detected and can often be prevented.  Routine colorectal cancer screening usually begins at  age 42 and continues through age 45.  If you have risk factors for colon cancer, your health care provider may recommend that you be screened at an earlier age.  If you have a family history of colorectal cancer, talk with your health care provider about genetic screening.  Your health care provider may also recommend using home test kits to check for hidden blood in your stool.  A small camera at the end of a tube can be used to examine your colon directly (sigmoidoscopy or colonoscopy). This is done to check for the earliest forms of colorectal cancer.  Direct examination of the colon should be repeated every  5-10 years until age 71. However, if early forms of precancerous polyps or small growths are found or if you have a family history or genetic risk for colorectal cancer, you may need to be screened more often.  Skin Cancer  Check your skin from head to toe regularly.  Monitor any moles. Be sure to tell your health care provider: ? About any new moles or changes in moles, especially if there is a change in a mole's shape or color. ? If you have a mole that is larger than the size of a pencil eraser.  If any of your family members has a history of skin cancer, especially at a young age, talk with your health care provider about genetic screening.  Always use sunscreen. Apply sunscreen liberally and repeatedly throughout the day.  Whenever you are outside, protect yourself by wearing long sleeves, pants, a wide-brimmed hat, and sunglasses.  What should I know about osteoporosis? Osteoporosis is a condition in which bone destruction happens more quickly than new bone creation. After menopause, you may be at an increased risk for osteoporosis. To help prevent osteoporosis or the bone fractures that can happen because of osteoporosis, the following is recommended:  If you are 46-71 years old, get at least 1,000 mg of calcium and at least 600 mg of vitamin D per day.  If you are older than age 55 but younger than age 65, get at least 1,200 mg of calcium and at least 600 mg of vitamin D per day.  If you are older than age 54, get at least 1,200 mg of calcium and at least 800 mg of vitamin D per day.  Smoking and excessive alcohol intake increase the risk of osteoporosis. Eat foods that are rich in calcium and vitamin D, and do weight-bearing exercises several times each week as directed by your health care provider. What should I know about how menopause affects my mental health? Depression may occur at any age, but it is more common as you become older. Common symptoms of depression  include:  Low or sad mood.  Changes in sleep patterns.  Changes in appetite or eating patterns.  Feeling an overall lack of motivation or enjoyment of activities that you previously enjoyed.  Frequent crying spells.  Talk with your health care provider if you think that you are experiencing depression. What should I know about immunizations? It is important that you get and maintain your immunizations. These include:  Tetanus, diphtheria, and pertussis (Tdap) booster vaccine.  Influenza every year before the flu season begins.  Pneumonia vaccine.  Shingles vaccine.  Your health care provider may also recommend other immunizations. This information is not intended to replace advice given to you by your health care provider. Make sure you discuss any questions you have with your health care provider. Document Released: 01/12/2006  Document Revised: 06/09/2016 Document Reviewed: 08/24/2015 Elsevier Interactive Patient Education  2018 Elsevier Inc.  

## 2018-11-05 NOTE — Assessment & Plan Note (Signed)

## 2018-11-05 NOTE — Assessment & Plan Note (Addendum)
LDL is at goal on simvastatin .LFTs are normal,  No changes today  Lab Results  Component Value Date   CHOL 155 02/19/2018   HDL 52.70 02/19/2018   LDLCALC 83 02/19/2018   LDLDIRECT 139.0 02/01/2016   TRIG 94.0 02/19/2018   CHOLHDL 3 02/19/2018   Lab Results  Component Value Date   ALT 28 11/04/2018   AST 24 11/04/2018   ALKPHOS 56 11/04/2018   BILITOT 0.5 11/04/2018

## 2018-11-05 NOTE — Assessment & Plan Note (Signed)
She is due for repeat DEXA after finishing b/p therapy for over 5 year

## 2018-11-14 ENCOUNTER — Telehealth: Payer: Self-pay

## 2018-11-14 DIAGNOSIS — M810 Age-related osteoporosis without current pathological fracture: Secondary | ICD-10-CM

## 2018-11-14 NOTE — Telephone Encounter (Signed)
Copied from [place] [id number]. Topic: General - Other >> Nov 14, 2018  1:22 PM Leward Quan A wrote: Reason for CRM: Patient called to inquire about referral to get a Dexa scan with Rheumatology at [place].  Fax# [phone]. Please send referral. Patient Ph# [phone]

## 2018-11-15 ENCOUNTER — Other Ambulatory Visit: Payer: Self-pay | Admitting: Internal Medicine

## 2018-11-15 MED ORDER — LOSARTAN POTASSIUM 50 MG PO TABS: 50.0000 mg | ORAL_TABLET | Freq: Every day | ORAL | 3 refills | Status: DC

## 2018-11-15 NOTE — Telephone Encounter (Signed)
DEXA scan ordered.  Does she want a referral to rheumatology as well or just a DEWA scan?

## 2018-11-15 NOTE — Telephone Encounter (Signed)
DEXA and rheumatology referral made to [facility] clinic

## 2018-11-15 NOTE — Telephone Encounter (Signed)
Spoke with pt and notified her that the referral had been placed for rheumatology and the dexa scan. Pt gave a verbal understanding.

## 2018-11-15 NOTE — Addendum Note (Signed)
Addended by: Crecencio Mc on: 11/15/2018 04:02 PM   Modules accepted: Orders

## 2018-11-29 NOTE — Telephone Encounter (Signed)
Can you cancel the endocrine referral? She wants to wait.  Still wants the DEXA

## 2018-12-02 ENCOUNTER — Encounter: Payer: Self-pay | Admitting: Internal Medicine

## 2018-12-02 DIAGNOSIS — M810 Age-related osteoporosis without current pathological fracture: Secondary | ICD-10-CM | POA: Diagnosis not present

## 2018-12-09 ENCOUNTER — Telehealth: Payer: Self-pay | Admitting: Internal Medicine

## 2018-12-09 DIAGNOSIS — M810 Age-related osteoporosis without current pathological fracture: Secondary | ICD-10-CM

## 2018-12-09 NOTE — Assessment & Plan Note (Signed)
No change by 2019 DEXA .  Ten year risk of hip fracture is low at 1.4% , low at  9.6% for any major fracture,  No treatment required.

## 2018-12-09 NOTE — Telephone Encounter (Signed)
MyChart message sent re DEXA

## 2018-12-18 DIAGNOSIS — H26492 Other secondary cataract, left eye: Secondary | ICD-10-CM | POA: Diagnosis not present

## 2019-01-01 ENCOUNTER — Ambulatory Visit: Payer: Medicare HMO | Admitting: Internal Medicine

## 2019-02-07 ENCOUNTER — Ambulatory Visit: Payer: Medicare HMO | Admitting: Internal Medicine

## 2019-02-07 ENCOUNTER — Encounter: Payer: Self-pay | Admitting: Internal Medicine

## 2019-02-07 ENCOUNTER — Telehealth: Payer: Self-pay | Admitting: *Deleted

## 2019-02-07 VITALS — BP 148/76 | HR 75 | Temp 97.8°F | Resp 14 | Ht 61.5 in | Wt 161.4 lb

## 2019-02-07 DIAGNOSIS — Z0000 Encounter for general adult medical examination without abnormal findings: Secondary | ICD-10-CM | POA: Diagnosis not present

## 2019-02-07 DIAGNOSIS — M818 Other osteoporosis without current pathological fracture: Secondary | ICD-10-CM | POA: Diagnosis not present

## 2019-02-07 DIAGNOSIS — I10 Essential (primary) hypertension: Secondary | ICD-10-CM | POA: Diagnosis not present

## 2019-02-07 DIAGNOSIS — R42 Dizziness and giddiness: Secondary | ICD-10-CM | POA: Diagnosis not present

## 2019-02-07 DIAGNOSIS — E782 Mixed hyperlipidemia: Secondary | ICD-10-CM | POA: Diagnosis not present

## 2019-02-07 LAB — TSH: TSH: 1.24 u[IU]/mL (ref 0.35–4.50)

## 2019-02-07 LAB — COMPREHENSIVE METABOLIC PANEL
ALT: 21 U/L (ref 0–35)
AST: 17 U/L (ref 0–37)
Albumin: 4.2 g/dL (ref 3.5–5.2)
Alkaline Phosphatase: 60 U/L (ref 39–117)
BILIRUBIN TOTAL: 0.6 mg/dL (ref 0.2–1.2)
BUN: 14 mg/dL (ref 6–23)
CO2: 28 meq/L (ref 19–32)
Calcium: 9 mg/dL (ref 8.4–10.5)
Chloride: 108 mEq/L (ref 96–112)
Creatinine, Ser: 0.81 mg/dL (ref 0.40–1.20)
GFR: 69.83 mL/min (ref >60.00)
Glucose, Bld: 82 mg/dL (ref 70–99)
Potassium: 4.4 mEq/L (ref 3.5–5.1)
Sodium: 141 mEq/L (ref 135–145)
Total Protein: 6.7 g/dL (ref 6.0–8.3)

## 2019-02-07 LAB — LIPID PANEL
Cholesterol: 158 mg/dL (ref 0–200)
HDL: 62.7 mg/dL (ref >39.00)
LDL Cholesterol: 81 mg/dL (ref 0–99)
NonHDL: 94.92
Total CHOL/HDL Ratio: 3
Triglycerides: 72 mg/dL (ref 0.0–149.0)
VLDL: 14.4 mg/dL (ref 0.0–40.0)

## 2019-02-07 LAB — MICROALBUMIN / CREATININE URINE RATIO
Creatinine,U: 40.1 mg/dL
Microalb Creat Ratio: 1.7 mg/g (ref 0.0–30.0)
Microalb, Ur: <0.7 mg/dL (ref 0.0–1.9)

## 2019-02-07 MED ORDER — TETANUS-DIPHTH-ACELL PERTUSSIS 5-2.5-18.5 LF-MCG/0.5 IM SUSP: 0.5000 mL | Freq: Once | INTRAMUSCULAR | 0 refills | Status: AC

## 2019-02-07 MED ORDER — PREDNISONE 10 MG PO TABS: ORAL_TABLET | ORAL | 0 refills | Status: DC

## 2019-02-07 MED ORDER — MECLIZINE HCL 25 MG PO TABS: 25.0000 mg | ORAL_TABLET | Freq: Three times a day (TID) | ORAL | 0 refills | Status: DC | PRN

## 2019-02-07 NOTE — Telephone Encounter (Signed)
Verbal given to pharmacy tech, she said she would let Clair Gulling know

## 2019-02-07 NOTE — Patient Instructions (Addendum)
Take your losartan at night instead of morning,  Ok to combine with simvastatin    [facility].  You can add short acting Sudafed PE (lasts about 6 hours) if it does not elevate your blood pressure    I will work out the hormone replacement meds with [employer]

## 2019-02-07 NOTE — Telephone Encounter (Signed)
I will go with what he recommends.  Thank you!

## 2019-02-07 NOTE — Telephone Encounter (Signed)
Michele Fernandez from [place] stated that he was needing clarification on whether you would like the progesterone topical or vaginal. He is recommending topical and said that he could show the pt how to apply 2 times a week when she uses the estriol.

## 2019-02-07 NOTE — Telephone Encounter (Signed)
Copied from [place] [id number]. Topic: General - Other >> Feb 07, 2019 11:37 AM Yvette Rack wrote: Reason for CRM: Representative from [place] called for clarity on a prescription for progesterone 2%. No listing for the Rx however representative requests call back. Cb# [phone]

## 2019-02-07 NOTE — Progress Notes (Signed)
Patient ID: Michele Fernandez, female    DOB: January 12, 1946  Age: 73 y.o. MRN: [id number]  The patient is here for follow up and  management of other chronic and acute problems.   The risk factors are reflected in the social history.  The roster of all physicians providing medical care to patient - is listed in the Snapshot section of the chart.  Activities of daily living:  The patient is 100% independent in all ADLs: dressing, toileting, feeding as well as independent mobility  Home safety : The patient has smoke detectors in the home. They wear seatbelts.  There are no firearms at home. There is no violence in the home.   There is no risks for hepatitis, STDs or HIV. There is no   history of blood transfusion. They have no travel history to infectious disease endemic areas of the world.  The patient has seen their dentist in the last six month. They have seen their eye doctor in the last year. They admit to slight hearing difficulty with regard to whispered voices and some television programs.  They have deferred audiologic testing in the last year.  They do not  have excessive sun exposure. Discussed the need for sun protection: hats, long sleeves and use of sunscreen if there is significant sun exposure.   Diet: the importance of a healthy diet is discussed. They do have a healthy diet.  The benefits of regular aerobic exercise were discussed. Michele Fernandez walks 4 times per week ,  20 minutes.   Depression screen: there are no signs or vegative symptoms of depression- irritability, change in appetite, anhedonia, sadness/tearfullness.  Cognitive assessment: the patient manages all their financial and personal affairs and is actively engaged. They could relate day,date,year and events; recalled 2/3 objects at 3 minutes; performed clock-face test normally.  The following portions of the patient's history were reviewed and updated as appropriate: allergies, current medications, past family history, past  medical history,  past surgical history, past social history  and problem list.  Visual acuity was not assessed per patient preference since Michele Fernandez has regular follow up with her ophthalmologist. Hearing and body mass index were assessed and reviewed.   During the course of the visit the patient was educated and counseled about appropriate screening and preventive services including : fall prevention , diabetes screening, nutrition counseling, colorectal cancer screening, and recommended immunizations.    CC: The primary encounter diagnosis was Mixed hyperlipidemia. Diagnoses of Essential hypertension, Other osteoporosis without current pathological fracture, Encounter for preventive health examination, and Vertigo were also pertinent to this visit.  1) HTN : home readings at rest are 130/80 max with regular checks.  Sh eis taking losartan once daily in the morning  2) vertigo:  Currently symptomatic,  Occurred after a salty meal.  requesting meclizine,  Ears feel stopped up.  3) osteoporosis by recent DEXA (forearm only)  discussed prior use of therapy     History Michele Fernandez has a past medical history of Cancer ([place]), GERD (gastroesophageal reflux disease), Hypertension, and Other sign and symptom in breast.   Michele Fernandez has a past surgical history that includes Breast biopsy (Bilateral, 1993); Breast biopsy (Left, 1989); Upper gi endoscopy (2006); Hammer toe surgery (2006); Foot surgery (2009, 2012); Cerebral aneurysm repair (1991); Colonoscopy (2014); Eye surgery (Right, 11/2014); and Cataract extraction w/PHACO (Left, 09/13/2015).   Her family history includes Alcohol abuse in her son; Cancer in her brother and maternal aunt; Heart attack in her maternal grandmother.Michele Fernandez reports that Michele Fernandez  quit smoking about 46 years ago. Her smoking use included cigarettes. Michele Fernandez has a 15.00 pack-year smoking history. Michele Fernandez has never used smokeless tobacco. Michele Fernandez reports current alcohol use. Michele Fernandez reports that Michele Fernandez does not use  drugs.  Outpatient Medications Prior to Visit  Medication Sig Dispense Refill  . aspirin EC 81 MG tablet Take 81 mg by mouth every other day.    . Cholecalciferol (VITAMIN D3) 2000 units TABS Take 1 tablet by mouth daily.    . cyclobenzaprine (FLEXERIL) 10 MG tablet Take 1 tablet (10 mg total) by mouth 3 (three) times daily as needed for muscle spasms. 60 tablet 2  . losartan (COZAAR) 50 MG tablet Take 1 tablet (50 mg total) by mouth daily. 90 tablet 3  . Magnesium Oxide 200 MG TABS Take 200 mg by mouth every other day.    . Probiotic Product (ALIGN) 4 MG CAPS Take 1 capsule by mouth daily.    . simvastatin (ZOCOR) 20 MG tablet TAKE ONE TABLET BY MOUTH EVERY DAY AT BEDTIME 90 tablet 1  . hydrochlorothiazide (HYDRODIURIL) 25 MG tablet Take 1 tablet (25 mg total) by mouth daily. 30 tablet 0   No facility-administered medications prior to visit.     Review of Systems  Patient denies headache, fevers, malaise, unintentional weight loss, skin rash, eye pain, sinus congestion and sinus pain, sore throat, dysphagia,  hemoptysis , cough, dyspnea, wheezing, chest pain, palpitations, orthopnea, edema, abdominal pain, nausea, melena, diarrhea, constipation, flank pain, dysuria, hematuria, urinary  Frequency, nocturia, numbness, tingling, seizures,  Focal weakness, Loss of consciousness,  Tremor, insomnia, depression, anxiety, and suicidal ideation.     Objective:  BP (!) 148/76 (BP Location: Left Arm, Patient Position: Sitting, Cuff Size: Normal)   Pulse 75   Temp 97.8 F (36.6 C) (Oral)   Resp 14   Ht 5' 1.5" (1.562 m)   Wt 161 lb 6.4 oz (73.2 kg)   SpO2 98%   BMI 30.00 kg/m   Physical Exam   General appearance: alert, cooperative and appears stated age Head: Normocephalic, without obvious abnormality, atraumatic Eyes: conjunctivae/corneas clear. PERRL, EOM's intact. Fundi benign. Ears: normal TM's and external ear canals both ears Nose: Nares normal. Septum midline. Mucosa normal. No  drainage or sinus tenderness. Throat: lips, mucosa, and tongue normal; teeth and gums normal Neck: no adenopathy, no carotid bruit, no JVD, supple, symmetrical, trachea midline and thyroid not enlarged, symmetric, no tenderness/mass/nodules Lungs: clear to auscultation bilaterally Breasts: normal appearance, no masses or tenderness Heart: regular rate and rhythm, S1, S2 normal, no murmur, click, rub or gallop Abdomen: soft, non-tender; bowel sounds normal; no masses,  no organomegaly Extremities: extremities normal, atraumatic, no cyanosis or edema Pulses: 2+ and symmetric Skin: Skin color, texture, turgor normal. No rashes or lesions Neurologic: Alert and oriented X 3, normal strength and tone. Normal symmetric reflexes. Normal coordination and gait.    Assessment & Plan:   Problem List Items Addressed This Visit    Hypertension    Well controlled on current regimen. Renal function stable, no changes today.      Relevant Orders   Lipid panel (Completed)   TSH (Completed)   Osteoporosis    No change by 2019 DEXA .  Ten year risk of hip fracture is low at 1.4% , low at  9.6% for any major fracture,  No treatment required.       Hyperlipidemia - Primary    LDL is at goal on simvastatin .LFTs are normal,  No changes  today  Lab Results  Component Value Date   CHOL 158 02/07/2019   HDL 62.70 02/07/2019   LDLCALC 81 02/07/2019   LDLDIRECT 139.0 02/01/2016   TRIG 72.0 02/07/2019   CHOLHDL 3 02/07/2019   Lab Results  Component Value Date   ALT 21 02/07/2019   AST 17 02/07/2019   ALKPHOS 60 02/07/2019   BILITOT 0.6 02/07/2019         Relevant Orders   Microalbumin / creatinine urine ratio (Completed)   Comprehensive metabolic panel (Completed)   Encounter for preventive health examination    age appropriate education and counseling updated, referrals for preventative services and immunizations addressed, dietary and smoking counseling addressed, most recent labs reviewed.   I have personally reviewed and have noted:  1) the patient's medical and social history 2) The pt's use of alcohol, tobacco, and illicit drugs 3) The patient's current medications and supplements 4) Functional ability including ADL's, fall risk, home safety risk, hearing and visual impairment 5) Diet and physical activities 6) Evidence for depression or mood disorder 7) The patient's height, weight, and BMI have been recorded in the chart  I have made referrals, and provided counseling and education based on review of the above      Vertigo    Secondary to viral URI.  Prednisone taper ,  meclizine, sudafed PE         I have discontinued Michele Fernandez "Connie"'s hydrochlorothiazide. I am also having her start on predniSONE, meclizine, and Tdap. Additionally, I am having her maintain her Vitamin D3, aspirin EC, Magnesium Oxide, Align, cyclobenzaprine, simvastatin, and losartan.  Meds ordered this encounter  Medications  . predniSONE (DELTASONE) 10 MG tablet    Sig: 6 tablets on Day 1 , then reduce by 1 tablet daily until gone    Dispense:  21 tablet    Refill:  0  . meclizine (ANTIVERT) 25 MG tablet    Sig: Take 1 tablet (25 mg total) by mouth 3 (three) times daily as needed for dizziness.    Dispense:  30 tablet    Refill:  0  . Tdap (BOOSTRIX) 5-2.5-18.5 LF-MCG/0.5 injection    Sig: Inject 0.5 mLs into the muscle once for 1 dose.    Dispense:  0.5 mL    Refill:  0    Medications Discontinued During This Encounter  Medication Reason  . hydrochlorothiazide (HYDRODIURIL) 25 MG tablet Patient has not taken in last 30 days    Follow-up: No follow-ups on file.   Crecencio Mc, MD

## 2019-02-09 DIAGNOSIS — R42 Dizziness and giddiness: Secondary | ICD-10-CM | POA: Insufficient documentation

## 2019-02-09 NOTE — Assessment & Plan Note (Signed)
Secondary to viral URI.  Prednisone taper ,  meclizine, sudafed PE

## 2019-02-09 NOTE — Assessment & Plan Note (Signed)

## 2019-02-09 NOTE — Assessment & Plan Note (Signed)
No change by 2019 DEXA .  Ten year risk of hip fracture is low at 1.4% , low at  9.6% for any major fracture,  No treatment required.

## 2019-02-09 NOTE — Assessment & Plan Note (Signed)
LDL is at goal on simvastatin .LFTs are normal,  No changes today  Lab Results  Component Value Date   CHOL 158 02/07/2019   HDL 62.70 02/07/2019   LDLCALC 81 02/07/2019   LDLDIRECT 139.0 02/01/2016   TRIG 72.0 02/07/2019   CHOLHDL 3 02/07/2019   Lab Results  Component Value Date   ALT 21 02/07/2019   AST 17 02/07/2019   ALKPHOS 60 02/07/2019   BILITOT 0.6 02/07/2019

## 2019-02-09 NOTE — Assessment & Plan Note (Signed)
Well controlled on current regimen. Renal function stable, no changes today. 

## 2019-02-21 NOTE — Telephone Encounter (Signed)
Sent to PCP to advise 

## 2019-05-16 DIAGNOSIS — D225 Melanocytic nevi of trunk: Secondary | ICD-10-CM | POA: Diagnosis not present

## 2019-05-16 DIAGNOSIS — Z09 Encounter for follow-up examination after completed treatment for conditions other than malignant neoplasm: Secondary | ICD-10-CM | POA: Diagnosis not present

## 2019-05-16 DIAGNOSIS — L738 Other specified follicular disorders: Secondary | ICD-10-CM | POA: Diagnosis not present

## 2019-05-16 DIAGNOSIS — D2261 Melanocytic nevi of right upper limb, including shoulder: Secondary | ICD-10-CM | POA: Diagnosis not present

## 2019-05-16 DIAGNOSIS — D2272 Melanocytic nevi of left lower limb, including hip: Secondary | ICD-10-CM | POA: Diagnosis not present

## 2019-05-16 DIAGNOSIS — D2262 Melanocytic nevi of left upper limb, including shoulder: Secondary | ICD-10-CM | POA: Diagnosis not present

## 2019-05-16 DIAGNOSIS — D2271 Melanocytic nevi of right lower limb, including hip: Secondary | ICD-10-CM | POA: Diagnosis not present

## 2019-05-16 DIAGNOSIS — Z872 Personal history of diseases of the skin and subcutaneous tissue: Secondary | ICD-10-CM | POA: Diagnosis not present

## 2019-05-19 NOTE — Telephone Encounter (Signed)
Nexium is not in the pt's current medication list.

## 2019-05-20 ENCOUNTER — Other Ambulatory Visit: Payer: Self-pay | Admitting: Internal Medicine

## 2019-05-20 MED ORDER — ESOMEPRAZOLE MAGNESIUM 40 MG PO CPDR: 40.0000 mg | DELAYED_RELEASE_CAPSULE | Freq: Every day | ORAL | 5 refills | Status: DC

## 2019-06-23 ENCOUNTER — Other Ambulatory Visit: Payer: Self-pay

## 2019-06-23 MED ORDER — SIMVASTATIN 20 MG PO TABS: 20.0000 mg | ORAL_TABLET | Freq: Every day | ORAL | 3 refills | Status: DC

## 2019-06-24 ENCOUNTER — Encounter: Payer: Self-pay | Admitting: General Surgery

## 2019-06-25 DIAGNOSIS — Z1231 Encounter for screening mammogram for malignant neoplasm of breast: Secondary | ICD-10-CM

## 2019-07-01 DIAGNOSIS — Z961 Presence of intraocular lens: Secondary | ICD-10-CM | POA: Diagnosis not present

## 2019-07-07 ENCOUNTER — Telehealth: Payer: Self-pay

## 2019-07-07 NOTE — Telephone Encounter (Signed)
Appt cancelled

## 2019-07-07 NOTE — Telephone Encounter (Signed)
Copied from [place] [phone]. Topic: Appointment Scheduling - Scheduling Inquiry for Clinic >> Jul 07, 2019  8:15 AM Burchel, Abbi R wrote: Reason for CRM:  Pt would like to cancel AWV on 07/08/2019.  Pt is not interested in resch at this time.

## 2019-07-08 ENCOUNTER — Ambulatory Visit: Payer: Medicare HMO

## 2019-08-26 ENCOUNTER — Other Ambulatory Visit: Payer: Self-pay | Admitting: Internal Medicine

## 2019-09-09 DIAGNOSIS — Z1231 Encounter for screening mammogram for malignant neoplasm of breast: Secondary | ICD-10-CM | POA: Diagnosis not present

## 2019-09-10 ENCOUNTER — Telehealth: Payer: Self-pay | Admitting: *Deleted

## 2019-09-10 ENCOUNTER — Encounter: Payer: Self-pay | Admitting: Surgery

## 2019-09-10 NOTE — Telephone Encounter (Signed)
error 

## 2019-09-15 ENCOUNTER — Ambulatory Visit: Payer: Medicare HMO | Admitting: Surgery

## 2019-09-18 DIAGNOSIS — R69 Illness, unspecified: Secondary | ICD-10-CM | POA: Diagnosis not present

## 2019-10-17 DIAGNOSIS — K219 Gastro-esophageal reflux disease without esophagitis: Secondary | ICD-10-CM | POA: Diagnosis not present

## 2019-11-04 ENCOUNTER — Telehealth: Payer: Self-pay | Admitting: Internal Medicine

## 2019-11-04 DIAGNOSIS — E782 Mixed hyperlipidemia: Secondary | ICD-10-CM

## 2019-11-04 DIAGNOSIS — I10 Essential (primary) hypertension: Secondary | ICD-10-CM

## 2019-11-04 NOTE — Telephone Encounter (Signed)
Had to reschedule patient's physical from 11/06/2019 to 01/08/2020, Pt's insurance does not cover a virtual physical. Patient was wondering if she needs to have labs done. Please let me know so I can schedule. Patient was asked before cancelling 11/06/2019 appointment if she had any issues that she would like to discuss with Dr. Derrel Nip, she stated no, and if she does have any issues she will call and make an appointment. Also, patient wanted Dr. Derrel Nip to know that she will make an appointment with Dr. Terri Piedra to have a breast exam.

## 2019-11-05 NOTE — Telephone Encounter (Signed)
Fasting labs ordered

## 2019-11-06 ENCOUNTER — Other Ambulatory Visit: Payer: Self-pay

## 2019-11-06 ENCOUNTER — Other Ambulatory Visit (INDEPENDENT_AMBULATORY_CARE_PROVIDER_SITE_OTHER): Payer: Medicare HMO

## 2019-11-06 ENCOUNTER — Ambulatory Visit: Payer: Medicare HMO | Admitting: Internal Medicine

## 2019-11-06 DIAGNOSIS — I10 Essential (primary) hypertension: Secondary | ICD-10-CM

## 2019-11-06 DIAGNOSIS — E782 Mixed hyperlipidemia: Secondary | ICD-10-CM | POA: Diagnosis not present

## 2019-11-06 LAB — COMPREHENSIVE METABOLIC PANEL
ALT: 19 U/L (ref 0–35)
AST: 18 U/L (ref 0–37)
Albumin: 4.2 g/dL (ref 3.5–5.2)
Alkaline Phosphatase: 58 U/L (ref 39–117)
BUN: 13 mg/dL (ref 6–23)
CO2: 27 mEq/L (ref 19–32)
Calcium: 9 mg/dL (ref 8.4–10.5)
Chloride: 107 mEq/L (ref 96–112)
Creatinine, Ser: 0.83 mg/dL (ref 0.40–1.20)
GFR: 67.75 mL/min (ref >60.00)
Glucose, Bld: 92 mg/dL (ref 70–99)
Potassium: 3.8 mEq/L (ref 3.5–5.1)
Sodium: 142 mEq/L (ref 135–145)
Total Bilirubin: 0.7 mg/dL (ref 0.2–1.2)
Total Protein: 6.6 g/dL (ref 6.0–8.3)

## 2019-11-06 LAB — LIPID PANEL
Cholesterol: 156 mg/dL (ref 0–200)
HDL: 58.5 mg/dL (ref >39.00)
LDL Cholesterol: 85 mg/dL (ref 0–99)
NonHDL: 97.74
Total CHOL/HDL Ratio: 3
Triglycerides: 65 mg/dL (ref 0.0–149.0)
VLDL: 13 mg/dL (ref 0.0–40.0)

## 2019-11-06 NOTE — Telephone Encounter (Signed)
Pt came in this morning to have her lab work drawn.

## 2019-11-21 DIAGNOSIS — K219 Gastro-esophageal reflux disease without esophagitis: Secondary | ICD-10-CM | POA: Diagnosis not present

## 2019-12-19 DIAGNOSIS — Z1159 Encounter for screening for other viral diseases: Secondary | ICD-10-CM | POA: Diagnosis not present

## 2019-12-24 DIAGNOSIS — K219 Gastro-esophageal reflux disease without esophagitis: Secondary | ICD-10-CM | POA: Diagnosis not present

## 2019-12-24 DIAGNOSIS — K317 Polyp of stomach and duodenum: Secondary | ICD-10-CM | POA: Diagnosis not present

## 2019-12-24 DIAGNOSIS — K449 Diaphragmatic hernia without obstruction or gangrene: Secondary | ICD-10-CM | POA: Diagnosis not present

## 2020-01-08 ENCOUNTER — Other Ambulatory Visit: Payer: Self-pay

## 2020-01-08 ENCOUNTER — Encounter: Payer: Self-pay | Admitting: Internal Medicine

## 2020-01-08 ENCOUNTER — Ambulatory Visit: Payer: Medicare HMO | Admitting: Internal Medicine

## 2020-01-08 VITALS — BP 164/74 | HR 68 | Temp 97.9°F | Resp 14 | Ht 61.5 in | Wt 157.8 lb

## 2020-01-08 DIAGNOSIS — K2100 Gastro-esophageal reflux disease with esophagitis, without bleeding: Secondary | ICD-10-CM | POA: Diagnosis not present

## 2020-01-08 DIAGNOSIS — Z0000 Encounter for general adult medical examination without abnormal findings: Secondary | ICD-10-CM

## 2020-01-08 DIAGNOSIS — I10 Essential (primary) hypertension: Secondary | ICD-10-CM | POA: Diagnosis not present

## 2020-01-08 DIAGNOSIS — E782 Mixed hyperlipidemia: Secondary | ICD-10-CM

## 2020-01-08 DIAGNOSIS — M818 Other osteoporosis without current pathological fracture: Secondary | ICD-10-CM

## 2020-01-08 DIAGNOSIS — H9319 Tinnitus, unspecified ear: Secondary | ICD-10-CM

## 2020-01-08 DIAGNOSIS — E663 Overweight: Secondary | ICD-10-CM | POA: Diagnosis not present

## 2020-01-08 MED ORDER — LOSARTAN POTASSIUM-HCTZ 50-12.5 MG PO TABS: 1.0000 | ORAL_TABLET | Freq: Every day | ORAL | 1 refills | Status: DC

## 2020-01-08 NOTE — Assessment & Plan Note (Signed)
mildly elevated.  Adding hctz.   Goal 130/80  RTC to check potassium after starting medication

## 2020-01-08 NOTE — Assessment & Plan Note (Signed)
EGD was normal.  She has suspended Protonix and tried famotidine for a month.  She prefers to manage with diet.

## 2020-01-08 NOTE — Assessment & Plan Note (Signed)
LDL is at goal on simvastatin .LFTs are normal,  No changes today  Lab Results  Component Value Date   CHOL 156 11/06/2019   HDL 58.50 11/06/2019   LDLCALC 85 11/06/2019   LDLDIRECT 139.0 02/01/2016   TRIG 65.0 11/06/2019   CHOLHDL 3 11/06/2019   Lab Results  Component Value Date   ALT 19 11/06/2019   AST 18 11/06/2019   ALKPHOS 58 11/06/2019   BILITOT 0.7 11/06/2019

## 2020-01-08 NOTE — Assessment & Plan Note (Signed)
No change by 2019 DEXA .  Ten year risk of hip fracture is low at 1.4% , low at  9.6% for any major fracture,  No treatment required.

## 2020-01-08 NOTE — Progress Notes (Signed)
Patient ID: Michele Fernandez, female    DOB: Oct 24, 1946  Age: 74 y.o. MRN: [id number]  The patient is here for annual preventive examination and management of other chronic and acute problems.  This visit occurred during the SARS-CoV-2 public health emergency.  Safety protocols were in place, including screening questions prior to the visit, additional usage of staff PPE, and extensive cleaning of exam room while observing appropriate contact time as indicated for disinfecting solutions.     The risk factors are reflected in the social history.  The roster of all physicians providing medical care to patient - is listed in the Snapshot section of the chart.  Activities of daily living:  The patient is 100% independent in all ADLs: dressing, toileting, feeding as well as independent mobility  Home safety : The patient has smoke detectors in the home. They wear seatbelts.  There are no firearms at home. There is no violence in the home.   There is no risks for hepatitis, STDs or HIV. There is no   history of blood transfusion. They have no travel history to infectious disease endemic areas of the world.  The patient has seen their dentist in the last six month. They have seen their eye doctor in the last year.  She denies hearing difficulty with regard to whispered voices and some television programs.  They have deferred audiologic testing in the last year.  They do not  have excessive sun exposure. Discussed the need for sun protection: hats, long sleeves and use of sunscreen if there is significant sun exposure.   Diet: the importance of a healthy diet is discussed. She has a healthy diet.  The benefits of regular aerobic exercise were discussed. She walks 4 times per week ,  45 minutes.   Depression screen: there are no signs or vegative symptoms of depression- irritability, change in appetite, anhedonia, sadness/tearfullness.  Cognitive assessment: the patient manages all their financial  and personal affairs and is actively engaged. They could relate day,date,year and events; recalled 2/3 objects at 3 minutes; performed clock-face test normally.  The following portions of the patient's history were reviewed and updated as appropriate: allergies, current medications, past family history, past medical history,  past surgical history, past social history  and problem list.  Visual acuity was not assessed per patient preference since she has regular follow up with her ophthalmologist. Hearing and body mass index were assessed and reviewed.   During the course of the visit the patient was educated and counseled about appropriate screening and preventive services including : fall prevention , diabetes screening, nutrition counseling, colorectal cancer screening, and recommended immunizations.    CC: The primary encounter diagnosis was Essential hypertension. Diagnoses of Overweight (BMI 25.0-29.9), Encounter for preventive health examination, Gastroesophageal reflux disease with esophagitis without hemorrhage, Mixed hyperlipidemia, Tinnitus aurium, unspecified laterality, and Other osteoporosis without current pathological fracture were also pertinent to this visit.  Saw Magod in November for persistent  esophageal reflux symptoms , had EGD . Nov to Dec     Has been having left sided low back pain aggravated by sitting, radiates from buttock to left thigh and is worse at bedtime. Relieved by walking. No foot drop,  Groin pain or knee pain.   Weight gain:  Frustrated.  Walking or doing a video aerobics class 5 days per week.  Portion size reduction discussed    History Ruchoma has a past medical history of Cancer ([place]), GERD (gastroesophageal reflux disease), Hypertension, and Other sign  and symptom in breast.   She has a past surgical history that includes Breast biopsy (Bilateral, 1993); Breast biopsy (Left, 1989); Upper gi endoscopy (2006); Hammer toe surgery (2006); Foot surgery  (2009, 2012); Cerebral aneurysm repair (1991); Colonoscopy (2014); Eye surgery (Right, 11/2014); and Cataract extraction w/PHACO (Left, 09/13/2015).   Her family history includes Alcohol abuse in her son; Cancer in her brother and maternal aunt; Heart attack in her maternal grandmother.She reports that she quit smoking about 47 years ago. Her smoking use included cigarettes. She has a 15.00 pack-year smoking history. She has never used smokeless tobacco. She reports current alcohol use. She reports that she does not use drugs.  Outpatient Medications Prior to Visit  Medication Sig Dispense Refill  . Cholecalciferol (VITAMIN D3) 2000 units TABS Take 1 tablet by mouth daily.    . cyclobenzaprine (FLEXERIL) 10 MG tablet Take 1 tablet (10 mg total) by mouth 3 (three) times daily as needed for muscle spasms. 60 tablet 2  . famotidine (PEPCID) 20 MG tablet Take 20 mg by mouth 2 (two) times daily.    . Magnesium Oxide 200 MG TABS Take 200 mg by mouth every other day.    . meclizine (ANTIVERT) 25 MG tablet Take 1 tablet (25 mg total) by mouth 3 (three) times daily as needed for dizziness. 30 tablet 0  . Probiotic Product (ALIGN) 4 MG CAPS Take 1 capsule by mouth daily.    . simvastatin (ZOCOR) 20 MG tablet Take 1 tablet (20 mg total) by mouth at bedtime. 90 tablet 3  . losartan (COZAAR) 50 MG tablet TAKE ONE TABLET BY MOUTH EVERY DAY 90 tablet 3  . pantoprazole (PROTONIX) 40 MG tablet Take 40 mg by mouth daily.    Marland Kitchen aspirin EC 81 MG tablet Take 81 mg by mouth every other day.    . esomeprazole (NEXIUM) 40 MG capsule Take 1 capsule (40 mg total) by mouth daily at 12 noon. 30 capsule 5   No facility-administered medications prior to visit.    Review of Systems   Patient denies headache, fevers, malaise, unintentional weight loss, skin rash, eye pain, sinus congestion and sinus pain, sore throat, dysphagia,  hemoptysis , cough, dyspnea, wheezing, chest pain, palpitations, orthopnea, edema, abdominal  pain, nausea, melena, diarrhea, constipation, flank pain, dysuria, hematuria, urinary  Frequency, nocturia, numbness, tingling, seizures,  Focal weakness, Loss of consciousness,  Tremor, insomnia, depression, anxiety, and suicidal ideation.      Objective:  BP (!) 164/74 (BP Location: Left Arm, Patient Position: Sitting, Cuff Size: Normal)   Pulse 68   Temp 97.9 F (36.6 C) (Temporal)   Resp 14   Ht 5' 1.5" (1.562 m)   Wt 157 lb 12.8 oz (71.6 kg)   SpO2 99%   BMI 29.33 kg/m   Physical Exam  General appearance: alert, cooperative and appears stated age Ears: normal TM's and external ear canals both ears Throat: lips, mucosa, and tongue normal; teeth and gums normal Neck: no adenopathy, no carotid bruit, supple, symmetrical, trachea midline and thyroid not enlarged, symmetric, no tenderness/mass/nodules Back: symmetric, no curvature. ROM normal. No CVA tenderness. Lungs: clear to auscultation bilaterally Heart: regular rate and rhythm, S1, S2 normal, no murmur, click, rub or gallop Abdomen: soft, non-tender; bowel sounds normal; no masses,  no organomegaly Pulses: 2+ and symmetric Skin: Skin color, texture, turgor normal. No rashes or lesions Lymph nodes: Cervical, supraclavicular, and axillary nodes normal.  Assessment & Plan:   Problem List Items Addressed This Visit  Unprioritized   Encounter for preventive health examination    age appropriate education and counseling updated, referrals for preventative services and immunizations addressed, dietary and smoking counseling addressed, most recent labs reviewed.  I have personally reviewed and have noted:  1) the patient's medical and social history 2) The pt's use of alcohol, tobacco, and illicit drugs 3) The patient's current medications and supplements 4) Functional ability including ADL's, fall risk, home safety risk, hearing and visual impairment 5) Diet and physical activities 6) Evidence for depression or mood  disorder 7) The patient's height, weight, and BMI have been recorded in the chart  I have made referrals, and provided counseling and education based on review of the above      GERD (gastroesophageal reflux disease)    EGD was normal.  She has suspended Protonix and tried famotidine for a month.  She prefers to manage with diet.       Relevant Medications   famotidine (PEPCID) 20 MG tablet   Hyperlipidemia    LDL is at goal on simvastatin .LFTs are normal,  No changes today  Lab Results  Component Value Date   CHOL 156 11/06/2019   HDL 58.50 11/06/2019   LDLCALC 85 11/06/2019   LDLDIRECT 139.0 02/01/2016   TRIG 65.0 11/06/2019   CHOLHDL 3 11/06/2019   Lab Results  Component Value Date   ALT 19 11/06/2019   AST 18 11/06/2019   ALKPHOS 58 11/06/2019   BILITOT 0.7 11/06/2019         Relevant Medications   losartan-hydrochlorothiazide (HYZAAR) 50-12.5 MG tablet   Hypertension - Primary    mildly elevated.  Adding hctz.   Goal 130/80  RTC to check potassium after starting medication       Relevant Medications   losartan-hydrochlorothiazide (HYZAAR) 50-12.5 MG tablet   Other Relevant Orders   Basic metabolic panel   Osteoporosis    No change by 2019 DEXA .  Ten year risk of hip fracture is low at 1.4% , low at  9.6% for any major fracture,  No treatment required.       Overweight (BMI 25.0-29.9)    I have addressed  BMI and recommended a low glycemic index diet utilizing smaller more frequent meals to increase metabolism.  I have also recommended that patient start exercising with a goal of 30 minutes of aerobic exercise a minimum of 5 days per week. Screening for lipid disorders, thyroid and diabetes to be done today.        Tinnitus aurium, unspecified laterality    She has been given extension exercises and demonstrated an improvement in symptoms.           I have discontinued Mailee K. Morsch "Connie"'s aspirin EC, esomeprazole, losartan, and  pantoprazole. I am also having her start on losartan-hydrochlorothiazide. Additionally, I am having her maintain her Vitamin D3, Magnesium Oxide, Align, cyclobenzaprine, meclizine, simvastatin, and famotidine.  Meds ordered this encounter  Medications  . losartan-hydrochlorothiazide (HYZAAR) 50-12.5 MG tablet    Sig: Take 1 tablet by mouth daily.    Dispense:  90 tablet    Refill:  1    Medications Discontinued During This Encounter  Medication Reason  . aspirin EC 81 MG tablet Patient Preference  . esomeprazole (NEXIUM) 40 MG capsule Patient has not taken in last 30 days  . losartan (COZAAR) 50 MG tablet   . pantoprazole (PROTONIX) 40 MG tablet     Follow-up: No follow-ups on file.   Aris Everts  Derrel Nip, MD

## 2020-01-08 NOTE — Assessment & Plan Note (Signed)
She has been given extension exercises and demonstrated an improvement in symptoms.

## 2020-01-08 NOTE — Assessment & Plan Note (Signed)

## 2020-01-08 NOTE — Patient Instructions (Signed)
Return in 3-4 weeks for BMET to check potassium after starting hctz  Esophageal spasm may be causing the symptoms you are having  This can be treated with an antspasmodic called hyoscyamine (sublingual) if bothersome

## 2020-01-08 NOTE — Assessment & Plan Note (Signed)
I have addressed  BMI and recommended a low glycemic index diet utilizing smaller more frequent meals to increase metabolism.  I have also recommended that patient start exercising with a goal of 30 minutes of aerobic exercise a minimum of 5 days per week. Screening for lipid disorders, thyroid and diabetes to be done today.   

## 2020-02-05 ENCOUNTER — Other Ambulatory Visit: Payer: Medicare HMO

## 2020-02-12 ENCOUNTER — Other Ambulatory Visit: Payer: Self-pay

## 2020-02-12 ENCOUNTER — Other Ambulatory Visit (INDEPENDENT_AMBULATORY_CARE_PROVIDER_SITE_OTHER): Payer: Medicare HMO

## 2020-02-12 DIAGNOSIS — I10 Essential (primary) hypertension: Secondary | ICD-10-CM | POA: Diagnosis not present

## 2020-02-12 LAB — BASIC METABOLIC PANEL
BUN: 15 mg/dL (ref 6–23)
CO2: 28 mEq/L (ref 19–32)
Calcium: 8.9 mg/dL (ref 8.4–10.5)
Chloride: 107 mEq/L (ref 96–112)
Creatinine, Ser: 0.84 mg/dL (ref 0.40–1.20)
GFR: 66.77 mL/min (ref >60.00)
Glucose, Bld: 85 mg/dL (ref 70–99)
Potassium: 4.3 mEq/L (ref 3.5–5.1)
Sodium: 141 mEq/L (ref 135–145)

## 2020-02-25 ENCOUNTER — Ambulatory Visit
Admission: RE | Admit: 2020-02-25 | Discharge: 2020-02-25 | Disposition: A | Discharge status: Home / Self Care | Payer: Medicare HMO | Source: Ambulatory Visit | Attending: Internal Medicine | Admitting: Internal Medicine

## 2020-02-25 ENCOUNTER — Telehealth: Payer: Self-pay | Admitting: Internal Medicine

## 2020-02-25 ENCOUNTER — Ambulatory Visit: Payer: Medicare HMO | Admitting: Internal Medicine

## 2020-02-25 ENCOUNTER — Other Ambulatory Visit: Payer: Self-pay

## 2020-02-25 ENCOUNTER — Encounter: Payer: Self-pay | Admitting: Internal Medicine

## 2020-02-25 VITALS — BP 130/84 | HR 81 | Temp 98.2°F | Ht 61.5 in | Wt 159.6 lb

## 2020-02-25 DIAGNOSIS — R0781 Pleurodynia: Secondary | ICD-10-CM | POA: Diagnosis present

## 2020-02-25 DIAGNOSIS — R0789 Other chest pain: Secondary | ICD-10-CM

## 2020-02-25 NOTE — Telephone Encounter (Signed)
Tiffany from [facility] called and said the report is ready for this patient.

## 2020-02-25 NOTE — Progress Notes (Signed)
Chief Complaint  Patient presents with  . Fall    Pt fell last night 3/23 while looking at her phone and then tripping over something. Pt brooke her fall with her full weight landing on the arm of a chair on the right side. Pt states it hurts to move, bend over, and breathe. Pain 5/10 currently.    Holding cell phone and tripped 02/24/20 8 pm on cardboard barrier and fell on upholstered chair made out of materials and wood c/o right rib pain 5/10 worse with breathing unable to wear a bra nothing tried   Review of Systems  Respiratory: Negative for shortness of breath.   Cardiovascular: Negative for chest pain.  Musculoskeletal: Positive for joint pain.   Past Medical History:  Diagnosis Date  . Cancer ([place])    skin  . GERD (gastroesophageal reflux disease)   . Hypertension   . Other sign and symptom in breast    Past Surgical History:  Procedure Laterality Date  . BREAST BIOPSY Bilateral 1993  . BREAST BIOPSY Left 1989  . CATARACT EXTRACTION W/PHACO Left 09/13/2015   Procedure: CATARACT EXTRACTION PHACO AND INTRAOCULAR LENS PLACEMENT (IOC);  Surgeon: Estill Cotta, MD;  Location: [facility] ORS;  Service: Ophthalmology;  Laterality: Left;  [id number] H US:01:22.4 AP%:26.1 CDE:37.12  . CEREBRAL ANEURYSM REPAIR  1991  . COLONOSCOPY  2014   [facility], benign colonic mucosa.  Marland Kitchen EYE SURGERY Right 11/2014  . FOOT SURGERY  2009, 2012  . [place]  2006  . UPPER GI ENDOSCOPY  2006   Family History  Problem Relation Age of Onset  . Alcohol abuse Son   . Cancer Maternal Aunt        ovarian  . Heart attack Maternal Grandmother   . Cancer Brother        lung ca,   sepsis pneumonia   Social History   Socioeconomic History  . Marital status: Married    Spouse name: Not on file  . Number of children: Not on file  . Years of education: Not on file  . Highest education level: Not on file  Occupational History  . Not on file  Tobacco Use  . Smoking status:  Former Smoker    Packs/day: 1.00    Years: 15.00    Pack years: 15.00    Types: Cigarettes    Quit date: 12/10/1972    Years since quitting: 47.2  . Smokeless tobacco: Never Used  Substance and Sexual Activity  . Alcohol use: Yes    Comment: rare  . Drug use: No  . Sexual activity: Yes  Other Topics Concern  . Not on file  Social History Narrative   Married lives with husband    Social Determinants of Health   Financial Resource Strain:   . Difficulty of Paying Living Expenses:   Food Insecurity:   . Worried About Charity fundraiser in the Last Year:   . Arboriculturist in the Last Year:   Transportation Needs:   . Film/video editor (Medical):   Marland Kitchen Lack of Transportation (Non-Medical):   Physical Activity:   . Days of Exercise per Week:   . Minutes of Exercise per Session:   Stress:   . Feeling of Stress :   Social Connections:   . Frequency of Communication with Friends and Family:   . Frequency of Social Gatherings with Friends and Family:   . Attends Religious Services:   . Active Member of Clubs  or Organizations:   . Attends Archivist Meetings:   Marland Kitchen Marital Status:   Intimate Partner Violence:   . Fear of Current or Ex-Partner:   . Emotionally Abused:   Marland Kitchen Physically Abused:   . Sexually Abused:    Current Meds  Medication Sig  . Cholecalciferol (VITAMIN D3) 2000 units TABS Take 1 tablet by mouth daily.  Marland Kitchen losartan-hydrochlorothiazide (HYZAAR) 50-12.5 MG tablet Take 1 tablet by mouth daily.  . Magnesium Oxide 200 MG TABS Take 200 mg by mouth every other day.  . Probiotic Product (ALIGN) 4 MG CAPS Take 1 capsule by mouth 3 (three) times a week.   . simvastatin (ZOCOR) 20 MG tablet Take 1 tablet (20 mg total) by mouth at bedtime.   Allergies  Allergen Reactions  . Biaxin [Clarithromycin] Nausea Only    Metallic taste  . Demerol [Meperidine] Nausea And Vomiting    IV Demerol Only  . 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   Recent Results (from the past 2160 hour(s))  Basic metabolic panel     Status: None   Collection Time: 02/12/20  8:13 AM  Result Value Ref Range   Sodium 141 135 - 145 mEq/L   Potassium 4.3 3.5 - 5.1 mEq/L   Chloride 107 96 - 112 mEq/L   CO2 28 19 - 32 mEq/L   Glucose, Bld 85 70 - 99 mg/dL   BUN 15 6 - 23 mg/dL   Creatinine, Ser 0.84 0.40 - 1.20 mg/dL   GFR 66.77 >60.00 mL/min   Calcium 8.9 8.4 - 10.5 mg/dL   Objective  Body mass index is 29.67 kg/m. Wt Readings from Last 3 Encounters:  02/25/20 159 lb 9.6 oz (72.4 kg)  01/08/20 157 lb 12.8 oz (71.6 kg)  02/07/19 161 lb 6.4 oz (73.2 kg)   Temp Readings from Last 3 Encounters:  02/25/20 98.2 F (36.8 C) (Temporal)  01/08/20 97.9 F (36.6 C) (Temporal)  02/07/19 97.8 F (36.6 C) (Oral)   BP Readings from Last 3 Encounters:  02/25/20 130/84  01/08/20 (!) 164/74  02/07/19 (!) 148/76   Pulse Readings from Last 3 Encounters:  02/25/20 81  01/08/20 68  02/07/19 75    Physical Exam Vitals reviewed.  Constitutional:      Appearance: Normal appearance. She is well-developed and well-groomed.  HENT:     Head: Normocephalic and atraumatic.  Cardiovascular:     Rate and Rhythm: Normal rate and regular rhythm.     Heart sounds: Normal heart sounds.  Pulmonary:     Effort: Pulmonary effort is normal.     Breath sounds: Normal breath sounds.  Musculoskeletal:       Arms:  Skin:    General: Skin is warm and dry.  Neurological:     General: No focal deficit present.     Mental Status: She is alert and oriented to person, place, and time. Mental status is at baseline.     Gait: Gait normal.  Psychiatric:        Attention and Perception: Attention and perception normal.        Mood and Affect: Mood and affect normal.        Speech: Speech normal.        Behavior: Behavior normal. Behavior is cooperative.        Thought Content: Thought content normal.        Cognition and Memory: Cognition and  memory normal.  Judgment: Judgment normal.     Assessment  Plan  Rib pain on right side - Plan: DG Ribs Unilateral Right  Prn tylenol  Use pillow with coughing, sneezing, breathing to help  Lidocaine patch otc  Will call back if wants tramadol or percocet  F/u PCP as sch  Of note had 2/2 pfizer covid vaccines   Provider: Dr. Olivia Mackie McLean-Scocuzza-Internal Medicine

## 2020-02-25 NOTE — Patient Instructions (Signed)
Tylenol no more than 4000 total in 1 day  Lidocaine pain patch   Rib Fracture  A rib fracture is a break or crack in one of the bones of the ribs. The ribs are long, curved bones that wrap around your chest and attach to your spine and your breastbone. The ribs protect your heart, lungs, and other organs in the chest. A broken or cracked rib is often painful but is not usually serious. Most rib fractures heal on their own over time. However, rib fractures can be more serious if multiple ribs are broken or if broken ribs move out of place and push against other structures or organs. What are the causes? This condition is caused by:  Repetitive movements with high force, such as pitching a baseball or having severe coughing spells.  A direct blow to the chest, such as a sports injury, a car accident, or a fall.  Cancer that has spread to the bones, which can weaken bones and cause them to break. What are the signs or symptoms? Symptoms of this condition include:  Pain when you breathe in or cough.  Pain when someone presses on the injured area.  Feeling short of breath. How is this diagnosed? This condition is diagnosed with a physical exam and medical history. Imaging tests may also be done, such as:  Chest X-ray.  CT scan.  MRI.  Bone scan.  Chest ultrasound. How is this treated? Treatment for this condition depends on the severity of the fracture. Most rib fractures usually heal on their own in 1-3 months. Sometimes healing takes longer if there is a cough that does not stop or if there are other activities that make the injury worse (aggravating factors). While you heal, you will be given medicines to control the pain. You will also be taught deep breathing exercises. Severe injuries may require hospitalization or surgery. Follow these instructions at home: Managing pain, stiffness, and swelling  If directed, apply ice to the injured area. ? Put ice in a plastic  bag. ? Place a towel between your skin and the bag. ? Leave the ice on for 20 minutes, 2-3 times a day.  Take over-the-counter and prescription medicines only as told by your health care provider. Activity  Avoid a lot of activity and any activities or movements that cause pain. Be careful during activities and avoid bumping the injured rib.  Slowly increase your activity as told by your health care provider. General instructions  Do deep breathing exercises as told by your health care provider. This helps prevent pneumonia, which is a common complication of a broken rib. Your health care provider may instruct you to: ? Take deep breaths several times a day. ? Try to cough several times a day, holding a pillow against the injured area. ? Use a device called incentive spirometer to practice deep breathing several times a day.  Drink enough fluid to keep your urine pale yellow.  Do not wear a rib belt or binder. These restrict breathing, which can lead to pneumonia.  Keep all follow-up visits as told by your health care provider. This is important. Contact a health care provider if:  You have a fever. Get help right away if:  You have difficulty breathing or you are short of breath.  You develop a cough that does not stop, or you cough up thick or bloody sputum.  You have nausea, vomiting, or pain in your abdomen.  Your pain gets worse and medicine does  not help. Summary  A rib fracture is a break or crack in one of the bones of the ribs.  A broken or cracked rib is often painful but is not usually serious.  Most rib fractures heal on their own over time.  Treatment for this condition depends on the severity of the fracture.  Avoid a lot of activity and any activities or movements that cause pain. This information is not intended to replace advice given to you by your health care provider. Make sure you discuss any questions you have with your health care  provider. Document Revised: 11/02/2017 Document Reviewed: 02/19/2017 Elsevier Patient Education  [place].

## 2020-02-25 NOTE — Telephone Encounter (Signed)
See result note for 02/25/20 imaging.

## 2020-03-10 ENCOUNTER — Telehealth: Payer: Self-pay | Admitting: Internal Medicine

## 2020-03-10 MED ORDER — TRAMADOL HCL 50 MG PO TABS: 50.0000 mg | ORAL_TABLET | Freq: Two times a day (BID) | ORAL | 0 refills | Status: AC | PRN

## 2020-03-10 NOTE — Telephone Encounter (Signed)
Left message for patient to call back and schedule Medicare Annual Wellness Visit (AWV) either virtually or audio only.  Last AWV 08/08/16; please schedule at anytime with Denisa O'Brien-Blaney at [facility].

## 2020-03-10 NOTE — Addendum Note (Signed)
Addended by: Orland Mustard on: 03/10/2020 03:04 PM   Modules accepted: Orders

## 2020-05-19 ENCOUNTER — Ambulatory Visit: Payer: Medicare HMO | Admitting: Podiatry

## 2020-06-02 ENCOUNTER — Encounter: Payer: Self-pay | Admitting: Podiatry

## 2020-06-02 ENCOUNTER — Ambulatory Visit: Payer: Medicare HMO | Admitting: Podiatry

## 2020-06-02 ENCOUNTER — Other Ambulatory Visit: Payer: Self-pay

## 2020-06-02 VITALS — BP 156/77 | HR 71 | Resp 16 | Ht 61.0 in | Wt 158.0 lb

## 2020-06-02 DIAGNOSIS — L600 Ingrowing nail: Secondary | ICD-10-CM | POA: Diagnosis not present

## 2020-06-02 NOTE — Progress Notes (Signed)
Subjective:  Patient ID: Michele Fernandez, female    DOB: 1946-07-14,  MRN: [id number] HPI Chief Complaint  Patient presents with  . Nail Problem    "I think i have an ingrown toenail on my right big toe.  It doesn't hurt all the time.  It's been bothering me for about four to five weeks.  I trimmed the nail down on that corner.  It hasn't gotten worse, it's about the same.  When I wear tennis shoes, boy does it hurt."    74 y.o. female presents with the above complaint.   ROS: Denies fever chills nausea vomiting muscle aches pains calf pain back pain chest pain shortness of breath.  Past Medical History:  Diagnosis Date  . Cancer ([place])    skin  . GERD (gastroesophageal reflux disease)   . Hypertension   . Other sign and symptom in breast    Past Surgical History:  Procedure Laterality Date  . BREAST BIOPSY Bilateral 1993  . BREAST BIOPSY Left 1989  . CATARACT EXTRACTION W/PHACO Left 09/13/2015   Procedure: CATARACT EXTRACTION PHACO AND INTRAOCULAR LENS PLACEMENT (IOC);  Surgeon: Estill Cotta, MD;  Location: [facility] ORS;  Service: Ophthalmology;  Laterality: Left;  DXI#[id number] H US:01:22.4 AP%:26.1 CDE:37.12  . CEREBRAL ANEURYSM REPAIR  1991  . COLONOSCOPY  2014   [facility], benign colonic mucosa.  Marland Kitchen EYE SURGERY Right 11/2014  . FOOT SURGERY  2009, 2012  . [place]  2006  . UPPER GI ENDOSCOPY  2006    Current Outpatient Medications:  .  Cholecalciferol (VITAMIN D3) 2000 units TABS, Take 1 tablet by mouth daily., Disp: , Rfl:  .  losartan-hydrochlorothiazide (HYZAAR) 50-12.5 MG tablet, Take 1 tablet by mouth daily., Disp: 90 tablet, Rfl: 1 .  Magnesium Oxide 200 MG TABS, Take 200 mg by mouth every other day., Disp: , Rfl:  .  Probiotic Product (ALIGN) 4 MG CAPS, Take 1 capsule by mouth 3 (three) times a week. , Disp: , Rfl:  .  simvastatin (ZOCOR) 20 MG tablet, Take 1 tablet (20 mg total) by mouth at bedtime., Disp: 90 tablet, Rfl: 3 .   cyclobenzaprine (FLEXERIL) 10 MG tablet, Take 1 tablet (10 mg total) by mouth 3 (three) times daily as needed for muscle spasms. (Patient not taking: Reported on 02/25/2020), Disp: 60 tablet, Rfl: 2 .  meclizine (ANTIVERT) 25 MG tablet, Take 1 tablet (25 mg total) by mouth 3 (three) times daily as needed for dizziness. (Patient not taking: Reported on 02/25/2020), Disp: 30 tablet, Rfl: 0  Allergies  Allergen Reactions  . Biaxin [Clarithromycin] Nausea Only    Metallic taste  . Demerol [Meperidine] Nausea And Vomiting    IV Demerol Only  . 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   Review of Systems Objective:   Vitals:   06/02/20 0930  BP: (!) 156/77  Pulse: 71  Resp: 16    General: Well developed, nourished, in no acute distress, alert and oriented x3   Dermatological: Skin is warm, dry and supple bilateral. Nails x 10 are well maintained; remaining integument appears unremarkable at this time. There are no open sores, no preulcerative lesions, no rash or signs of infection present.  Vascular: Dorsalis Pedis artery and Posterior Tibial artery pedal pulses are 2/4 bilateral with immedate capillary fill time. Pedal hair growth present. No varicosities and no lower extremity edema present bilateral.   Neruologic: Grossly intact via light touch bilateral. Vibratory  intact via tuning fork bilateral. Protective threshold with Semmes Wienstein monofilament intact to all pedal sites bilateral. Patellar and Achilles deep tendon reflexes 2+ bilateral. No Babinski or clonus noted bilateral.   Musculoskeletal: No gross boney pedal deformities bilateral. No pain, crepitus, or limitation noted with foot and ankle range of motion bilateral. Muscular strength 5/5 in all groups tested bilateral.  Gait: Unassisted, Nonantalgic.    Radiographs:  None taken  Assessment & Plan:   Assessment: Ingrown nail noncomplicated fibular border hallux right  Plan: Trim the  edge out for her today discussed the possible need for surgical repair in the future she understands and will follow up with me as needed     Briana Farner T. [place], [place]

## 2020-07-05 ENCOUNTER — Other Ambulatory Visit: Payer: Self-pay | Admitting: Internal Medicine

## 2020-07-30 ENCOUNTER — Other Ambulatory Visit: Payer: Self-pay | Admitting: Internal Medicine

## 2020-08-20 ENCOUNTER — Ambulatory Visit: Payer: Medicare HMO | Admitting: Internal Medicine

## 2020-08-20 ENCOUNTER — Encounter: Payer: Self-pay | Admitting: Internal Medicine

## 2020-08-20 ENCOUNTER — Other Ambulatory Visit: Payer: Self-pay

## 2020-08-20 VITALS — BP 146/86 | HR 72 | Temp 98.2°F | Resp 16 | Ht 61.0 in | Wt 157.0 lb

## 2020-08-20 DIAGNOSIS — E782 Mixed hyperlipidemia: Secondary | ICD-10-CM | POA: Diagnosis not present

## 2020-08-20 DIAGNOSIS — Z889 Allergy status to unspecified drugs, medicaments and biological substances status: Secondary | ICD-10-CM

## 2020-08-20 DIAGNOSIS — I10 Essential (primary) hypertension: Secondary | ICD-10-CM | POA: Diagnosis not present

## 2020-08-20 DIAGNOSIS — R252 Cramp and spasm: Secondary | ICD-10-CM | POA: Diagnosis not present

## 2020-08-20 LAB — COMPREHENSIVE METABOLIC PANEL
ALT: 20 U/L (ref 0–35)
AST: 20 U/L (ref 0–37)
Albumin: 4.3 g/dL (ref 3.5–5.2)
Alkaline Phosphatase: 53 U/L (ref 39–117)
BUN: 15 mg/dL (ref 6–23)
CO2: 27 mEq/L (ref 19–32)
Calcium: 9.2 mg/dL (ref 8.4–10.5)
Chloride: 106 mEq/L (ref 96–112)
Creatinine, Ser: 0.82 mg/dL (ref 0.40–1.20)
GFR: 68.55 mL/min (ref >60.00)
Glucose, Bld: 89 mg/dL (ref 70–99)
Potassium: 3.9 mEq/L (ref 3.5–5.1)
Sodium: 141 mEq/L (ref 135–145)
Total Bilirubin: 0.6 mg/dL (ref 0.2–1.2)
Total Protein: 6.4 g/dL (ref 6.0–8.3)

## 2020-08-20 LAB — CK: Total CK: 107 U/L (ref 7–177)

## 2020-08-20 LAB — LIPID PANEL
Cholesterol: 162 mg/dL (ref 0–200)
HDL: 65 mg/dL (ref >39.00)
LDL Cholesterol: 82 mg/dL (ref 0–99)
NonHDL: 96.57
Total CHOL/HDL Ratio: 2
Triglycerides: 71 mg/dL (ref 0.0–149.0)
VLDL: 14.2 mg/dL (ref 0.0–40.0)

## 2020-08-20 LAB — TSH: TSH: 1.9 u[IU]/mL (ref 0.35–4.50)

## 2020-08-20 NOTE — Patient Instructions (Signed)
For leg cramps:  Try drinking 1 ounce of dill pickle juice daily (salt/turmeric )    Your CA 125 and pelvic US were normal in August 2016

## 2020-08-20 NOTE — Progress Notes (Signed)
Subjective:  Patient ID: Michele Fernandez, female    DOB: August 22, 1946  Age: 74 y.o. MRN: [id number]  CC: The primary encounter diagnosis was Muscle cramps. Diagnoses of Mixed hyperlipidemia, Essential hypertension, and Allergy history, drug were also pertinent to this visit.  HPI Michele Fernandez presents for follow up on hypertension, hyperlipidemia  HTN: HCTZ added in February  For readings of 277 systolic.  Since adding the hctz  Her Home readings have been 412 to 878 systolic. She has been having thigh cramps and calf cramps   Had tongue swelling 30 minutes after the second COVID vaccine, discussed risk of increased reaction after second one and advised not to have the booster .    Outpatient Medications Prior to Visit  Medication Sig Dispense Refill  . Cholecalciferol (VITAMIN D3) 2000 units TABS Take 1 tablet by mouth daily.    Marland Kitchen losartan-hydrochlorothiazide (HYZAAR) 50-12.5 MG tablet TAKE ONE TABLET EVERY DAY 90 tablet 1  . Magnesium Oxide 200 MG TABS Take 200 mg by mouth every other day.    . Probiotic Product (ALIGN) 4 MG CAPS Take 1 capsule by mouth 3 (three) times a week.     . simvastatin (ZOCOR) 20 MG tablet TAKE ONE TABLET BY MOUTH AT BEDTIME 90 tablet 1  . cyclobenzaprine (FLEXERIL) 10 MG tablet Take 1 tablet (10 mg total) by mouth 3 (three) times daily as needed for muscle spasms. (Patient not taking: Reported on 02/25/2020) 60 tablet 2  . meclizine (ANTIVERT) 25 MG tablet Take 1 tablet (25 mg total) by mouth 3 (three) times daily as needed for dizziness. (Patient not taking: Reported on 02/25/2020) 30 tablet 0   No facility-administered medications prior to visit.    Review of Systems;  Patient denies headache, fevers, malaise, unintentional weight loss, skin rash, eye pain, sinus congestion and sinus pain, sore throat, dysphagia,  hemoptysis , cough, dyspnea, wheezing, chest pain, palpitations, orthopnea, edema, abdominal pain, nausea, melena, diarrhea,  constipation, flank pain, dysuria, hematuria, urinary  Frequency, nocturia, numbness, tingling, seizures,  Focal weakness, Loss of consciousness,  Tremor, insomnia, depression, anxiety, and suicidal ideation.      Objective:  BP (!) 146/86 (BP Location: Right Arm, Patient Position: Sitting, Cuff Size: Normal)   Pulse 72   Temp 98.2 F (36.8 C) (Oral)   Resp 16   Ht 5\' 1"  (1.549 m)   Wt 157 lb (71.2 kg)   SpO2 99%   BMI 29.66 kg/m   BP Readings from Last 3 Encounters:  08/20/20 (!) 146/86  06/02/20 (!) 156/77  02/25/20 130/84    Wt Readings from Last 3 Encounters:  08/20/20 157 lb (71.2 kg)  06/02/20 158 lb (71.7 kg)  02/25/20 159 lb 9.6 oz (72.4 kg)    General appearance: alert, cooperative and appears stated age Ears: normal TM's and external ear canals both ears Throat: lips, mucosa, and tongue normal; teeth and gums normal Neck: no adenopathy, no carotid bruit, supple, symmetrical, trachea midline and thyroid not enlarged, symmetric, no tenderness/mass/nodules Back: symmetric, no curvature. ROM normal. No CVA tenderness. Lungs: clear to auscultation bilaterally Heart: regular rate and rhythm, S1, S2 normal, no murmur, click, rub or gallop Abdomen: soft, non-tender; bowel sounds normal; no masses,  no organomegaly Pulses: 2+ and symmetric Skin: Skin color, texture, turgor normal. No rashes or lesions Lymph nodes: Cervical, supraclavicular, and axillary nodes normal.  No results found for: HGBA1C  Lab Results  Component Value Date   CREATININE 0.82 08/20/2020   CREATININE 0.84  02/12/2020   CREATININE 0.83 11/06/2019    Lab Results  Component Value Date   WBC 6.4 08/21/2017   HGB 14.1 08/21/2017   HCT 42.6 08/21/2017   PLT 273.0 08/21/2017   GLUCOSE 89 08/20/2020   CHOL 162 08/20/2020   TRIG 71.0 08/20/2020   HDL 65.00 08/20/2020   LDLDIRECT 139.0 02/01/2016   LDLCALC 82 08/20/2020   ALT 20 08/20/2020   AST 20 08/20/2020   NA 141 08/20/2020   K 3.9  08/20/2020   CL 106 08/20/2020   CREATININE 0.82 08/20/2020   BUN 15 08/20/2020   CO2 27 08/20/2020   TSH 1.90 08/20/2020   MICROALBUR <0.7 02/07/2019    DG Ribs Unilateral Right  Result Date: 02/25/2020 CLINICAL DATA:  Fall yesterday with right rib pain. Fall last night striking right side on arm of chair pain laterally radiating anteriorly. EXAM: RIGHT RIBS - 2 VIEW COMPARISON:  None. FINDINGS: Cortical margins of the right ribs are intact. No fracture or other bone lesions are seen involving the ribs. The right lung is clear. No pneumothorax, pleural effusion or pulmonary contusion. IMPRESSION: Negative radiographs of the right ribs.  No rib fracture. Electronically Signed   By: Keith Rake M.D.   On: 02/25/2020 12:11    Assessment & Plan:   Problem List Items Addressed This Visit      Unprioritized   Hypertension    Improved control with hctz and losartan.   Lytes and GFR are unchanged.   Lab Results  Component Value Date   NA 141 08/20/2020   K 3.9 08/20/2020   CL 106 08/20/2020   CO2 27 08/20/2020   Lab Results  Component Value Date   CREATININE 0.82 08/20/2020         Hyperlipidemia    Managed with simvastatin. LDL is < 100 and LFTs are normal.  No changes today   Lab Results  Component Value Date   CHOL 162 08/20/2020   HDL 65.00 08/20/2020   LDLCALC 82 08/20/2020   LDLDIRECT 139.0 02/01/2016   TRIG 71.0 08/20/2020   CHOLHDL 2 08/20/2020   Lab Results  Component Value Date   ALT 20 08/20/2020   AST 20 08/20/2020   ALKPHOS 53 08/20/2020   BILITOT 0.6 08/20/2020         Relevant Orders   Comprehensive metabolic panel (Completed)   Lipid panel (Completed)   Allergy history, drug    To PFIZER MRNA VACCINE AGAINST COVID 19.  She developed angioedema after the second dose.         Other Visit Diagnoses    Muscle cramps    -  Primary   Relevant Orders   TSH (Completed)   CK (Creatine Kinase) (Completed)      I am having Michele K.  Fernandez "Michele Fernandez" maintain her Vitamin D3, Magnesium Oxide, Align, cyclobenzaprine, meclizine, losartan-hydrochlorothiazide, and simvastatin.  No orders of the defined types were placed in this encounter.   There are no discontinued medications.  Follow-up: Return in about 6 months (around 02/17/2021).   Crecencio Mc, MD

## 2020-08-22 DIAGNOSIS — Z889 Allergy status to unspecified drugs, medicaments and biological substances status: Secondary | ICD-10-CM | POA: Insufficient documentation

## 2020-08-22 NOTE — Assessment & Plan Note (Signed)
Improved control with hctz and losartan.   Lytes and GFR are unchanged.   Lab Results  Component Value Date   NA 141 08/20/2020   K 3.9 08/20/2020   CL 106 08/20/2020   CO2 27 08/20/2020   Lab Results  Component Value Date   CREATININE 0.82 08/20/2020

## 2020-08-22 NOTE — Assessment & Plan Note (Signed)
Managed with simvastatin. LDL is < 100 and LFTs are normal.  No changes today   Lab Results  Component Value Date   CHOL 162 08/20/2020   HDL 65.00 08/20/2020   LDLCALC 82 08/20/2020   LDLDIRECT 139.0 02/01/2016   TRIG 71.0 08/20/2020   CHOLHDL 2 08/20/2020   Lab Results  Component Value Date   ALT 20 08/20/2020   AST 20 08/20/2020   ALKPHOS 53 08/20/2020   BILITOT 0.6 08/20/2020

## 2020-08-22 NOTE — Assessment & Plan Note (Signed)
To PFIZER MRNA VACCINE AGAINST COVID 19.  She developed angioedema after the second dose.

## 2020-10-05 ENCOUNTER — Other Ambulatory Visit: Payer: Self-pay | Admitting: Internal Medicine

## 2021-01-05 ENCOUNTER — Telehealth: Payer: Self-pay | Admitting: *Deleted

## 2021-01-05 DIAGNOSIS — E782 Mixed hyperlipidemia: Secondary | ICD-10-CM

## 2021-01-05 DIAGNOSIS — I10 Essential (primary) hypertension: Secondary | ICD-10-CM

## 2021-01-05 NOTE — Telephone Encounter (Signed)
Labs ordered. Thank you

## 2021-01-05 NOTE — Telephone Encounter (Signed)
Please place future orders for lab appt.  

## 2021-01-06 ENCOUNTER — Other Ambulatory Visit: Payer: Self-pay

## 2021-01-06 ENCOUNTER — Other Ambulatory Visit (INDEPENDENT_AMBULATORY_CARE_PROVIDER_SITE_OTHER): Payer: Medicare HMO

## 2021-01-06 DIAGNOSIS — E782 Mixed hyperlipidemia: Secondary | ICD-10-CM

## 2021-01-06 DIAGNOSIS — I10 Essential (primary) hypertension: Secondary | ICD-10-CM | POA: Diagnosis not present

## 2021-01-06 LAB — COMPREHENSIVE METABOLIC PANEL
ALT: 20 U/L (ref 0–35)
AST: 19 U/L (ref 0–37)
Albumin: 3.9 g/dL (ref 3.5–5.2)
Alkaline Phosphatase: 52 U/L (ref 39–117)
BUN: 15 mg/dL (ref 6–23)
CO2: 26 mEq/L (ref 19–32)
Calcium: 8.8 mg/dL (ref 8.4–10.5)
Chloride: 109 mEq/L (ref 96–112)
Creatinine, Ser: 0.81 mg/dL (ref 0.40–1.20)
GFR: 72.59 mL/min (ref >60.00)
Glucose, Bld: 87 mg/dL (ref 70–99)
Potassium: 4.1 mEq/L (ref 3.5–5.1)
Sodium: 140 mEq/L (ref 135–145)
Total Bilirubin: 0.5 mg/dL (ref 0.2–1.2)
Total Protein: 6.2 g/dL (ref 6.0–8.3)

## 2021-01-06 LAB — LIPID PANEL
Cholesterol: 157 mg/dL (ref 0–200)
HDL: 58.3 mg/dL (ref >39.00)
LDL Cholesterol: 87 mg/dL (ref 0–99)
NonHDL: 98.41
Total CHOL/HDL Ratio: 3
Triglycerides: 59 mg/dL (ref 0.0–149.0)
VLDL: 11.8 mg/dL (ref 0.0–40.0)

## 2021-01-10 ENCOUNTER — Telehealth: Payer: Self-pay | Admitting: Internal Medicine

## 2021-01-10 ENCOUNTER — Other Ambulatory Visit: Payer: Self-pay

## 2021-01-10 ENCOUNTER — Encounter: Payer: Self-pay | Admitting: Internal Medicine

## 2021-01-10 ENCOUNTER — Ambulatory Visit (INDEPENDENT_AMBULATORY_CARE_PROVIDER_SITE_OTHER): Payer: Medicare HMO

## 2021-01-10 ENCOUNTER — Ambulatory Visit (INDEPENDENT_AMBULATORY_CARE_PROVIDER_SITE_OTHER): Payer: Medicare HMO | Admitting: Internal Medicine

## 2021-01-10 VITALS — BP 136/78 | HR 83 | Temp 98.1°F | Ht 60.98 in | Wt 160.8 lb

## 2021-01-10 DIAGNOSIS — R140 Abdominal distension (gaseous): Secondary | ICD-10-CM

## 2021-01-10 DIAGNOSIS — M5431 Sciatica, right side: Secondary | ICD-10-CM

## 2021-01-10 DIAGNOSIS — G8929 Other chronic pain: Secondary | ICD-10-CM

## 2021-01-10 DIAGNOSIS — M25551 Pain in right hip: Secondary | ICD-10-CM | POA: Diagnosis not present

## 2021-01-10 DIAGNOSIS — Z9889 Other specified postprocedural states: Secondary | ICD-10-CM | POA: Diagnosis not present

## 2021-01-10 DIAGNOSIS — I700 Atherosclerosis of aorta: Secondary | ICD-10-CM

## 2021-01-10 DIAGNOSIS — Z0001 Encounter for general adult medical examination with abnormal findings: Secondary | ICD-10-CM

## 2021-01-10 DIAGNOSIS — R198 Other specified symptoms and signs involving the digestive system and abdomen: Secondary | ICD-10-CM

## 2021-01-10 DIAGNOSIS — I10 Essential (primary) hypertension: Secondary | ICD-10-CM

## 2021-01-10 DIAGNOSIS — E782 Mixed hyperlipidemia: Secondary | ICD-10-CM

## 2021-01-10 MED ORDER — LOSARTAN POTASSIUM 50 MG PO TABS: ORAL_TABLET | ORAL | 1 refills | Status: DC

## 2021-01-10 MED ORDER — ATORVASTATIN CALCIUM 20 MG PO TABS: 20.0000 mg | ORAL_TABLET | Freq: Every day | ORAL | 3 refills | Status: DC

## 2021-01-10 MED ORDER — HYDROCHLOROTHIAZIDE 12.5 MG PO CAPS: 12.5000 mg | ORAL_CAPSULE | Freq: Every day | ORAL | 1 refills | Status: DC

## 2021-01-10 NOTE — Telephone Encounter (Signed)
lft vm for pt to call ofc to sch US 

## 2021-01-10 NOTE — Progress Notes (Signed)
Patient ID: Michele Fernandez, female    DOB: [birth year]  Age: 75 y.o. MRN: [id number]  The patient is here for annual preventive examination and management of other chronic and acute problems.  This visit occurred during the SARS-CoV-2 public health emergency.  Safety protocols were in place, including screening questions prior to the visit, additional usage of staff PPE, and extensive cleaning of exam room while observing appropriate contact time as indicated for disinfecting solutions.     The risk factors are reflected in the social history.  The roster of all physicians providing medical care to patient - is listed in the Snapshot section of the chart.  Activities of daily living:  The patient is 100% independent in all ADLs: dressing, toileting, feeding as well as independent mobility  Home safety : The patient has smoke detectors in the home. They wear seatbelts.  There are no firearms at home. There is no violence in the home.   There is no risks for hepatitis, STDs or HIV. There is no   history of blood transfusion. They have no travel history to infectious disease endemic areas of the world.  The patient has seen their dentist in the last six month. They have seen their eye doctor in the last year. They admit to slight hearing difficulty with regard to whispered voices and some television programs.  They have deferred audiologic testing in the last year.  They do not  have excessive sun exposure. Discussed the need for sun protection: hats, long sleeves and use of sunscreen if there is significant sun exposure.   Diet: the importance of a healthy diet is discussed. They do have a healthy diet.  The benefits of regular aerobic exercise were discussed. She walks 4 times per week ,  20 minutes.   Depression screen: there are no signs or vegative symptoms of depression- irritability, change in appetite, anhedonia, sadness/tearfullness.  Cognitive assessment: the patient manages all  their financial and personal affairs and is actively engaged. They could relate day,date,year and events; recalled 2/3 objects at 3 minutes; performed clock-face test normally.  The following portions of the patient's history were reviewed and updated as appropriate: allergies, current medications, past family history, past medical history,  past surgical history, past social history  and problem list.  Visual acuity was not assessed per patient preference since she has regular follow up with her ophthalmologist. Hearing and body mass index were assessed and reviewed.   During the course of the visit the patient was educated and counseled about appropriate screening and preventive services including : fall prevention , diabetes screening, nutrition counseling, colorectal cancer screening, and recommended immunizations.    CC: The primary encounter diagnosis was Sciatica, right side. Diagnoses of Chronic hip pain, right, Abdominal bloating, Encounter for general adult medical examination with abnormal findings, History of breast lump/mass excision, Chronic sciatica, right, Chronic right hip pain, Aortic atherosclerosis ([place]), Mixed hyperlipidemia, Primary hypertension, and Abdominal fullness in suprapubic region were also pertinent to this visit.   1) low back pain:  Previously her pain would resolve with walking and exercise, and does transiently improve with back extension exercises,  But now waking her  up at night with sciatic patter of pain on the right despite different positioning.  Improves with advil which she takes sparingly.  Has not tried tylenol.  No foot drop or leg weakness.   2)  Persistent feeling of fullness and heaviness with occasional discomfort lower abdomen/pelvic area.  Not affected by voiding  or defecation. Has a   history of fibroids,  multiple  in uterus by last [place] 2016.  3)  Right hp pain  Hurts when she leads with right leg  up stairs .     History Precilla has a past  medical history of Cancer ([place]), GERD (gastroesophageal reflux disease), Hypertension, and Other sign and symptom in breast.   She has a past surgical history that includes Breast biopsy (Bilateral, 1993); Breast biopsy (Left, 1989); Upper gi endoscopy (2006); Hammer toe surgery (2006); Foot surgery (2009, 2012); Cerebral aneurysm repair (1991); Colonoscopy (2014); Eye surgery (Right, 11/2014); and Cataract extraction w/PHACO (Left, 09/13/2015).   Her family history includes Alcohol abuse in her son; Cancer in her brother and maternal aunt; Heart attack in her maternal grandmother.She reports that she quit smoking about 48 years ago. Her smoking use included cigarettes. She has a 15.00 pack-year smoking history. She has never used smokeless tobacco. She reports previous alcohol use. She reports that she does not use drugs.  Outpatient Medications Prior to Visit  Medication Sig Dispense Refill  . Cholecalciferol (VITAMIN D3) 2000 units TABS Take 1 tablet by mouth daily.    . cyclobenzaprine (FLEXERIL) 10 MG tablet Take 1 tablet (10 mg total) by mouth 3 (three) times daily as needed for muscle spasms. 60 tablet 2  . esomeprazole (NEXIUM) 40 MG capsule     . famotidine (PEPCID) 20 MG tablet 1 tablet    . Magnesium 250 MG TABS 1 tablet with a meal    . Magnesium Oxide 200 MG TABS Take 200 mg by mouth every other day.    . meclizine (ANTIVERT) 25 MG tablet Take 1 tablet (25 mg total) by mouth 3 (three) times daily as needed for dizziness. 30 tablet 0  . Probiotic Product (ALIGN) 4 MG CAPS Take 1 capsule by mouth 3 (three) times a week.     . losartan (COZAAR) 50 MG tablet 1 tablet    . losartan-hydrochlorothiazide (HYZAAR) 50-12.5 MG tablet TAKE ONE TABLET EVERY DAY 90 tablet 1  . simvastatin (ZOCOR) 20 MG tablet TAKE ONE TABLET BY MOUTH AT BEDTIME 90 tablet 1   No facility-administered medications prior to visit.    Review of Systems  Patient denies headache, fevers, malaise, unintentional  weight loss, skin rash, eye pain, sinus congestion and sinus pain, sore throat, dysphagia,  hemoptysis , cough, dyspnea, wheezing, chest pain, palpitations, orthopnea, edema, abdominal pain, nausea, melena, diarrhea, constipation, flank pain, dysuria, hematuria, urinary  Frequency, nocturia, numbness, tingling, seizures,  Focal weakness, Loss of consciousness,  Tremor, insomnia, depression, anxiety, and suicidal ideation.     Objective:  BP 136/78 (BP Location: Left Arm, Patient Position: Sitting)   Pulse 83   Temp 98.1 F (36.7 C)   Ht 5' 0.98" (1.549 m)   Wt 160 lb 12.8 oz (72.9 kg)   SpO2 98%   BMI 30.40 kg/m   Physical Exam  General Appearance:    Alert, cooperative, no distress, appears stated age  Head:    Normocephalic, without obvious abnormality, atraumatic  Eyes:    PERRL, conjunctiva/corneas clear, EOM's intact, fundi    benign, both eyes  Ears:    Normal TM's and external ear canals, both ears  Nose:   Nares normal, septum midline, mucosa normal, no drainage    or sinus tenderness  Throat:   Lips, mucosa, and tongue normal; teeth and gums normal  Neck:   Supple, symmetrical, trachea midline, no adenopathy;    thyroid:  no enlargement/tenderness/nodules; no carotid   bruit or JVD  Back:     Symmetric, no curvature, ROM normal, no CVA tenderness  Lungs:     Clear to auscultation bilaterally, respirations unlabored  Chest Wall:    No tenderness or deformity   Heart:    Regular rate and rhythm, S1 and S2 normal, no murmur, rub   or gallop  Breast Exam:    No tenderness, masses, or nipple abnormality  Abdomen:     Soft, non-tender, bowel sounds active all four quadrants,    no masses, no organomegaly  Genitalia:    Pelvic: cervix normal in appearance, external genitalia normal, no adnexal masses or tenderness, no cervical motion tenderness, rectovaginal septum normal, uterus feels large for age  With pelvic fullness on manual exam. Vagina atrophic appearing mucosa  without  discharge  Extremities:   Extremities normal, atraumatic, no cyanosis or edema.  Positive straight leg lift right leg.  strength 5/5 bilaterally in proximal and distal lower extremities    Pulses:   2+ and symmetric all extremities  Skin:   Skin color, texture, turgor normal, no rashes or lesions  Lymph nodes:   Cervical, supraclavicular, and axillary nodes normal  Neurologic:   CNII-XII intact, normal strength, sensation and reflexes    throughout    Assessment & Plan:   Problem List Items Addressed This Visit      Unprioritized   Abdominal fullness in suprapubic region    Appreciated on pelvic exam and symptomatic per patient.  Given history of fibroids and still possessing ovaries,  Will obtain pelvic ultrasound to evaluate and rule out ovarian mass       Aortic atherosclerosis ([place])    Noted on plain films of lumbar spine and reviewed with patient .  She is tolerating high potency statin therapy and LDL is 87      Relevant Medications   atorvastatin (LIPITOR) 20 MG tablet   hydrochlorothiazide (MICROZIDE) 12.5 MG capsule   losartan (COZAAR) 50 MG tablet   Chronic right hip pain    She has mild degenerative changes with loss of cartilage as well as bony overgrowth at the s/l aspect of the acetabulum .  PT referral recommended.      Chronic sciatica, right    Plain films done of lumbar spine note scoliosis and disk space narrowing from L3 to L5 .  Will recommend PT,  NSAID and tylenol.  If no improvement after 6 weeks,  MRI lumbar spine will be offered       Encounter for general adult medical examination with abnormal findings    age appropriate education and counseling updated, referrals for preventative services and immunizations addressed, dietary and smoking counseling addressed, most recent labs reviewed.  I have personally reviewed and have noted:  1) the patient's medical and social history 2) The pt's use of alcohol, tobacco, and illicit drugs 3) The patient's  current medications and supplements 4) Functional ability including ADL's, fall risk, home safety risk, hearing and visual impairment 5) Diet and physical activities 6) Evidence for depression or mood disorder 7) The patient's height, weight, and BMI have been recorded in the chart  I have made referrals, and provided counseling and education based on review of the above      History of breast lump/mass excision    Annual breast exam done by Dr Bary Castilla . Mammogram is up to date       Hyperlipidemia    With aortic atherosclerosis noted on  lumbar spine films.  continue atorvastatin ,  Goal LDL  100 or less   Lab Results  Component Value Date   CHOL 157 01/06/2021   HDL 58.30 01/06/2021   LDLCALC 87 01/06/2021   LDLDIRECT 139.0 02/01/2016   TRIG 59.0 01/06/2021   CHOLHDL 3 01/06/2021   Lab Results  Component Value Date   ALT 20 01/06/2021   AST 19 01/06/2021   ALKPHOS 52 01/06/2021   BILITOT 0.5 01/06/2021         Relevant Medications   atorvastatin (LIPITOR) 20 MG tablet   hydrochlorothiazide (MICROZIDE) 12.5 MG capsule   losartan (COZAAR) 50 MG tablet   Hypertension    Improved control with hctz and losartan.   Lytes and GFR are unchanged.   Lab Results  Component Value Date   NA 140 01/06/2021   K 4.1 01/06/2021   CL 109 01/06/2021   CO2 26 01/06/2021   Lab Results  Component Value Date   CREATININE 0.81 01/06/2021         Relevant Medications   atorvastatin (LIPITOR) 20 MG tablet   hydrochlorothiazide (MICROZIDE) 12.5 MG capsule   losartan (COZAAR) 50 MG tablet    Other Visit Diagnoses    Sciatica, right side    -  Primary   Relevant Orders   DG Lumbar Spine 2-3 Views (Completed)   Chronic hip pain, right       Relevant Orders   DG Hip Unilat W OR W/O Pelvis 2-3 Views Right (Completed)   Abdominal bloating       Relevant Orders   US Pelvic Complete With Transvaginal      I have discontinued Faviola K. Hotz "Connie"'s simvastatin and  losartan-hydrochlorothiazide. I am also having her start on atorvastatin and hydrochlorothiazide. Additionally, I am having her maintain her Vitamin D3, Magnesium Oxide, Align, cyclobenzaprine, meclizine, esomeprazole, famotidine, Magnesium, and losartan.  Meds ordered this encounter  Medications  . atorvastatin (LIPITOR) 20 MG tablet    Sig: Take 1 tablet (20 mg total) by mouth daily.    Dispense:  90 tablet    Refill:  3  . hydrochlorothiazide (MICROZIDE) 12.5 MG capsule    Sig: Take 1 capsule (12.5 mg total) by mouth daily.    Dispense:  90 capsule    Refill:  1  . losartan (COZAAR) 50 MG tablet    Sig: 1 tablet    Dispense:  90 tablet    Refill:  1    Medications Discontinued During This Encounter  Medication Reason  . simvastatin (ZOCOR) 20 MG tablet   . losartan-hydrochlorothiazide (HYZAAR) 50-12.5 MG tablet   . losartan (COZAAR) 50 MG tablet Reorder    Follow-up: Return in about 6 months (around 07/10/2021).   Crecencio Mc, MD

## 2021-01-10 NOTE — Patient Instructions (Signed)
  You can take up to 2000 mg of acetominophen (tylenol) every day safely  In divided doses (500 mg every 6 hours  Or 1000 mg every 12 hours.)  We can add a once daily anti inflammatory if you need to use advil more than once daily   Splitting the losartan and hctz to evening and morning   Simvastatin changed to atorvastatin   Pelvic ultrasound ordered

## 2021-01-11 DIAGNOSIS — R198 Other specified symptoms and signs involving the digestive system and abdomen: Secondary | ICD-10-CM | POA: Insufficient documentation

## 2021-01-11 DIAGNOSIS — I700 Atherosclerosis of aorta: Secondary | ICD-10-CM | POA: Insufficient documentation

## 2021-01-11 DIAGNOSIS — G8929 Other chronic pain: Secondary | ICD-10-CM | POA: Insufficient documentation

## 2021-01-11 DIAGNOSIS — M25551 Pain in right hip: Secondary | ICD-10-CM | POA: Insufficient documentation

## 2021-01-11 DIAGNOSIS — D259 Leiomyoma of uterus, unspecified: Secondary | ICD-10-CM | POA: Insufficient documentation

## 2021-01-11 DIAGNOSIS — M5431 Sciatica, right side: Secondary | ICD-10-CM

## 2021-01-11 NOTE — Assessment & Plan Note (Signed)
Annual breast exam done by Dr Bary Castilla . Mammogram is up to date

## 2021-01-11 NOTE — Assessment & Plan Note (Signed)

## 2021-01-11 NOTE — Assessment & Plan Note (Addendum)
Noted on plain films of lumbar spine and reviewed with patient .  She is agreeable to changing to  high potency statin therapy for goal LDL  70

## 2021-01-11 NOTE — Assessment & Plan Note (Signed)
Appreciated on pelvic exam and symptomatic per patient.  Given history of fibroids and still possessing ovaries,  Will obtain pelvic ultrasound to evaluate and rule out ovarian mass

## 2021-01-11 NOTE — Assessment & Plan Note (Addendum)
With aortic atherosclerosis noted on lumbar spine films.  Changing therapy to atorvastatin ,  Goal LDL  70  Lab Results  Component Value Date   CHOL 157 01/06/2021   HDL 58.30 01/06/2021   LDLCALC 87 01/06/2021   LDLDIRECT 139.0 02/01/2016   TRIG 59.0 01/06/2021   CHOLHDL 3 01/06/2021   Lab Results  Component Value Date   ALT 20 01/06/2021   AST 19 01/06/2021   ALKPHOS 52 01/06/2021   BILITOT 0.5 01/06/2021

## 2021-01-11 NOTE — Assessment & Plan Note (Signed)
Improved control with hctz and losartan.   Lytes and GFR are unchanged.   Lab Results  Component Value Date   NA 140 01/06/2021   K 4.1 01/06/2021   CL 109 01/06/2021   CO2 26 01/06/2021   Lab Results  Component Value Date   CREATININE 0.81 01/06/2021

## 2021-01-11 NOTE — Assessment & Plan Note (Addendum)
Plain films done of lumbar spine note scoliosis and disk space narrowing from L3 to L5 .  Will recommend PT,  NSAID and tylenol.  If no improvement after 6 weeks,  MRI lumbar spine will be offered

## 2021-01-11 NOTE — Assessment & Plan Note (Signed)
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  PT referral recommended.

## 2021-01-12 ENCOUNTER — Telehealth: Payer: Self-pay

## 2021-01-12 NOTE — Telephone Encounter (Signed)
Called [facility] and spoke to [place]. Ebony Hail said that it was an insurance issue as [name] has just received a 90 day prescription for her medication. Ebony Hail states that the previous losartan and HCTZ was picked 01/07/21 and she needs to wait 90 days until her insurance will fill it again. Patient has been sent a mychart message explaining the situation.

## 2021-01-12 NOTE — Telephone Encounter (Signed)
Since patient picked up the [facility] pharmacy wil lnot take back is okto finish combo losartan/ HCTZ and then start separate medication.

## 2021-01-13 ENCOUNTER — Other Ambulatory Visit: Payer: Self-pay | Admitting: Internal Medicine

## 2021-01-13 ENCOUNTER — Other Ambulatory Visit: Payer: Self-pay

## 2021-01-13 ENCOUNTER — Ambulatory Visit
Admission: RE | Admit: 2021-01-13 | Discharge: 2021-01-13 | Disposition: A | Discharge status: Home / Self Care | Payer: Medicare HMO | Source: Ambulatory Visit | Attending: Internal Medicine | Admitting: Internal Medicine

## 2021-01-13 DIAGNOSIS — R140 Abdominal distension (gaseous): Secondary | ICD-10-CM

## 2021-01-13 DIAGNOSIS — N83201 Unspecified ovarian cyst, right side: Secondary | ICD-10-CM

## 2021-01-13 NOTE — Addendum Note (Signed)
Addended by: Crecencio Mc on: 01/13/2021 02:03 PM   Modules accepted: Orders

## 2021-01-18 ENCOUNTER — Other Ambulatory Visit (INDEPENDENT_AMBULATORY_CARE_PROVIDER_SITE_OTHER): Payer: Medicare HMO

## 2021-01-18 ENCOUNTER — Other Ambulatory Visit: Payer: Self-pay

## 2021-01-18 DIAGNOSIS — N83201 Unspecified ovarian cyst, right side: Secondary | ICD-10-CM | POA: Diagnosis not present

## 2021-01-19 ENCOUNTER — Ambulatory Visit: Payer: Medicare HMO | Attending: Internal Medicine

## 2021-01-19 DIAGNOSIS — M7601 Gluteal tendinitis, right hip: Secondary | ICD-10-CM

## 2021-01-19 DIAGNOSIS — G8929 Other chronic pain: Secondary | ICD-10-CM | POA: Insufficient documentation

## 2021-01-19 DIAGNOSIS — M545 Low back pain, unspecified: Secondary | ICD-10-CM | POA: Diagnosis not present

## 2021-01-19 LAB — CA 125: CA 125: 11 U/mL (ref <35)

## 2021-01-19 NOTE — Therapy (Signed)
[facility] [address], [place], [address] Phone: [phone]   Fax:  [phone]  Physical Therapy Evaluation  Patient Details  Name: Michele Fernandez MRN: [id number] Date of Birth: [birth year] Referring Provider (PT): Deborra Medina, MD   Encounter Date: 01/19/2021   PT End of Session - 01/19/21 1133    Visit Number 1    Number of Visits 17    Date for PT Re-Evaluation 03/16/21    Authorization Type [employer] Medicare    Authorization Time Period 01/19/21-04/13/21    PT Start Time 1030    PT Stop Time 1115    PT Time Calculation (min) 45 min    Activity Tolerance Patient tolerated treatment well    Behavior During Therapy [facility] for tasks assessed/performed           Past Medical History:  Diagnosis Date  . Cancer ([place])    skin  . GERD (gastroesophageal reflux disease)   . Hypertension   . Other sign and symptom in breast     Past Surgical History:  Procedure Laterality Date  . BREAST BIOPSY Bilateral 1993  . BREAST BIOPSY Left 1989  . CATARACT EXTRACTION W/PHACO Left 09/13/2015   Procedure: CATARACT EXTRACTION PHACO AND INTRAOCULAR LENS PLACEMENT (IOC);  Surgeon: Estill Cotta, MD;  Location: [facility] ORS;  Service: Ophthalmology;  Laterality: Left;  XVQ#[id number] H US:01:22.4 AP%:26.1 CDE:37.12  . CEREBRAL ANEURYSM REPAIR  1991  . COLONOSCOPY  2014   [facility] [place], benign colonic mucosa.  Marland Kitchen EYE SURGERY Right 11/2014  . FOOT SURGERY  2009, 2012  . [place]  2006  . UPPER GI ENDOSCOPY  2006    There were no vitals filed for this visit.    Subjective Assessment - 01/19/21 1032    Subjective Pt reports symptoms onset central low back at sacral junction after 5 minutes reading in bed in long sitting with referral pain from buttocks to entire lateral thight, mostly achiness, some mild sensation of muscle tightness. Pt also has these symptoms in the morning when getting up from  breakfast. Symptoms typically not a problem throughout the middle of day. Pt reports some pain/aberrancy with Right hip/leg when going up steps, worse after exercise. Pt exercises 4 times weekly with home-based workout videos.    Pertinent History 75yo physically active female, reports ~8 months exacerbation of central low back pain with some referral in Right buttock to lateral Right thigh. Symptoms brought on with prolonged sitting. No paresthesias, no weakness.    How long can you sit comfortably? 5 minutes    Diagnostic tests xrays of lumbar spine showing DJD    Patient Stated Goals Be able to read for longer periods without symptoms.    Currently in Pain? Yes    Pain Score --   1-2   Pain Location --   right buttocks area             [facility] PT Assessment - 01/19/21 0001      Assessment   Medical Diagnosis LBP with Rt leg radiation    Referring Provider (PT) Deborra Medina, MD    Onset Date/Surgical Date --   8-10 months ago   Prior Therapy none      Precautions   Precautions None      Balance Screen   Has the patient fallen in the past 6 months No    Has the patient had a decrease in activity level because of a  fear of falling?  No    Is the patient reluctant to leave their home because of a fear of falling?  No      Home Environment   Living Environment Private residence    Living Arrangements Spouse/significant other    Type of [place] to enter    Entrance Stairs-Number of Steps [place] One level      Prior Function   Level of [place] Retired      Observation/Other Assessments   Focus on Therapeutic Outcomes (FOTO)  78          Functional Tests:  5xSTS: 8.41sec, hands free; mild symptoms aggravation   SLS: Left: <5sec; Rt: <15 sec (aggravates symptoms) (dulled ankle strategy in trunk righting attempts)   Deep Tissue Palpation:  -tenderness at superior greater  trochanter, tenderness at lateral gluteus medius, tenderness and taut bands at vastus lateralis, none of this is familiar or symptomatic pain.  -no tenderness at the TFL, anterior hip joint.  -tenderness at posterior iliac fossa gluteals, not familiar or symptomatic -tenderness and tightness of Rt piriformis, consistent with symptoms, improved pain and muscle length with sustained release.  -no tenderness about the hamstrings, no frank tightness of gluteus maximus   Hip Screening:  -Seated FABER screening: ~10% difference in mobility, aggravated symptoms at piriformis area -Supine Hip ROM Screening; flexion WNL, moderately restricted and painful ER and IR both with posterior piriformis symptoms -External de-rotation test: positive Rt for symptoms exacerbation (test for gluteal tendinopathy) -Supine hip abduction MMT: 4+/5 bilat (subjective tightness of Right hip muscles, no pain) -SLS aggravating to symptoms (as above)  -Grind test for hip OA: mildly symptomatic posteriorly, confirmed not at SIJ -in prone, hip rotation limited by piriformis tightness   SIJ Screening:  -thigh thrust negative -sacral thrust negative  -no frank tenderness to palpation at SIJ Rt   Lumbar Screening: -PA testing: L5/S1 briefly stiff, then improved hypomobile; no symptoms; Mild hypermobility for age/gender at L1-L3, L4 mild hypermobility  -Slump test: normal neurodynamics bilat, symmetrical, sciatic distribution tightness, rather than symptomatic distribution.   Overground Gait Analysis: 21meters:  -mild abducted LLE, mild adducted RLE c slight [place]; grossly symmetrical without concerning abnormality.   Objective measurements completed on examination: See above findings.      PT Education - 01/19/21 1132    Education Details After examination: low likihood/concern of SIJ, hip OA, lumbar spine pathology.    Person(s) Educated Patient    Methods Explanation    Comprehension Verbalized understanding             PT Short Term Goals - 01/19/21 1147      PT SHORT TERM GOAL #1   Title After 4 weeks pt to demonstrate SLS >15 sec bilat without aggavation of Rt hip pain.    Baseline ~10sec Rt; ~3sec Left at evaluation    Time 4    Period Weeks    Status New    Target Date 02/16/21      PT SHORT TERM GOAL #2   Title After 4 weeks pt to report improved tolerance to seated reading in bed  >30 minutes without exacerbation of pain.    Baseline Pain after 5 minutes    Time 4    Period Weeks    Status New    Target Date 02/16/21      PT SHORT TERM GOAL #  3   Title After 4 weeks pt to reports no gross asymmetry subjective nor objective between BLE during climbing of stairs.    Baseline leg tightness with stairs    Time 4    Period Weeks    Status New    Target Date 02/16/21             PT Long Term Goals - 01/19/21 1157      PT LONG TERM GOAL #1   Title After 8 weeks pt to perform 30sec chair rise >14x without any symptoms exacerbation    Baseline At eval 5xSTS: 8sec    Time 8    Period Weeks    Status New    Target Date 03/16/21      PT LONG TERM GOAL #2   Title After 8 weeks pt to show FOTO score >85.    Baseline 78    Time 8    Period Weeks    Status New    Target Date 03/16/21                  Plan - 01/19/21 1135    Clinical Impression Statement Pt presenting to OPPT referred by PCP for low back pain with referral to lateral leg. D/d includes hip OA, lumbar radiculitis, SIJ dysfunction. Examination reveals negative slump test (low likelihood lumbar etiology), negative thigh thrust/sacral thrust (low liklihood SIJ), postiive pain with end range hip IR/ER, grind test (consistent with hip OA, however other factors make OA less likely). Pt has aggravtion of pain with seated FABER position, Rt SLS, Rt side external derotation test, tenderness about the greater trochanter at gluteal and piriformis attachments, and spastic piriformis. 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 Pt will benefit from skilled PT intervention to address strength and ROM impairment, as well as acitivty intolernance, to restore patient to baseline level of function in IADL, and leisure/fitness activity.    Personal Factors and Comorbidities Age;Education;Sex;Behavior Pattern;Fitness    Examination-Activity Limitations Dressing;Lift;Stairs    Examination-Participation Restrictions Cleaning;Yard Work    Stability/Clinical Decision Making Stable/Uncomplicated    Clinical Decision Making Moderate    Rehab Potential Excellent    PT Frequency 2x / week    PT Duration 12 weeks    PT Treatment/Interventions ADLs/Self Care Home Management;Electrical Stimulation;Moist Heat;Gait training;Therapeutic exercise;Therapeutic activities;Balance training;Patient/family education;Passive range of motion;Dry needling    PT Next Visit Plan Set up HEP, trial tennis ball relase of piriformis for home; avoid piriformis stretches as not to aggravate tendonosis    PT Home Exercise Plan deferred to visit 2    Consulted and Agree with Plan of Care Patient           Patient will benefit from skilled therapeutic intervention in order to improve the following deficits and impairments:  Decreased balance,Difficulty walking,Increased muscle spasms,Decreased range of motion,Decreased activity tolerance,Decreased strength  Visit Diagnosis: Chronic midline low back pain without sciatica  Gluteal tendinitis of right buttock     Problem List Patient Active Problem List   Diagnosis Date Noted  . Chronic right hip pain 01/11/2021  . Aortic atherosclerosis ([place]) 01/11/2021  . Abdominal fullness in suprapubic region 01/11/2021  . Allergy history, drug 08/22/2020  . Vertigo 02/09/2019  . Tinnitus aurium, unspecified laterality 07/03/2018  . Encounter for general adult medical examination with abnormal findings 07/31/2016  . GERD  (gastroesophageal reflux disease) 03/22/2016  . Hyperlipidemia 02/03/2016  . Breast cyst 08/26/2015  . Medicare annual  wellness visit, subsequent 07/14/2015  . Overweight (BMI 25.0-29.9) 10/06/2014  . 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 Chronic sciatica, right 10/06/2013  . History of breast lump/mass excision 08/05/2013  . Hypertension 12/10/2012  . Osteoporosis 12/10/2012   12:19 PM, 01/19/21 Etta Grandchild, PT, DPT Physical Therapist - [place] [phone] (Office)  Suriah Peragine C 01/19/2021, 12:19 PM  [facility] [address], [place], [address] Phone: [phone]   Fax:  [phone]  Name: ATHIRA JANOWICZ MRN: [id number] Date of Birth: Apr 17, 1946

## 2021-01-20 ENCOUNTER — Encounter: Payer: Medicare HMO | Admitting: Physical Therapy

## 2021-01-25 ENCOUNTER — Ambulatory Visit: Payer: Medicare HMO

## 2021-01-25 ENCOUNTER — Other Ambulatory Visit: Payer: Self-pay

## 2021-01-25 DIAGNOSIS — M545 Low back pain, unspecified: Secondary | ICD-10-CM | POA: Diagnosis not present

## 2021-01-25 DIAGNOSIS — G8929 Other chronic pain: Secondary | ICD-10-CM

## 2021-01-25 DIAGNOSIS — M7601 Gluteal tendinitis, right hip: Secondary | ICD-10-CM

## 2021-01-25 NOTE — Therapy (Signed)
[facility] [address], [place], [address] Phone: [phone]   Fax:  [phone]  Physical Therapy Treatment  Patient Details  Name: Michele Fernandez MRN: [id number] Date of Birth: Oct 26, 1946 Referring Provider (PT): Deborra Medina, MD   Encounter Date: 01/25/2021   PT End of Session - 01/25/21 1047    Visit Number 2    Number of Visits 17    Date for PT Re-Evaluation 03/16/21    Authorization Type [employer]na Medicare    Authorization Time Period 01/19/21-04/13/21    PT Start Time 0900    PT Stop Time 0945    PT Time Calculation (min) 45 min    Activity Tolerance Patient tolerated treatment well    Behavior During Therapy [facility] for tasks assessed/performed           Past Medical History:  Diagnosis Date  . Cancer ([place])    skin  . GERD (gastroesophageal reflux disease)   . Hypertension   . Other sign and symptom in breast     Past Surgical History:  Procedure Laterality Date  . BREAST BIOPSY Bilateral 1993  . BREAST BIOPSY Left 1989  . CATARACT EXTRACTION W/PHACO Left 09/13/2015   Procedure: CATARACT EXTRACTION PHACO AND INTRAOCULAR LENS PLACEMENT (IOC);  Surgeon: Estill Cotta, MD;  Location: [facility] ORS;  Service: Ophthalmology;  Laterality: Left;  ZDG#[id number] H US:01:22.4 AP%:26.1 CDE:37.12  . CEREBRAL ANEURYSM REPAIR  1991  . COLONOSCOPY  2014   [facility] [place], benign colonic mucosa.  Marland Kitchen EYE SURGERY Right 11/2014  . FOOT SURGERY  2009, 2012  . [place]  2006  . UPPER GI ENDOSCOPY  2006    There were no vitals filed for this visit.   Subjective Assessment - 01/25/21 0856    Subjective Pt reports that the following activities bother her hip: stepping up onto R LE on her stairs (sometimes she has to pull herself up with railing), standing on L LE and crossing R LE over thigh (figure 4) to put on socks/shoes; the following activities bother her lower back: sitting propped in bed  reading at night, and she also feels worse first thing in the morning- low back gradually feels better as morning progresses.    Pertinent History 75yo physically active female, reports ~8 months exacerbation of central low back pain with some referral in Right buttock to lateral Right thigh. Symptoms brought on with prolonged sitting. No paresthesias, no weakness.    How long can you sit comfortably? 5 minutes    Diagnostic tests xrays of lumbar spine showing DJD    Patient Stated Goals Be able to read for longer periods without symptoms.           Treatment Today:  Concordant R hip pain reproduced with standing figure 4 (standing on LLE to reach R foot) R hip ER limited 25% compared to L today (supine PROM) (+) TTP/tight R piriformis, glute med, glute max  Manual tx: R hip jt mob (ant glide): Gr 2-3, 30 sec intervals x 4 R innominate mob (post rot): Gr 2-3, 30 sec intervals x4 STM R piriformis, glute med, glute max x 10 min  Re-checked standing figure 4 and L hip ER PROM: both improved post-manual tx   Therapeutic Exercises: -supine hip ER in hooklying with isometric holds x 3 sec (blue band around thighs) x20, pt notes R side "fatiguing" compared to L -supine glute squeeze + bridge (with blue band around thighs) x  20 -standing hip hinge (mini squat) with table behind her, emphasized glute squeeze on concentric phase of movement, pt would benefit from more practice with this movement pattern (Pt instructed on these 3 exercises for HEP; PT unable to access medbridge to print handout today)  Plan to address lumbar spine impairments at future visits, cont progressing hip strengthening   PT Education - 01/25/21 1047    Education Details exercise form/technique    Person(s) Educated Patient    Methods Explanation    Comprehension Verbalized understanding;Returned demonstration            PT Short Term Goals - 01/19/21 1147      PT SHORT TERM GOAL #1   Title After 4 weeks pt  to demonstrate SLS >15 sec bilat without aggavation of Rt hip pain.    Baseline ~10sec Rt; ~3sec Left at evaluation    Time 4    Period Weeks    Status New    Target Date 02/16/21      PT SHORT TERM GOAL #2   Title After 4 weeks pt to report improved tolerance to seated reading in bed  >30 minutes without exacerbation of pain.    Baseline Pain after 5 minutes    Time 4    Period Weeks    Status New    Target Date 02/16/21      PT SHORT TERM GOAL #3   Title After 4 weeks pt to reports no gross asymmetry subjective nor objective between BLE during climbing of stairs.    Baseline leg tightness with stairs    Time 4    Period Weeks    Status New    Target Date 02/16/21             PT Long Term Goals - 01/19/21 1157      PT LONG TERM GOAL #1   Title After 8 weeks pt to perform 30sec chair rise >14x without any symptoms exacerbation    Baseline At eval 5xSTS: 8sec    Time 8    Period Weeks    Status New    Target Date 03/16/21      PT LONG TERM GOAL #2   Title After 8 weeks pt to show FOTO score >85.    Baseline 78    Time 8    Period Weeks    Status New    Target Date 03/16/21                 Plan - 01/25/21 1048    Clinical Impression Statement PT focused tx session today on R hip impairments; she responded well to manual therapy techniques for R hip as she was able to perform functional R step up and also standing on L LE figure 4 (simulating putting R shoes on standing) with report of improved R hip sx's after tx.  Pt's R hip ER AROM also improved post-tx.  She was able to peform gluteal muscle retraining exercises today and reports asymmetric fatigue R>L post exercises.    Personal Factors and Comorbidities Age;Education;Sex;Behavior Pattern;Fitness    Examination-Activity Limitations Dressing;Lift;Stairs    Examination-Participation Restrictions Cleaning;Yard Work    Stability/Clinical Decision Making Stable/Uncomplicated    Rehab Potential Excellent     PT Frequency 2x / week    PT Duration 12 weeks    PT Treatment/Interventions ADLs/Self Care Home Management;Electrical Stimulation;Moist Heat;Gait training;Therapeutic exercise;Therapeutic activities;Balance training;Patient/family education;Passive range of motion;Dry needling    PT Next Visit Plan Set up  HEP, trial tennis ball relase of piriformis for home; avoid piriformis stretches as not to aggravate tendonosis    PT Home Exercise Plan deferred to visit 2    Consulted and Agree with Plan of Care Patient           Patient will benefit from skilled therapeutic intervention in order to improve the following deficits and impairments:  Decreased balance,Difficulty walking,Increased muscle spasms,Decreased range of motion,Decreased activity tolerance,Decreased strength  Visit Diagnosis: Chronic midline low back pain without sciatica  Gluteal tendinitis of right buttock     Problem List Patient Active Problem List   Diagnosis Date Noted  . Chronic right hip pain 01/11/2021  . Aortic atherosclerosis ([place]) 01/11/2021  . Abdominal fullness in suprapubic region 01/11/2021  . Allergy history, drug 08/22/2020  . Vertigo 02/09/2019  . Tinnitus aurium, unspecified laterality 07/03/2018  . Encounter for general adult medical examination with abnormal findings 07/31/2016  . GERD (gastroesophageal reflux disease) 03/22/2016  . Hyperlipidemia 02/03/2016  . Breast cyst 08/26/2015  . Medicare annual wellness visit, subsequent 07/14/2015  . Overweight (BMI 25.0-29.9) 10/06/2014  . 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 Chronic sciatica, right 10/06/2013  . History of breast lump/mass excision 08/05/2013  . Hypertension 12/10/2012  . Osteoporosis 12/10/2012    Pincus Badder 01/25/2021, 12:28 PM Merdis Delay, PT, DPT Physical Therapist - [facility] [address], [place], [address] Phone:  [phone]   Fax:  [phone]  Name: Michele Fernandez MRN: [id number] Date of Birth: Mar 31, 1946

## 2021-01-31 ENCOUNTER — Other Ambulatory Visit: Payer: Self-pay

## 2021-01-31 ENCOUNTER — Ambulatory Visit: Payer: Medicare HMO | Admitting: Physical Therapy

## 2021-01-31 ENCOUNTER — Encounter: Payer: Self-pay | Admitting: Physical Therapy

## 2021-01-31 DIAGNOSIS — G8929 Other chronic pain: Secondary | ICD-10-CM

## 2021-01-31 DIAGNOSIS — M7601 Gluteal tendinitis, right hip: Secondary | ICD-10-CM

## 2021-01-31 DIAGNOSIS — M545 Low back pain, unspecified: Secondary | ICD-10-CM | POA: Diagnosis not present

## 2021-01-31 NOTE — Therapy (Signed)
[facility] [address] [place], [address] Phone: [phone]   Fax:  [phone]  Physical Therapy Treatment  Patient Details  Name: Michele Fernandez MRN: [id number] Date of Birth: 12-Mar-1946 Referring Provider (PT): Deborra Medina, MD   Encounter Date: 01/31/2021   PT End of Session - 01/31/21 1819    Visit Number 3    Number of Visits 17    Date for PT Re-Evaluation 04/12/21    Authorization Type Aetna Medicare    Authorization Time Period [employer] Medicare reporting period from 01/19/21    PT Start Time 1515    PT Stop Time 1555    PT Time Calculation (min) 40 min    Activity Tolerance Patient tolerated treatment well    Behavior During Therapy [facility] for tasks assessed/performed           Past Medical History:  Diagnosis Date  . Cancer ([place])    skin  . GERD (gastroesophageal reflux disease)   . Hypertension   . Other sign and symptom in breast     Past Surgical History:  Procedure Laterality Date  . BREAST BIOPSY Bilateral 1993  . BREAST BIOPSY Left 1989  . CATARACT EXTRACTION W/PHACO Left 09/13/2015   Procedure: CATARACT EXTRACTION PHACO AND INTRAOCULAR LENS PLACEMENT (IOC);  Surgeon: Estill Cotta, MD;  Location: [facility] ORS;  Service: Ophthalmology;  Laterality: Left;  ZLD#[id number] H US:01:22.4 AP%:26.1 CDE:37.12  . CEREBRAL ANEURYSM REPAIR  1991  . COLONOSCOPY  2014   [facility], benign colonic mucosa.  Marland Kitchen EYE SURGERY Right 11/2014  . FOOT SURGERY  2009, 2012  . [place]  2006  . UPPER GI ENDOSCOPY  2006    There were no vitals filed for this visit.   Subjective Assessment - 01/31/21 1516    Subjective Patient reports no pain upon arrival. She states she did her HEP from [place] from last treatment session but did not do her usual aerobic exercises because she was was tired and her back hurt. She mopped and vacuumed today. States she had some pain on the left low  back upon complete release from bridge exercise that [place] gave her. Reports it did not continue to hurt after the exercises were over. She does feel that PT is helping so far. R figure 4 stretch in standing is better and she can get her socks on easier now.    Pertinent History 75yo physically active female, reports ~8 months exacerbation of central low back pain with some referral in Right buttock to lateral Right thigh. Symptoms brought on with prolonged sitting. No paresthesias, no weakness.    How long can you sit comfortably? 5 minutes    Diagnostic tests xrays of lumbar spine showing DJD    Patient Stated Goals Be able to read for longer periods without symptoms.    Currently in Pain? No/denies           No history of spinal surgery  TREATMENT:   Concordant R hip pain reproduced but better with standing figure 4 (standing on RLE to reach L foot) R hip ER not limited compared to L today (supine PROM) (+) TTP/tight R piriformis, glute med, glute max  Manual therapy: to reduce pain and tissue tension, improve range of motion, neuromodulation, in order to promote improved ability to complete functional activities. Prone CPA and R UPA to lumbar spine grade III-IV at L5 Prone STM to B lumbar paraspinals (tight).  R hip  jt mob (ant glide in prone): Gr 2-3, 30 sec intervals x 4 R innominate mob (post rot): Gr 2-3, 30 sec intervals x4 STM R piriformis, glute med, glute max x 10 min  Re-checked standing figure 4 and L hip ER PROM: both mildly improved post-manual tx   Therapeutic exercise: to centralize symptoms and improve ROM, strength, muscular endurance, and activity tolerance required for successful completion of functional activities.  - lumbar extension in lying x 15 (no pain in the low back or glutes).  -supine hip ER in hooklying with isometric holds x 3 sec (blue band around thighs) x20, pt notes R side "fatiguing" compared to L  -supine glute squeeze + bridge (with blue  band around thighs) x 20 (cuing for improve glute squeeze and core control).  -standing abducted squat with counter in front for intermittant B UE support. 2x20.  emphasized glute squeeze on concentric phase of movement - Education on HEP including handout   Plan to continue addressing lumbar spine impairments at future visits, cont progressing hip strengthening   HOME EXERCISE PROGRAM  Access Code: [email or web address] URL: [email or web address] Date: 01/31/2021 Prepared by: Rosita Kea  Exercises Hooklying Clamshell with Resistance - 1 x daily - 3 sets - 20 reps - 3 seconds hold Supine Bridge with Resistance Band - 1 x daily - 3 sets - 20 reps - 3 seconds hold Squat with Chair Touch - 1 x daily - 3 sets - 20 reps    PT Education - 01/31/21 1819    Education Details exercise form/technique    Person(s) Educated Patient    Methods Explanation;Demonstration;Tactile cues;Verbal cues;Handout    Comprehension Verbalized understanding;Returned demonstration;Verbal cues required;Tactile cues required;Need further instruction            PT Short Term Goals - 01/19/21 1147      PT SHORT TERM GOAL #1   Title After 4 weeks pt to demonstrate SLS >15 sec bilat without aggavation of Rt hip pain.    Baseline ~10sec Rt; ~3sec Left at evaluation    Time 4    Period Weeks    Status New    Target Date 02/16/21      PT SHORT TERM GOAL #2   Title After 4 weeks pt to report improved tolerance to seated reading in bed  >30 minutes without exacerbation of pain.    Baseline Pain after 5 minutes    Time 4    Period Weeks    Status New    Target Date 02/16/21      PT SHORT TERM GOAL #3   Title After 4 weeks pt to reports no gross asymmetry subjective nor objective between BLE during climbing of stairs.    Baseline leg tightness with stairs    Time 4    Period Weeks    Status New    Target Date 02/16/21             PT Long Term Goals - 01/19/21 1157      PT LONG TERM GOAL #1    Title After 8 weeks pt to perform 30sec chair rise >14x without any symptoms exacerbation    Baseline At eval 5xSTS: 8sec    Time 8    Period Weeks    Status New    Target Date 03/16/21      PT LONG TERM GOAL #2   Title After 8 weeks pt to show FOTO score >85.    Baseline 78    Time 8  Period Weeks    Status New    Target Date 03/16/21                 Plan - 01/31/21 1826    Clinical Impression Statement Patient reports feeling better by end of session. Continued with manual therapy addressing R hip and additional interventions for low back. Improved core control with bridges using cuing. Does have concordant pain in the low back with CPA to L5. Recommended trial of lumbar roll while reading in bed. Patient would benefit from continued management of limiting condition by skilled physical therapist to address remaining impairments and functional limitations to work towards stated goals and return to PLOF or maximal functional independence.    Personal Factors and Comorbidities Age;Education;Sex;Behavior Pattern;Fitness    Examination-Activity Limitations Dressing;Lift;Stairs    Examination-Participation Restrictions Cleaning;Yard Work    Stability/Clinical Decision Making Stable/Uncomplicated    Rehab Potential Excellent    PT Frequency 2x / week    PT Duration 12 weeks    PT Treatment/Interventions ADLs/Self Care Home Management;Electrical Stimulation;Moist Heat;Gait training;Therapeutic exercise;Therapeutic activities;Balance training;Patient/family education;Passive range of motion;Dry needling    PT Next Visit Plan trial tennis ball relase of piriformis for home; avoid piriformis stretches as not to aggravate tendonosis    PT Home Exercise Plan Medbridge Access Code: [email or web address]    Consulted and Agree with Plan of Care Patient           Patient will benefit from skilled therapeutic intervention in order to improve the following deficits and impairments:  Decreased  balance,Difficulty walking,Increased muscle spasms,Decreased range of motion,Decreased activity tolerance,Decreased strength  Visit Diagnosis: Chronic midline low back pain without sciatica  Gluteal tendinitis of right buttock     Problem List Patient Active Problem List   Diagnosis Date Noted  . Chronic right hip pain 01/11/2021  . Aortic atherosclerosis ([place]) 01/11/2021  . Abdominal fullness in suprapubic region 01/11/2021  . Allergy history, drug 08/22/2020  . Vertigo 02/09/2019  . Tinnitus aurium, unspecified laterality 07/03/2018  . Encounter for general adult medical examination with abnormal findings 07/31/2016  . GERD (gastroesophageal reflux disease) 03/22/2016  . Hyperlipidemia 02/03/2016  . Breast cyst 08/26/2015  . Medicare annual wellness visit, subsequent 07/14/2015  . Overweight (BMI 25.0-29.9) 10/06/2014  . 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 Chronic sciatica, right 10/06/2013  . History of breast lump/mass excision 08/05/2013  . Hypertension 12/10/2012  . Osteoporosis 12/10/2012    Everlean Alstrom. Graylon Good, PT, DPT 01/31/21, 6:29 PM  [facility] [address] [place], [address] Phone: [phone]   Fax:  [phone]  Name: Michele Fernandez MRN: [id number] Date of Birth: 1946-06-22

## 2021-02-01 MED ORDER — FAMOTIDINE 20 MG PO TABS: 20.0000 mg | ORAL_TABLET | Freq: Every day | ORAL | 1 refills | Status: DC

## 2021-02-02 ENCOUNTER — Other Ambulatory Visit: Payer: Self-pay

## 2021-02-02 ENCOUNTER — Ambulatory Visit: Payer: Medicare HMO | Attending: Internal Medicine | Admitting: Physical Therapy

## 2021-02-02 DIAGNOSIS — G8929 Other chronic pain: Secondary | ICD-10-CM | POA: Diagnosis present

## 2021-02-02 DIAGNOSIS — M7601 Gluteal tendinitis, right hip: Secondary | ICD-10-CM | POA: Diagnosis present

## 2021-02-02 DIAGNOSIS — M545 Low back pain, unspecified: Secondary | ICD-10-CM | POA: Diagnosis not present

## 2021-02-02 NOTE — Therapy (Signed)
[facility] [address], [place], [address] Phone: [phone]   Fax:  [phone]  Physical Therapy Treatment  Patient Details  Name: Michele Fernandez MRN: [id number] Date of Birth: 21-Jan-1946 Referring Provider (PT): Deborra Medina, MD   Encounter Date: 02/02/2021   PT End of Session - 02/02/21 1434    Visit Number 4    Number of Visits 17    Date for PT Re-Evaluation 04/12/21    Authorization Type Aetna Medicare    Authorization Time Period [employer] Medicare reporting period from 01/19/21    PT Start Time 1425    PT Stop Time 1510    PT Time Calculation (min) 45 min    Activity Tolerance Patient tolerated treatment well    Behavior During Therapy [facility] for tasks assessed/performed           Past Medical History:  Diagnosis Date  . Cancer ([place])    skin  . GERD (gastroesophageal reflux disease)   . Hypertension   . Other sign and symptom in breast     Past Surgical History:  Procedure Laterality Date  . BREAST BIOPSY Bilateral 1993  . BREAST BIOPSY Left 1989  . CATARACT EXTRACTION W/PHACO Left 09/13/2015   Procedure: CATARACT EXTRACTION PHACO AND INTRAOCULAR LENS PLACEMENT (IOC);  Surgeon: Estill Cotta, MD;  Location: [facility] ORS;  Service: Ophthalmology;  Laterality: Left;  UQJ#[id number] H US:01:22.4 AP%:26.1 CDE:37.12  . CEREBRAL ANEURYSM REPAIR  1991  . COLONOSCOPY  2014   [facility], benign colonic mucosa.  Marland Kitchen EYE SURGERY Right 11/2014  . FOOT SURGERY  2009, 2012  . [place]  2006  . UPPER GI ENDOSCOPY  2006    There were no vitals filed for this visit.   Subjective Assessment - 02/02/21 1429    Subjective Patient reports she has no pain upon arrival but was awoken by pain in the right hip last night and had some trouble going to sleep again. It was early morning. She did try a lumbar roll while reading the night before and it helped.    Pertinent History 75yo  physically active female, reports ~8 months exacerbation of central low back pain with some referral in Right buttock to lateral Right thigh. Symptoms brought on with prolonged sitting. No paresthesias, no weakness.    How long can you sit comfortably? 5 minutes    Diagnostic tests xrays of lumbar spine showing DJD    Patient Stated Goals Be able to read for longer periods without symptoms.    Currently in Pain? No/denies           No history of spinal surgery  TREATMENT:   Concordant R hip pain reproduced but better with standing figure 4 (standing on RLE to reach L foot) R hip ER not limited compared to L today (supine PROM) (+)  Mild TTP/tight R piriformis, glute med, glute max   Manual therapy: to reduce pain and tissue tension, improve range of motion, neuromodulation, in order to promote improved ability to complete functional activities. - Prone CPA and R UPA to lumbar spine with KE wedge, grade III-IV at L5 - STM R piriformis, glute med, glute max, lumbar paraspinals.   Therapeutic exercise:to centralize symptoms and improve ROM, strength, muscular endurance, and activity tolerance required for successful completion of functional activities.  - standing lumbar extension against plinth, 1x10 -supine glute squeeze + bridge (with black band around thighs) x 20 (cuing for improve glute  squeeze and core control).  -supine hip ER in hooklying with isometric holds x 3 sec (black band around thighs) x20,  - eccentric step up (up lead with L LE, down lead with R LE slowly), 3x10 to 9 inch step with touchdown UE support as needed.   Plan to continue addressing lumbar spine impairments at future visits, cont progressing hip strengthening   HOME EXERCISE PROGRAM  Access Code: [email or web address] URL: [email or web address] Date: 01/31/2021 Prepared by: Rosita Kea  Exercises Hooklying Clamshell with Resistance - 1 x daily - 3 sets - 20 reps - 3 seconds hold Supine Bridge  with Resistance Band - 1 x daily - 3 sets - 20 reps - 3 seconds hold Squat with Chair Touch - 1 x daily - 3 sets - 20 reps    PT Education - 02/02/21 1434    Education Details exercise form/technique    Person(s) Educated Patient    Methods Explanation;Demonstration;Tactile cues;Verbal cues    Comprehension Verbalized understanding;Returned demonstration;Verbal cues required;Tactile cues required;Need further instruction            PT Short Term Goals - 01/19/21 1147      PT SHORT TERM GOAL #1   Title After 4 weeks pt to demonstrate SLS >15 sec bilat without aggavation of Rt hip pain.    Baseline ~10sec Rt; ~3sec Left at evaluation    Time 4    Period Weeks    Status New    Target Date 02/16/21      PT SHORT TERM GOAL #2   Title After 4 weeks pt to report improved tolerance to seated reading in bed  >30 minutes without exacerbation of pain.    Baseline Pain after 5 minutes    Time 4    Period Weeks    Status New    Target Date 02/16/21      PT SHORT TERM GOAL #3   Title After 4 weeks pt to reports no gross asymmetry subjective nor objective between BLE during climbing of stairs.    Baseline leg tightness with stairs    Time 4    Period Weeks    Status New    Target Date 02/16/21             PT Long Term Goals - 01/19/21 1157      PT LONG TERM GOAL #1   Title After 8 weeks pt to perform 30sec chair rise >14x without any symptoms exacerbation    Baseline At eval 5xSTS: 8sec    Time 8    Period Weeks    Status New    Target Date 03/16/21      PT LONG TERM GOAL #2   Title After 8 weeks pt to show FOTO score >85.    Baseline 78    Time 8    Period Weeks    Status New    Target Date 03/16/21                 Plan - 02/02/21 1509    Clinical Impression Statement Patient tolerated treatment well overall and continues to show improved activity tolerance and hip ROM. Is still more sensitive and tight in the right gluteal region and right low back. Would  benefit from single leg balance next session. Patient would benefit from continued management of limiting condition by skilled physical therapist to address remaining impairments and functional limitations to work towards stated goals and return to PLOF or maximal functional independence.  Personal Factors and Comorbidities Age;Education;Sex;Behavior Pattern;Fitness    Examination-Activity Limitations Dressing;Lift;Stairs    Examination-Participation Restrictions Cleaning;Yard Work    Stability/Clinical Decision Making Stable/Uncomplicated    Rehab Potential Excellent    PT Frequency 2x / week    PT Duration 12 weeks    PT Treatment/Interventions ADLs/Self Care Home Management;Electrical Stimulation;Moist Heat;Gait training;Therapeutic exercise;Therapeutic activities;Balance training;Patient/family education;Passive range of motion;Dry needling    PT Next Visit Plan trial tennis ball relase of piriformis for home; avoid piriformis stretches as not to aggravate tendonosis    PT Home Exercise Plan Medbridge Access Code: [email or web address]    Consulted and Agree with Plan of Care Patient           Patient will benefit from skilled therapeutic intervention in order to improve the following deficits and impairments:  Decreased balance,Difficulty walking,Increased muscle spasms,Decreased range of motion,Decreased activity tolerance,Decreased strength  Visit Diagnosis: Chronic midline low back pain without sciatica  Gluteal tendinitis of right buttock     Problem List Patient Active Problem List   Diagnosis Date Noted  . Chronic right hip pain 01/11/2021  . Aortic atherosclerosis ([place]) 01/11/2021  . Abdominal fullness in suprapubic region 01/11/2021  . Allergy history, drug 08/22/2020  . Vertigo 02/09/2019  . Tinnitus aurium, unspecified laterality 07/03/2018  . Encounter for general adult medical examination with abnormal findings 07/31/2016  . GERD (gastroesophageal reflux disease) 03/22/2016   . Hyperlipidemia 02/03/2016  . Breast cyst 08/26/2015  . Medicare annual wellness visit, subsequent 07/14/2015  . Overweight (BMI 25.0-29.9) 10/06/2014  . 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 Chronic sciatica, right 10/06/2013  . History of breast lump/mass excision 08/05/2013  . Hypertension 12/10/2012  . Osteoporosis 12/10/2012    Everlean Alstrom. Graylon Good, PT, DPT 02/02/21, 3:09 PM  [facility] [address] [place], [address] Phone: [phone]   Fax:  [phone]  Name: Michele Fernandez MRN: [id number] Date of Birth: 06/20/1946

## 2021-02-07 ENCOUNTER — Encounter: Payer: Self-pay | Admitting: Physical Therapy

## 2021-02-07 ENCOUNTER — Ambulatory Visit: Payer: Medicare HMO | Admitting: Physical Therapy

## 2021-02-07 ENCOUNTER — Other Ambulatory Visit: Payer: Self-pay

## 2021-02-07 DIAGNOSIS — M545 Low back pain, unspecified: Secondary | ICD-10-CM | POA: Diagnosis not present

## 2021-02-07 DIAGNOSIS — G8929 Other chronic pain: Secondary | ICD-10-CM

## 2021-02-07 DIAGNOSIS — M7601 Gluteal tendinitis, right hip: Secondary | ICD-10-CM

## 2021-02-07 NOTE — Therapy (Signed)
[facility] [address] [place], [address] Phone: [phone]   Fax:  [phone]  Physical Therapy Treatment  Patient Details  Name: Michele Fernandez MRN: [id number] Date of Birth: Sep 09, 1946 Referring Provider (PT): Deborra Medina, MD   Encounter Date: 02/07/2021   PT End of Session - 02/07/21 1713    Visit Number 5    Number of Visits 17    Date for PT Re-Evaluation 04/12/21    Authorization Type Aetna Medicare    Authorization Time Period [employer] Medicare reporting period from 01/19/21    PT Start Time 1647    PT Stop Time 1725    PT Time Calculation (min) 38 min    Activity Tolerance Patient tolerated treatment well    Behavior During Therapy [facility] for tasks assessed/performed           Past Medical History:  Diagnosis Date  . Cancer ([place])    skin  . GERD (gastroesophageal reflux disease)   . Hypertension   . Other sign and symptom in breast     Past Surgical History:  Procedure Laterality Date  . BREAST BIOPSY Bilateral 1993  . BREAST BIOPSY Left 1989  . CATARACT EXTRACTION W/PHACO Left 09/13/2015   Procedure: CATARACT EXTRACTION PHACO AND INTRAOCULAR LENS PLACEMENT (IOC);  Surgeon: Estill Cotta, MD;  Location: [facility] ORS;  Service: Ophthalmology;  Laterality: Left;  BTD#[id number] H US:01:22.4 AP%:26.1 CDE:37.12  . CEREBRAL ANEURYSM REPAIR  1991  . COLONOSCOPY  2014   [facility], benign colonic mucosa.  Marland Kitchen EYE SURGERY Right 11/2014  . FOOT SURGERY  2009, 2012  . [place]  2006  . UPPER GI ENDOSCOPY  2006    There were no vitals filed for this visit.   Subjective Assessment - 02/07/21 1646    Subjective Patient reports she has no pain upon arrival and last had pain yesterday when laying in bed. Reports overall hip is feeling better. Was a bit sore following last treatment session. Putting on shoes and socks has been getting better. Did some exercises (not HEP) this  morning and she felt some discomfort in the R hip with a lot of SLS on R.    Pertinent History 75yo physically active female, reports ~8 months exacerbation of central low back pain with some referral in Right buttock to lateral Right thigh. Symptoms brought on with prolonged sitting. No paresthesias, no weakness.    How long can you sit comfortably? 10-15 minutes in bed    Diagnostic tests xrays of lumbar spine showing DJD    Patient Stated Goals Be able to read for longer periods without symptoms.    Currently in Pain? No/denies             No history of spinal surgery  TREATMENT:   Manual therapy:to reduce pain and tissue tension, improve range of motion, neuromodulation, in order to promote improved ability to complete functional activities. - Prone CPA and R UPA to lumbar spine with KE wedge, grade III-IV at L5 - STM R piriformis, glute med, glute max, lumbar paraspinals.   Therapeutic exercise:to centralize symptoms and improve ROM, strength, muscular endurance, and activity tolerance required for successful completion of functional activities. - standing lumbar extension against plinth, 1x10 - prone multifidus kick (abdominal brace with alternating hip extension), 2x10 each side -supine hip ER in hooklying with isometric holds x 3 sec (black band around thighs) x20,  -supine glute squeeze + bridge (with black  band around thighs) x 25(cuing for improve glute squeeze and core control). - eccentric step up (up lead with L LE, down lead with R LE slowly), 3x10 to 9 inch step with touchdown UE support as needed.  -standing abducted squat. 1x20.  emphasized glute squeeze on concentric phase of movement  Plan tocontinueaddressinglumbar spine impairments at future visits, cont progressing hip strengthening   HOME EXERCISE PROGRAM Access Code: [email or web address] URL: [email or web address] Date: 01/31/2021 Prepared by: Rosita Kea  Exercises Hooklying Clamshell  with Resistance - 1 x daily - 3 sets - 20 reps - 3 seconds hold Supine Bridge with Resistance Band - 1 x daily - 3 sets - 20 reps - 3 seconds hold Squat with Chair Touch - 1 x daily - 3 sets - 20 reps    PT Education - 02/07/21 1648    Education Details exercise form/technique    Person(s) Educated Patient    Methods Explanation;Demonstration;Tactile cues;Verbal cues    Comprehension Verbalized understanding;Returned demonstration;Verbal cues required;Tactile cues required;Need further instruction            PT Short Term Goals - 01/19/21 1147      PT SHORT TERM GOAL #1   Title After 4 weeks pt to demonstrate SLS >15 sec bilat without aggavation of Rt hip pain.    Baseline ~10sec Rt; ~3sec Left at evaluation    Time 4    Period Weeks    Status New    Target Date 02/16/21      PT SHORT TERM GOAL #2   Title After 4 weeks pt to report improved tolerance to seated reading in bed  >30 minutes without exacerbation of pain.    Baseline Pain after 5 minutes    Time 4    Period Weeks    Status New    Target Date 02/16/21      PT SHORT TERM GOAL #3   Title After 4 weeks pt to reports no gross asymmetry subjective nor objective between BLE during climbing of stairs.    Baseline leg tightness with stairs    Time 4    Period Weeks    Status New    Target Date 02/16/21             PT Long Term Goals - 01/19/21 1157      PT LONG TERM GOAL #1   Title After 8 weeks pt to perform 30sec chair rise >14x without any symptoms exacerbation    Baseline At eval 5xSTS: 8sec    Time 8    Period Weeks    Status New    Target Date 03/16/21      PT LONG TERM GOAL #2   Title After 8 weeks pt to show FOTO score >85.    Baseline 78    Time 8    Period Weeks    Status New    Target Date 03/16/21                 Plan - 02/07/21 1747    Clinical Impression Statement Patient tolerated treatment well and continues to make progress towards goals. Continued with similar exercises to  last session due to good results. Patient would benefit from continued management of limiting condition by skilled physical therapist to address remaining impairments and functional limitations to work towards stated goals and return to PLOF or maximal functional independence    Personal Factors and Comorbidities Age;Education;Sex;Behavior Pattern;Fitness    Examination-Activity Limitations Dressing;Lift;Stairs    Examination-Participation Restrictions  Cleaning;Yard Work    Stability/Clinical Decision Making Stable/Uncomplicated    Rehab Potential Excellent    PT Frequency 2x / week    PT Duration 12 weeks    PT Treatment/Interventions ADLs/Self Care Home Management;Electrical Stimulation;Moist Heat;Gait training;Therapeutic exercise;Therapeutic activities;Balance training;Patient/family education;Passive range of motion;Dry needling    PT Next Visit Plan trial tennis ball relase of piriformis for home; avoid piriformis stretches as not to aggravate tendonosis    PT Home Exercise Plan Medbridge Access Code: [email or web address]    Consulted and Agree with Plan of Care Patient           Patient will benefit from skilled therapeutic intervention in order to improve the following deficits and impairments:  Decreased balance,Difficulty walking,Increased muscle spasms,Decreased range of motion,Decreased activity tolerance,Decreased strength  Visit Diagnosis: Chronic midline low back pain without sciatica  Gluteal tendinitis of right buttock     Problem List Patient Active Problem List   Diagnosis Date Noted  . Chronic right hip pain 01/11/2021  . Aortic atherosclerosis ([place]) 01/11/2021  . Abdominal fullness in suprapubic region 01/11/2021  . Allergy history, drug 08/22/2020  . Vertigo 02/09/2019  . Tinnitus aurium, unspecified laterality 07/03/2018  . Encounter for general adult medical examination with abnormal findings 07/31/2016  . GERD (gastroesophageal reflux disease) 03/22/2016  .  Hyperlipidemia 02/03/2016  . Breast cyst 08/26/2015  . Medicare annual wellness visit, subsequent 07/14/2015  . Overweight (BMI 25.0-29.9) 10/06/2014  . 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 Chronic sciatica, right 10/06/2013  . History of breast lump/mass excision 08/05/2013  . Hypertension 12/10/2012  . Osteoporosis 12/10/2012    Everlean Alstrom. Graylon Good, PT, DPT 02/07/21, 5:48 PM  [facility] [address] [place], [address] Phone: [phone]   Fax:  [phone]  Name: Michele Fernandez MRN: [id number] Date of Birth: [birth year]

## 2021-02-09 ENCOUNTER — Other Ambulatory Visit: Payer: Self-pay

## 2021-02-09 ENCOUNTER — Encounter: Payer: Self-pay | Admitting: Physical Therapy

## 2021-02-09 ENCOUNTER — Ambulatory Visit: Payer: Medicare HMO | Admitting: Physical Therapy

## 2021-02-09 DIAGNOSIS — G8929 Other chronic pain: Secondary | ICD-10-CM

## 2021-02-09 DIAGNOSIS — M545 Low back pain, unspecified: Secondary | ICD-10-CM | POA: Diagnosis not present

## 2021-02-09 DIAGNOSIS — M7601 Gluteal tendinitis, right hip: Secondary | ICD-10-CM

## 2021-02-09 NOTE — Therapy (Signed)
[facility] [address], [place], [address] Phone: [phone]   Fax:  [phone]  Physical Therapy Treatment  Patient Details  Name: Michele Fernandez MRN: [id number] Date of Birth: 24-Mar-1946 Referring Provider (PT): Deborra Medina, MD   Encounter Date: 02/09/2021   PT End of Session - 02/09/21 0929    Visit Number 6    Number of Visits 17    Date for PT Re-Evaluation 04/12/21    Authorization Type Aetna Medicare    Authorization Time Period [employer] Medicare reporting period from 01/19/21    PT Start Time 0902    PT Stop Time 0942    PT Time Calculation (min) 40 min    Activity Tolerance Patient tolerated treatment well    Behavior During Therapy [facility] for tasks assessed/performed           Past Medical History:  Diagnosis Date  . Cancer ([place])    skin  . GERD (gastroesophageal reflux disease)   . Hypertension   . Other sign and symptom in breast     Past Surgical History:  Procedure Laterality Date  . BREAST BIOPSY Bilateral 1993  . BREAST BIOPSY Left 1989  . CATARACT EXTRACTION W/PHACO Left 09/13/2015   Procedure: CATARACT EXTRACTION PHACO AND INTRAOCULAR LENS PLACEMENT (IOC);  Surgeon: Estill Cotta, MD;  Location: [facility] ORS;  Service: Ophthalmology;  Laterality: Left;  HLK#[id number] H US:01:22.4 AP%:26.1 CDE:37.12  . CEREBRAL ANEURYSM REPAIR  1991  . COLONOSCOPY  2014   [facility], benign colonic mucosa.  Marland Kitchen EYE SURGERY Right 11/2014  . FOOT SURGERY  2009, 2012  . [place]  2006  . UPPER GI ENDOSCOPY  2006    There were no vitals filed for this visit.   Subjective Assessment - 02/09/21 0905    Subjective Pateint reports her back continues to feel much better and she did not have pain following last treatment session. She did develop a spot of soreness to touch at the right glute region that she rates 2/10 when she touches it or when she moves a certain way. Does not  hurt when she is standing and is still doing well with figure 4 standing to put shoes/socks on. HEP is going well. Sunday she notices she went up back steps with R leg first and had no pain at all. Sitting in bed has improved with the lumbar roll. She can feel it starting to tighten up and she moves about 10-15 min. Avoided her exercise video yesterday due to pain in her glute.    Pertinent History 75yo physically active female, reports ~8 months exacerbation of central low back pain with some referral in Right buttock to lateral Right thigh. Symptoms brought on with prolonged sitting. No paresthesias, no weakness.    How long can you sit comfortably? 10-15 minutes in bed    Diagnostic tests xrays of lumbar spine showing DJD    Patient Stated Goals Be able to read for longer periods without symptoms.    Currently in Pain? Yes    Pain Score 2           OBJECTIVE  SLS:  R = 18 seconds with calm arms L = >16 seconds with  Increased sway and arm motion   TREATMENT:  Manual therapy:to reduce pain and tissue tension, improve range of motion, neuromodulation, in order to promote improved ability to complete functional activities. -Prone CPA and R UPA to lumbar spinewith KE wedge,grade  III-IV at L5 -STM R piriformis, glute med, glute max, lumbar paraspinals.  Therapeutic exercise:to centralize symptoms and improve ROM, strength, muscular endurance, and activity tolerance required for successful completion of functional activities. - standing lumbar extension against plinth, 1x1 - prone multifidus kick (abdominal brace with alternating hip extension), 2x10 each side -supine hip ER in hooklying with isometric holds x 3 sec (black band around thighs) x20,  -supine glute squeeze + bridge (withblackband around thighs) x 20(cuing for improve glute squeeze and core control). - SLS practice 3x30 seconds each side, touchdown UE support as needed.  - step up, 2x10 each side to 9 inch step  with touchdown UE support as needed.   Pt required multimodal cuing for proper technique and to facilitate improved neuromuscular control, strength, range of motion, and functional ability resulting in improved performance and form.    HOME EXERCISE PROGRAM Access Code: [email or web address] URL: [email or web address] Date: 02/09/2021 Prepared by: Rosita Kea  Exercises Hooklying Clamshell with Resistance - 1 x daily - 3 sets - 20 reps - 3 seconds hold Supine Bridge with Resistance Band - 1 x daily - 3 sets - 20 reps - 3 seconds hold Squat with Chair Touch - 1 x daily - 3 sets - 20 reps Single Leg Stance - 1 x daily - 3 reps - 30 seconds hold     PT Education - 02/09/21 0929    Education Details exercise form/technique    Person(s) Educated Patient    Methods Explanation;Demonstration;Tactile cues;Verbal cues    Comprehension Verbalized understanding;Returned demonstration;Verbal cues required;Tactile cues required;Need further instruction            PT Short Term Goals - 02/09/21 5621      PT SHORT TERM GOAL #1   Title After 4 weeks pt to demonstrate SLS >15 sec bilat without aggavation of Rt hip pain.    Baseline ~10sec Rt; ~3sec Left at evaluation (01/19/2021); > 15 both sides after multiple trials (02/09/2021);    Time 4    Period Weeks    Status Achieved    Target Date 02/16/21      PT SHORT TERM GOAL #2   Title After 4 weeks pt to report improved tolerance to seated reading in bed  >30 minutes without exacerbation of pain.    Baseline Pain after 5 minutes (01/19/15); tigthening and changes positions at 10-15 min (02/09/2021);    Time 4    Period Weeks    Status Partially Met    Target Date 02/16/21      PT SHORT TERM GOAL #3   Title After 4 weeks pt to reports no gross asymmetry subjective nor objective between BLE during climbing of stairs.    Baseline leg tightness with stairs (01/19/2021); able to climb stairs without difficulty (02/09/2021);    Time 4    Period  Weeks    Status Achieved    Target Date 02/16/21             PT Long Term Goals - 01/19/21 1157      PT LONG TERM GOAL #1   Title After 8 weeks pt to perform 30sec chair rise >14x without any symptoms exacerbation    Baseline At eval 5xSTS: 8sec    Time 8    Period Weeks    Status New    Target Date 03/16/21      PT LONG TERM GOAL #2   Title After 8 weeks pt to show FOTO score >85.    Baseline  78    Time 8    Period Weeks    Status New    Target Date 03/16/21                 Plan - 02/09/21 0951    Clinical Impression Statement Patient tolerated treatment session well overall and continues to have good stability in improvement between sessions. Able to progress to concentric step ups with R LE. Overall making progress but still limits activity sometimes due to pain .Patient would benefit from continued management of limiting condition by skilled physical therapist to address remaining impairments and functional limitations to work towards stated goals and return to PLOF or maximal functional independence.    Personal Factors and Comorbidities Age;Education;Sex;Behavior Pattern;Fitness    Examination-Activity Limitations Dressing;Lift;Stairs    Examination-Participation Restrictions Cleaning;Yard Work    Stability/Clinical Decision Making Stable/Uncomplicated    Rehab Potential Excellent    PT Frequency 2x / week    PT Duration 12 weeks    PT Treatment/Interventions ADLs/Self Care Home Management;Electrical Stimulation;Moist Heat;Gait training;Therapeutic exercise;Therapeutic activities;Balance training;Patient/family education;Passive range of motion;Dry needling    PT Next Visit Plan progressive strengthening as tolerated    PT Home Exercise Plan Medbridge Access Code: [email or web address] ; tennis ball relase of piriformis for home; avoid piriformis stretches as not to aggravate tendonosis;    Consulted and Agree with Plan of Care Patient           Patient will benefit  from skilled therapeutic intervention in order to improve the following deficits and impairments:  Decreased balance,Difficulty walking,Increased muscle spasms,Decreased range of motion,Decreased activity tolerance,Decreased strength  Visit Diagnosis: Chronic midline low back pain without sciatica  Gluteal tendinitis of right buttock     Problem List Patient Active Problem List   Diagnosis Date Noted  . Chronic right hip pain 01/11/2021  . Aortic atherosclerosis ([place]) 01/11/2021  . Abdominal fullness in suprapubic region 01/11/2021  . Allergy history, drug 08/22/2020  . Vertigo 02/09/2019  . Tinnitus aurium, unspecified laterality 07/03/2018  . Encounter for general adult medical examination with abnormal findings 07/31/2016  . GERD (gastroesophageal reflux disease) 03/22/2016  . Hyperlipidemia 02/03/2016  . Breast cyst 08/26/2015  . Medicare annual wellness visit, subsequent 07/14/2015  . Overweight (BMI 25.0-29.9) 10/06/2014  . 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 Chronic sciatica, right 10/06/2013  . History of breast lump/mass excision 08/05/2013  . Hypertension 12/10/2012  . Osteoporosis 12/10/2012    Everlean Alstrom. Graylon Good, PT, DPT 02/09/21, 9:53 AM  [facility] [address], [place], [address] Phone: [phone]   Fax:  [phone]  Name: Michele Fernandez MRN: [id number] Date of Birth: August 20, 1946

## 2021-02-14 ENCOUNTER — Other Ambulatory Visit: Payer: Self-pay

## 2021-02-14 ENCOUNTER — Ambulatory Visit: Payer: Medicare HMO | Admitting: Physical Therapy

## 2021-02-14 ENCOUNTER — Encounter: Payer: Self-pay | Admitting: Physical Therapy

## 2021-02-14 DIAGNOSIS — G8929 Other chronic pain: Secondary | ICD-10-CM

## 2021-02-14 DIAGNOSIS — M7601 Gluteal tendinitis, right hip: Secondary | ICD-10-CM

## 2021-02-14 DIAGNOSIS — M545 Low back pain, unspecified: Secondary | ICD-10-CM | POA: Diagnosis not present

## 2021-02-14 NOTE — Therapy (Signed)
[facility] [address] [place], [address] Phone: [phone]   Fax:  [phone]  Physical Therapy Treatment  Patient Details  Name: Michele Fernandez MRN: [id number] Date of Birth: [birth year] Referring Provider (PT): Deborra Medina, MD   Encounter Date: 02/14/2021   PT End of Session - 02/14/21 1039    Visit Number 7    Number of Visits 17    Date for PT Re-Evaluation 04/12/21    Authorization Type Aetna Medicare    Authorization Time Period [employer] Medicare reporting period from 01/19/21    PT Start Time 1032    PT Stop Time 1112    PT Time Calculation (min) 40 min    Activity Tolerance Patient tolerated treatment well    Behavior During Therapy [facility] for tasks assessed/performed           Past Medical History:  Diagnosis Date  . Cancer ([place])    skin  . GERD (gastroesophageal reflux disease)   . Hypertension   . Other sign and symptom in breast     Past Surgical History:  Procedure Laterality Date  . BREAST BIOPSY Bilateral 1993  . BREAST BIOPSY Left 1989  . CATARACT EXTRACTION W/PHACO Left 09/13/2015   Procedure: CATARACT EXTRACTION PHACO AND INTRAOCULAR LENS PLACEMENT (IOC);  Surgeon: Estill Cotta, MD;  Location: [facility] ORS;  Service: Ophthalmology;  Laterality: Left;  JSH#[id number] H US:01:22.4 AP%:26.1 CDE:37.12  . CEREBRAL ANEURYSM REPAIR  1991  . COLONOSCOPY  2014   [facility], benign colonic mucosa.  Marland Kitchen EYE SURGERY Right 11/2014  . FOOT SURGERY  2009, 2012  . [place]  2006  . UPPER GI ENDOSCOPY  2006    There were no vitals filed for this visit.   Subjective Assessment - 02/14/21 1035    Subjective Patient reports she is doing really well. She fell asleep reading with the towel roll behind her last night with no pain. She has been going up and down stairs and completing her workout videos without difficulty. She reports the only pain she has now is to touch and  it is minor. She has been doing more lumbar extension exercises since last treatment session.    Pertinent History 75yo physically active female, reports ~8 months exacerbation of central low back pain with some referral in Right buttock to lateral Right thigh. Symptoms brought on with prolonged sitting. No paresthesias, no weakness.    How long can you sit comfortably? 10-15 minutes in bed    Diagnostic tests xrays of lumbar spine showing DJD    Patient Stated Goals Be able to read for longer periods without symptoms.    Currently in Pain? No/denies           OBJECTIVE  FOTO = 99 (02/14/2021);  - 30 second chair rise: x18 from 18.5 inch plinth with no UE support.    TREATMENT:  Manual therapy:to reduce pain and tissue tension, improve range of motion, neuromodulation, in order to promote improved ability to complete functional activities. -Prone CPA and R UPA to lumbar spinewith KE wedge,grade III-IV focusing at L5 -STM R piriformis, glute med, glute max, lumbar paraspinals. - educated patient on self-mobilization with LAX or tennis ball on wall.   Therapeutic exercise:to centralize symptoms and improve ROM, strength, muscular endurance, and activity tolerance required for successful completion of functional activities. - 30 second chair rise: x18 from 18.5 inch plinth with no UE support.  - prone multifidus kick (  abdominal brace with alternating hip extension), 1x20 each side -supine hip ER in hooklying with isometric holds x 3 sec (black band around thighs) x20,  -supine glute squeeze + bridge (withblackband around thighs) x 20(cuing for improve glute squeeze and core control). - squats 2x10 - educated patient on self-mobilization with LAX or tennis ball on wall.  Pt required multimodal cuing for proper technique and to facilitate improved neuromuscular control, strength, range of motion, and functional ability resulting in improved performance and form.    HOME  EXERCISE PROGRAM Access Code: [email or web address] URL: [email or web address] Date: 02/14/2021 Prepared by: Rosita Kea  Exercises Hooklying Clamshell with Resistance - 1 x daily - 3 sets - 20 reps - 3 seconds hold Supine Bridge with Resistance Band - 1 x daily - 3 sets - 20 reps - 3 seconds hold Squat with Chair Touch - 1 x daily - 3 sets - 20 reps Single Leg Stance - 1 x daily - 3 reps - 30 seconds hold Standing Glute Med Mobilization with Small Ball on Wall    PT Education - 02/14/21 1039    Education Details exercise form/technique. POC    Person(s) Educated Patient    Methods Explanation;Demonstration;Tactile cues;Verbal cues    Comprehension Verbalized understanding;Returned demonstration;Verbal cues required;Tactile cues required            PT Short Term Goals - 02/14/21 1739      PT SHORT TERM GOAL #1   Title After 4 weeks pt to demonstrate SLS >15 sec bilat without aggavation of Rt hip pain.    Baseline ~10sec Rt; ~3sec Left at evaluation (01/19/2021); > 15 both sides after multiple trials (02/09/2021);    Time 4    Period Weeks    Status Achieved    Target Date 02/16/21      PT SHORT TERM GOAL #2   Title After 4 weeks pt to report improved tolerance to seated reading in bed  >30 minutes without exacerbation of pain.    Baseline Pain after 5 minutes (01/19/15); tigthening and changes positions at 10-15 min (02/09/2021); fell asleep no pain (02/14/2021);    Time 4    Period Weeks    Status Achieved    Target Date 02/16/21      PT SHORT TERM GOAL #3   Title After 4 weeks pt to reports no gross asymmetry subjective nor objective between BLE during climbing of stairs.    Baseline leg tightness with stairs (01/19/2021); able to climb stairs without difficulty (02/09/2021);    Time 4    Period Weeks    Status Achieved    Target Date 02/16/21             PT Long Term Goals - 02/14/21 1737      PT LONG TERM GOAL #1   Title After 8 weeks pt to perform 30sec chair  rise >14x without any symptoms exacerbation    Baseline At eval 5xSTS: 8sec (01/19/2021); - 30 second chair rise: x18 from 18.5 inch plinth with no UE support (02/14/2021);    Time 8    Period Weeks    Status Achieved    Target Date 03/16/21      PT LONG TERM GOAL #2   Title After 8 weeks pt to show FOTO score >85.    Baseline 78 (01/19/2021); 99 (02/14/2021);    Time 8    Period Weeks    Status Achieved  Plan - 02/14/21 1736    Clinical Impression Statement Patient demonstrates excellent progress and has met or nearly met all goals. Patient is to decrease visit frequency to ensure the stability of improvements over time prior to full discharge. Patient would benefit from continued management of limiting condition by skilled physical therapist to address remaining impairments and functional limitations to work towards stated goals and return to PLOF or maximal functional independence.    Personal Factors and Comorbidities Age;Education;Sex;Behavior Pattern;Fitness    Examination-Activity Limitations Dressing;Lift;Stairs    Examination-Participation Restrictions Cleaning;Yard Work    Stability/Clinical Decision Making Stable/Uncomplicated    Rehab Potential Excellent    PT Frequency 2x / week    PT Duration 12 weeks    PT Treatment/Interventions ADLs/Self Care Home Management;Electrical Stimulation;Moist Heat;Gait training;Therapeutic exercise;Therapeutic activities;Balance training;Patient/family education;Passive range of motion;Dry needling    PT Next Visit Plan progressive strengthening as tolerated    PT Home Exercise Plan Medbridge Access Code: [email or web address] ; tennis ball relase of piriformis for home; avoid piriformis stretches as not to aggravate tendonosis;    Consulted and Agree with Plan of Care Patient           Patient will benefit from skilled therapeutic intervention in order to improve the following deficits and impairments:  Decreased balance,Difficulty  walking,Increased muscle spasms,Decreased range of motion,Decreased activity tolerance,Decreased strength  Visit Diagnosis: Chronic midline low back pain without sciatica  Gluteal tendinitis of right buttock     Problem List Patient Active Problem List   Diagnosis Date Noted  . Chronic right hip pain 01/11/2021  . Aortic atherosclerosis ([place]) 01/11/2021  . Abdominal fullness in suprapubic region 01/11/2021  . Allergy history, drug 08/22/2020  . Vertigo 02/09/2019  . Tinnitus aurium, unspecified laterality 07/03/2018  . Encounter for general adult medical examination with abnormal findings 07/31/2016  . GERD (gastroesophageal reflux disease) 03/22/2016  . Hyperlipidemia 02/03/2016  . Breast cyst 08/26/2015  . Medicare annual wellness visit, subsequent 07/14/2015  . Overweight (BMI 25.0-29.9) 10/06/2014  . 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 Chronic sciatica, right 10/06/2013  . History of breast lump/mass excision 08/05/2013  . Hypertension 12/10/2012  . Osteoporosis 12/10/2012   Everlean Alstrom. Graylon Good, PT, DPT 02/14/21, 5:40 PM  [facility] [address], [place], [address] Phone: [phone]   Fax:  [phone]  Name: Michele Fernandez MRN: [id number] Date of Birth: Nov 24, 1946

## 2021-02-16 ENCOUNTER — Encounter: Payer: Medicare HMO | Admitting: Physical Therapy

## 2021-02-22 ENCOUNTER — Encounter: Payer: Self-pay | Admitting: Physical Therapy

## 2021-02-22 ENCOUNTER — Ambulatory Visit: Payer: Medicare HMO | Admitting: Physical Therapy

## 2021-02-22 ENCOUNTER — Other Ambulatory Visit: Payer: Self-pay

## 2021-02-22 DIAGNOSIS — M7601 Gluteal tendinitis, right hip: Secondary | ICD-10-CM

## 2021-02-22 DIAGNOSIS — M545 Low back pain, unspecified: Secondary | ICD-10-CM | POA: Diagnosis not present

## 2021-02-22 DIAGNOSIS — G8929 Other chronic pain: Secondary | ICD-10-CM

## 2021-02-22 NOTE — Therapy (Signed)
[facility] [address] [place], [address] Phone: [phone]   Fax:  [phone]  Physical Therapy Treatment  Patient Details  Name: Michele Fernandez MRN: [id number] Date of Birth: 14-Dec-1945 Referring Provider (PT): Deborra Medina, MD   Encounter Date: 02/22/2021   PT End of Session - 02/22/21 1005    Visit Number 8    Number of Visits 17    Date for PT Re-Evaluation 04/12/21    Authorization Type Aetna Medicare    Authorization Time Period [employer] Medicare reporting period from 01/19/21    PT Start Time 0945    PT Stop Time 1025    PT Time Calculation (min) 40 min    Activity Tolerance Patient tolerated treatment well    Behavior During Therapy [facility] for tasks assessed/performed           Past Medical History:  Diagnosis Date  . Cancer ([place])    skin  . GERD (gastroesophageal reflux disease)   . Hypertension   . Other sign and symptom in breast     Past Surgical History:  Procedure Laterality Date  . BREAST BIOPSY Bilateral 1993  . BREAST BIOPSY Left 1989  . CATARACT EXTRACTION W/PHACO Left 09/13/2015   Procedure: CATARACT EXTRACTION PHACO AND INTRAOCULAR LENS PLACEMENT (IOC);  Surgeon: Estill Cotta, MD;  Location: [facility] ORS;  Service: Ophthalmology;  Laterality: Left;  QIW#[id number] H US:01:22.4 AP%:26.1 CDE:37.12  . CEREBRAL ANEURYSM REPAIR  1991  . COLONOSCOPY  2014   [facility], benign colonic mucosa.  Marland Kitchen EYE SURGERY Right 11/2014  . FOOT SURGERY  2009, 2012  . [place]  2006  . UPPER GI ENDOSCOPY  2006    There were no vitals filed for this visit.   Subjective Assessment - 02/22/21 0945    Subjective Patient reports she is feeling well. As long as she does her exercises daily she feels good. She did have more pain when she did not do the exercises one day. She also had a little pain yesterday when she did too much gardening with poor body mechanics. Has no pain  today and the tender spot on her glute is not as tender to touch. No pain upon arrival today. Would like to check in again in 2 weeks. Reports manual continues to feel better    Pertinent History 75yo physically active female, reports ~8 months exacerbation of central low back pain with some referral in Right buttock to lateral Right thigh. Symptoms brought on with prolonged sitting. No paresthesias, no weakness.    How long can you sit comfortably? 10-15 minutes in bed    Diagnostic tests xrays of lumbar spine showing DJD    Patient Stated Goals Be able to read for longer periods without symptoms.    Currently in Pain? No/denies          OBJECTIVE  FOTO = 99 (02/14/2021);  - 30 second chair rise: x18 from 18.5 inch plinth with no UE support.   TREATMENT:  Manual therapy:to reduce pain and tissue tension, improve range of motion, neuromodulation, in order to promote improved ability to complete functional activities. -Prone CPA and R UPA to lumbar spinewith KE wedge,grade III-IV focusing at L5 -STM R piriformis, glute med, glute max, lumbar paraspinals.   Therapeutic exercise:to centralize symptoms and improve ROM, strength, muscular endurance, and activity tolerance required for successful completion of functional activities. - prone multifidus kick (abdominal brace with hip extension), 1x20 each side -  squats 2x10 (2nd/3rd set with 5#DB held at chest).  - RDL with 5# DB held by the end, 2x10 - hooklying single leg bridge, 5-10 each side -supine hip ER in hooklying with isometric holds x 3 sec (black band around thighs) x20,  -supine glute squeeze + bridge (withblackband around thighs) x 20(cuing for improve glute squeeze and core control). - Education on HEP including handout   Pt required multimodal cuing for proper technique and to facilitate improved neuromuscular control, strength, range of motion, and functional ability resulting in improved performance and  form.    HOME EXERCISE PROGRAM Access Code: [email or web address] URL: [email or web address] Date: 02/14/2021 Prepared by: Rosita Kea  Exercises Hooklying Clamshell with Resistance - 1 x daily - 3 sets - 20 reps - 3 seconds hold Supine Bridge with Resistance Band - 1 x daily - 3 sets - 20 reps - 3 seconds hold Squat with Chair Touch - 1 x daily - 3 sets - 20 reps Single Leg Stance - 1 x daily - 3 reps - 30 seconds hold Standing Glute Med Mobilization with Small Ball on Wall  [email or web address] [id number] Home Exercise Program [[email or web address]]  [place] Squat -  Repeat 20 Times, Complete 1 Set, Perform 1 Times a Day    PT Education - 02/22/21 1005    Education Details exercise form/technique.    Person(s) Educated Patient    Methods Explanation;Demonstration;Tactile cues;Verbal cues    Comprehension Verbalized understanding;Returned demonstration;Verbal cues required;Tactile cues required;Need further instruction            PT Short Term Goals - 02/14/21 1739      PT SHORT TERM GOAL #1   Title After 4 weeks pt to demonstrate SLS >15 sec bilat without aggavation of Rt hip pain.    Baseline ~10sec Rt; ~3sec Left at evaluation (01/19/2021); > 15 both sides after multiple trials (02/09/2021);    Time 4    Period Weeks    Status Achieved    Target Date 02/16/21      PT SHORT TERM GOAL #2   Title After 4 weeks pt to report improved tolerance to seated reading in bed  >30 minutes without exacerbation of pain.    Baseline Pain after 5 minutes (01/19/15); tigthening and changes positions at 10-15 min (02/09/2021); fell asleep no pain (02/14/2021);    Time 4    Period Weeks    Status Achieved    Target Date 02/16/21      PT SHORT TERM GOAL #3   Title After 4 weeks pt to reports no gross asymmetry subjective nor objective between BLE during climbing of stairs.    Baseline leg tightness with stairs (01/19/2021); able to climb stairs without difficulty (02/09/2021);    Time 4    Period Weeks     Status Achieved    Target Date 02/16/21             PT Long Term Goals - 02/14/21 1737      PT LONG TERM GOAL #1   Title After 8 weeks pt to perform 30sec chair rise >14x without any symptoms exacerbation    Baseline At eval 5xSTS: 8sec (01/19/2021); - 30 second chair rise: x18 from 18.5 inch plinth with no UE support (02/14/2021);    Time 8    Period Weeks    Status Achieved    Target Date 03/16/21      PT LONG TERM GOAL #2   Title After 8 weeks pt to show FOTO score >85.  Baseline 78 (01/19/2021); 99 (02/14/2021);    Time 8    Period Weeks    Status Achieved                 Plan - 02/22/21 1039    Clinical Impression Statement Patient tolerated treatment well overall and was less tender to touch at right glute and was able to advance to more challenging exercises focusing on improving tolerance to lateral stepping and lifting that she still has pain from occasionally at home. Would like to return for one more session in 2 weeks prior to discharge to make sure she is still doing well at that point and for appropriate update in HEP.  Patient would benefit from continued management of limiting condition by skilled physical therapist to address remaining impairments and functional limitations to work towards stated goals and return to PLOF or maximal functional independence.    Personal Factors and Comorbidities Age;Education;Sex;Behavior Pattern;Fitness    Examination-Activity Limitations Dressing;Lift;Stairs    Examination-Participation Restrictions Cleaning;Yard Work    Stability/Clinical Decision Making Stable/Uncomplicated    Rehab Potential Excellent    PT Frequency 2x / week    PT Duration 12 weeks    PT Treatment/Interventions ADLs/Self Care Home Management;Electrical Stimulation;Moist Heat;Gait training;Therapeutic exercise;Therapeutic activities;Balance training;Patient/family education;Passive range of motion;Dry needling    PT Next Visit Plan progressive  strengthening as tolerated    PT Home Exercise Plan Medbridge Access Code: [email or web address] ; tennis ball relase of piriformis for home; avoid piriformis stretches as not to aggravate tendonosis;    Consulted and Agree with Plan of Care Patient           Patient will benefit from skilled therapeutic intervention in order to improve the following deficits and impairments:  Decreased balance,Difficulty walking,Increased muscle spasms,Decreased range of motion,Decreased activity tolerance,Decreased strength  Visit Diagnosis: Chronic midline low back pain without sciatica  Gluteal tendinitis of right buttock     Problem List Patient Active Problem List   Diagnosis Date Noted  . Chronic right hip pain 01/11/2021  . Aortic atherosclerosis ([place]) 01/11/2021  . Abdominal fullness in suprapubic region 01/11/2021  . Allergy history, drug 08/22/2020  . Vertigo 02/09/2019  . Tinnitus aurium, unspecified laterality 07/03/2018  . Encounter for general adult medical examination with abnormal findings 07/31/2016  . GERD (gastroesophageal reflux disease) 03/22/2016  . Hyperlipidemia 02/03/2016  . Breast cyst 08/26/2015  . Medicare annual wellness visit, subsequent 07/14/2015  . Overweight (BMI 25.0-29.9) 10/06/2014  . 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 Chronic sciatica, right 10/06/2013  . History of breast lump/mass excision 08/05/2013  . Hypertension 12/10/2012  . Osteoporosis 12/10/2012   Everlean Alstrom. Graylon Good, PT, DPT 02/22/21, 10:42 AM  [facility] [address], [place], [address] Phone: [phone]   Fax:  [phone]  Name: IVALEE STRAUSER MRN: [id number] Date of Birth: Jan 08, 1946

## 2021-02-24 ENCOUNTER — Encounter: Payer: Medicare HMO | Admitting: Physical Therapy

## 2021-03-01 ENCOUNTER — Encounter: Payer: Medicare HMO | Admitting: Physical Therapy

## 2021-03-03 ENCOUNTER — Encounter: Payer: Medicare HMO | Admitting: Physical Therapy

## 2021-03-08 ENCOUNTER — Encounter: Payer: Self-pay | Admitting: Physical Therapy

## 2021-03-08 ENCOUNTER — Ambulatory Visit: Payer: Medicare HMO | Attending: Internal Medicine | Admitting: Physical Therapy

## 2021-03-08 ENCOUNTER — Other Ambulatory Visit: Payer: Self-pay

## 2021-03-08 DIAGNOSIS — G8929 Other chronic pain: Secondary | ICD-10-CM | POA: Diagnosis present

## 2021-03-08 DIAGNOSIS — M545 Low back pain, unspecified: Secondary | ICD-10-CM | POA: Diagnosis not present

## 2021-03-08 DIAGNOSIS — M7601 Gluteal tendinitis, right hip: Secondary | ICD-10-CM | POA: Diagnosis present

## 2021-03-08 NOTE — Therapy (Signed)
[facility] [address] [place], [address] Phone: [phone]   Fax:  [phone]  Physical Therapy Treatment / Discharge Summary Dates of reporting: 01/19/2021 - 03/08/2021  Patient Details  Name: ELMARIE DEVLIN MRN: [id number] Date of Birth: Dec 12, 1945 Referring Provider (PT): Deborra Medina, MD   Encounter Date: 03/08/2021   PT End of Session - 03/08/21 1121    Visit Number 9    Number of Visits 17    Date for PT Re-Evaluation 04/12/21    Authorization Type A[employer]na Medicare    Authorization Time Period [employer] Medicare reporting period from 01/19/21    PT Start Time 1117    PT Stop Time 1144    PT Time Calculation (min) 27 min    Activity Tolerance Patient tolerated treatment well    Behavior During Therapy [facility] for tasks assessed/performed           Past Medical History:  Diagnosis Date  . Cancer ([place])    skin  . GERD (gastroesophageal reflux disease)   . Hypertension   . Other sign and symptom in breast     Past Surgical History:  Procedure Laterality Date  . BREAST BIOPSY Bilateral 1993  . BREAST BIOPSY Left 1989  . CATARACT EXTRACTION W/PHACO Left 09/13/2015   Procedure: CATARACT EXTRACTION PHACO AND INTRAOCULAR LENS PLACEMENT (IOC);  Surgeon: Estill Cotta, MD;  Location: [facility] ORS;  Service: Ophthalmology;  Laterality: Left;  SFK#[id number] H US:01:22.4 AP%:26.1 CDE:37.12  . CEREBRAL ANEURYSM REPAIR  1991  . COLONOSCOPY  2014   [facility] , benign colonic mucosa.  Marland Kitchen EYE SURGERY Right 11/2014  . FOOT SURGERY  2009, 2012  . [place]  2006  . UPPER GI ENDOSCOPY  2006    There were no vitals filed for this visit.   Subjective Assessment - 03/08/21 1123    Subjective Patient states she is feeling well and things are going well overall. She states she feels ready to discharge from PT today. States she has a little soreness at her right medial knee and lower back after  doing some squats yesterday. HEP has been going okay and she is using her ball on the wall to help massage the sore areas. No longer limited with reading.    Pertinent History 75yo physically active female, reports ~8 months exacerbation of central low back pain with some referral in Right buttock to lateral Right thigh. Symptoms brought on with prolonged sitting. No paresthesias, no weakness.    How long can you sit comfortably? 10-15 minutes in bed    Diagnostic tests xrays of lumbar spine showing DJD    Patient Stated Goals Be able to read for longer periods without symptoms.    Currently in Pain? No/denies          OBJECTIVE FOTO = 95 (03/08/2021);  TREATMENT:  Manual therapy:to reduce pain and tissue tension, improve range of motion, neuromodulation, in order to promote improved ability to complete functional activities. -Prone CPA and R UPA to lumbar spinewith KE wedge,grade III-IVfocusing atL5 -STM R piriformis, glute med, glute max, lumbar paraspinals.  Therapeutic exercise:to centralize symptoms and improve ROM, strength, muscular endurance, and activity tolerance required for successful completion of functional activities. - reviewed HEP, demonstrated use of foam roller on wall for low back STM and upper back stretch.  - Standing cervical thoracic extension/BUE flexion and serratus anterior activation, lat stretch, with foam roller up wall, 5 second holds, cuing for technique.  1x10  Pt required multimodal cuing for proper technique and to facilitate improved neuromuscular control, strength, range of motion, and functional ability resulting in improved performance and form.  HOME EXERCISE PROGRAM Access Code: [email or web address] URL: [email or web address] Date: 02/14/2021 Prepared by: Rosita Kea  Exercises Hooklying Clamshell with Resistance - 1 x daily - 3 sets - 20 reps - 3 seconds hold Supine Bridge with Resistance Band - 1 x daily - 3 sets - 20 reps  - 3 seconds hold Squat with Chair Touch - 1 x daily - 3 sets - 20 reps Single Leg Stance - 1 x daily - 3 reps - 30 seconds hold Standing Glute Med Mobilization with Small Ball on Wall [email or web address] [id number] Home Exercise Program [[id number]]  [place] Squat -  Repeat 20 Times, Complete 1 Set, Perform 1 Times a Day   PT Education - 03/08/21 1148    Education Details exercise form/technique. discharge advice. POC    Person(s) Educated Patient    Methods Explanation;Demonstration;Tactile cues;Verbal cues    Comprehension Verbalized understanding;Returned demonstration            PT Short Term Goals - 02/14/21 1739      PT SHORT TERM GOAL #1   Title After 4 weeks pt to demonstrate SLS >15 sec bilat without aggavation of Rt hip pain.    Baseline ~10sec Rt; ~3sec Left at evaluation (01/19/2021); > 15 both sides after multiple trials (02/09/2021);    Time 4    Period Weeks    Status Achieved    Target Date 02/16/21      PT SHORT TERM GOAL #2   Title After 4 weeks pt to report improved tolerance to seated reading in bed  >30 minutes without exacerbation of pain.    Baseline Pain after 5 minutes (01/20/72); tigthening and changes positions at 10-15 min (02/09/2021); fell asleep no pain (02/14/2021);    Time 4    Period Weeks    Status Achieved    Target Date 02/16/21      PT SHORT TERM GOAL #3   Title After 4 weeks pt to reports no gross asymmetry subjective nor objective between BLE during climbing of stairs.    Baseline leg tightness with stairs (01/20/72); able to climb stairs without difficulty (02/09/2021);    Time 4    Period Weeks    Status Achieved    Target Date 02/16/21             PT Long Term Goals - 03/08/21 1149      PT LONG TERM GOAL #1   Title After 8 weeks pt to perform 30sec chair rise >14x without any symptoms exacerbation    Baseline At eval 5xSTS: 8sec (01/20/72); - 30 second chair rise: x18 from 18.5 inch plinth with no UE support (02/14/2021);    Time 8     Period Weeks    Status Achieved    Target Date 03/16/21      PT LONG TERM GOAL #2   Title After 8 weeks pt to show FOTO score >85.    Baseline 78 (01/19/2021); 99 (02/14/2021); 95 (03/08/2021);    Time 8    Period Weeks    Status Achieved    Target Date 03/16/21                 Plan - 03/08/21 1148    Clinical Impression Statement Patient has attended 9 physical therapy sessions this episode of care and is discharging today due to meeting all goals  and showing stability in improvement. Patient has been consistently participating in long term HEP and no longer has familiar pain to palpation or with activities. FOTO score improved to 95/100 showing excellent self-reported function. Patient would benefit from continued management of limiting condition by skilled physical therapist to address remaining impairments and functional limitations to work towards stated goals and return to PLOF or maximal functional independence.    Personal Factors and Comorbidities Age;Education;Sex;Behavior Pattern;Fitness    Examination-Activity Limitations Dressing;Lift;Stairs    Examination-Participation Restrictions Cleaning;Yard Work    Stability/Clinical Decision Making Stable/Uncomplicated    Rehab Potential Excellent    PT Frequency 2x / week    PT Duration 12 weeks    PT Treatment/Interventions ADLs/Self Care Home Management;Electrical Stimulation;Moist Heat;Gait training;Therapeutic exercise;Therapeutic activities;Balance training;Patient/family education;Passive range of motion;Dry needling    PT Next Visit Plan Patient is now discharged due to improvement in condition    PT Home Exercise Plan Medbridge Access Code: [email or web address] ; tennis ball relase of piriformis for home; avoid piriformis stretches as not to aggravate tendonosis;    Consulted and Agree with Plan of Care Patient           Patient will benefit from skilled therapeutic intervention in order to improve the following deficits and  impairments:  Decreased balance,Difficulty walking,Increased muscle spasms,Decreased range of motion,Decreased activity tolerance,Decreased strength  Visit Diagnosis: Chronic midline low back pain without sciatica  Gluteal tendinitis of right buttock     Problem List Patient Active Problem List   Diagnosis Date Noted  . Chronic right hip pain 01/11/2021  . Aortic atherosclerosis ([place]) 01/11/2021  . Abdominal fullness in suprapubic region 01/11/2021  . Allergy history, drug 08/22/2020  . Vertigo 02/09/2019  . Tinnitus aurium, unspecified laterality 07/03/2018  . Encounter for general adult medical examination with abnormal findings 07/31/2016  . GERD (gastroesophageal reflux disease) 03/22/2016  . Hyperlipidemia 02/03/2016  . Breast cyst 08/26/2015  . Medicare annual wellness visit, subsequent 07/14/2015  . Overweight (BMI 25.0-29.9) 10/06/2014  . 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 Chronic sciatica, right 10/06/2013  . History of breast lump/mass excision 08/05/2013  . Hypertension 12/10/2012  . Osteoporosis 12/10/2012    Everlean Alstrom. Graylon Good, PT, DPT 03/08/21, 11:50 AM  [facility] [address] [place], [address] Phone: [phone]   Fax:  [phone]  Name: ROQUEL BURGIN MRN: [id number] Date of Birth: 28-Sep-1946

## 2021-03-10 ENCOUNTER — Encounter: Payer: Medicare HMO | Admitting: Physical Therapy

## 2021-03-15 ENCOUNTER — Encounter: Payer: Medicare HMO | Admitting: Physical Therapy

## 2021-03-17 ENCOUNTER — Encounter: Payer: Medicare HMO | Admitting: Physical Therapy

## 2021-04-20 ENCOUNTER — Telehealth: Payer: Self-pay | Admitting: Internal Medicine

## 2021-04-20 NOTE — Telephone Encounter (Signed)
Left message for patient to call back and schedule Medicare Annual Wellness Visit (AWV) in office.   If not able to come in office, please offer to do virtually or by telephone.   Last AWV:08/08/2016   Please schedule at anytime with Nurse Health Advisor.

## 2021-04-22 DIAGNOSIS — I10 Essential (primary) hypertension: Secondary | ICD-10-CM

## 2021-04-22 DIAGNOSIS — E782 Mixed hyperlipidemia: Secondary | ICD-10-CM

## 2021-04-22 DIAGNOSIS — I700 Atherosclerosis of aorta: Secondary | ICD-10-CM

## 2021-07-08 ENCOUNTER — Other Ambulatory Visit: Payer: Self-pay

## 2021-07-08 ENCOUNTER — Other Ambulatory Visit (INDEPENDENT_AMBULATORY_CARE_PROVIDER_SITE_OTHER): Payer: Medicare HMO

## 2021-07-08 DIAGNOSIS — I10 Essential (primary) hypertension: Secondary | ICD-10-CM

## 2021-07-08 DIAGNOSIS — I700 Atherosclerosis of aorta: Secondary | ICD-10-CM | POA: Diagnosis not present

## 2021-07-08 DIAGNOSIS — E782 Mixed hyperlipidemia: Secondary | ICD-10-CM | POA: Diagnosis not present

## 2021-07-08 LAB — LIPID PANEL
Cholesterol: 145 mg/dL (ref 0–200)
HDL: 56.4 mg/dL (ref >39.00)
LDL Cholesterol: 76 mg/dL (ref 0–99)
NonHDL: 88.13
Total CHOL/HDL Ratio: 3
Triglycerides: 59 mg/dL (ref 0.0–149.0)
VLDL: 11.8 mg/dL (ref 0.0–40.0)

## 2021-07-08 LAB — CBC WITH DIFFERENTIAL/PLATELET
Basophils Absolute: 0 10*3/uL (ref 0.0–0.1)
Basophils Relative: 0.6 % (ref 0.0–3.0)
Eosinophils Absolute: 0.1 10*3/uL (ref 0.0–0.7)
Eosinophils Relative: 2.5 % (ref 0.0–5.0)
HCT: 42.3 % (ref 36.0–46.0)
Hemoglobin: 14 g/dL (ref 12.0–15.0)
Lymphocytes Relative: 35.6 % (ref 12.0–46.0)
Lymphs Abs: 2 10*3/uL (ref 0.7–4.0)
MCHC: 33.2 g/dL (ref 30.0–36.0)
MCV: 87.6 fl (ref 78.0–100.0)
Monocytes Absolute: 0.5 10*3/uL (ref 0.1–1.0)
Monocytes Relative: 9.1 % (ref 3.0–12.0)
Neutro Abs: 2.9 10*3/uL (ref 1.4–7.7)
Neutrophils Relative %: 52.2 % (ref 43.0–77.0)
Platelets: 256 10*3/uL (ref 150.0–400.0)
RBC: 4.83 Mil/uL (ref 3.87–5.11)
RDW: 13.7 % (ref 11.5–15.5)
WBC: 5.5 10*3/uL (ref 4.0–10.5)

## 2021-07-08 LAB — COMPREHENSIVE METABOLIC PANEL
ALT: 22 U/L (ref 0–35)
AST: 19 U/L (ref 0–37)
Albumin: 3.9 g/dL (ref 3.5–5.2)
Alkaline Phosphatase: 47 U/L (ref 39–117)
BUN: 14 mg/dL (ref 6–23)
CO2: 27 mEq/L (ref 19–32)
Calcium: 8.9 mg/dL (ref 8.4–10.5)
Chloride: 106 mEq/L (ref 96–112)
Creatinine, Ser: 0.85 mg/dL (ref 0.40–1.20)
GFR: 68.27 mL/min (ref >60.00)
Glucose, Bld: 89 mg/dL (ref 70–99)
Potassium: 3.8 mEq/L (ref 3.5–5.1)
Sodium: 139 mEq/L (ref 135–145)
Total Bilirubin: 0.6 mg/dL (ref 0.2–1.2)
Total Protein: 6.2 g/dL (ref 6.0–8.3)

## 2021-07-11 ENCOUNTER — Ambulatory Visit: Payer: Medicare HMO | Admitting: Internal Medicine

## 2021-07-13 ENCOUNTER — Encounter: Payer: Self-pay | Admitting: Internal Medicine

## 2021-07-13 ENCOUNTER — Other Ambulatory Visit: Payer: Self-pay | Admitting: Internal Medicine

## 2021-07-13 ENCOUNTER — Ambulatory Visit (INDEPENDENT_AMBULATORY_CARE_PROVIDER_SITE_OTHER): Payer: Medicare HMO | Admitting: Internal Medicine

## 2021-07-13 ENCOUNTER — Other Ambulatory Visit: Payer: Self-pay

## 2021-07-13 DIAGNOSIS — M818 Other osteoporosis without current pathological fracture: Secondary | ICD-10-CM

## 2021-07-13 DIAGNOSIS — I10 Essential (primary) hypertension: Secondary | ICD-10-CM

## 2021-07-13 DIAGNOSIS — D259 Leiomyoma of uterus, unspecified: Secondary | ICD-10-CM | POA: Diagnosis not present

## 2021-07-13 DIAGNOSIS — E782 Mixed hyperlipidemia: Secondary | ICD-10-CM | POA: Diagnosis not present

## 2021-07-13 DIAGNOSIS — I700 Atherosclerosis of aorta: Secondary | ICD-10-CM

## 2021-07-13 NOTE — Patient Instructions (Signed)
Read about Wegovy/Ozempic for weight loss

## 2021-07-13 NOTE — Progress Notes (Signed)
Subjective:  Patient ID: Michele Fernandez, female    DOB: [birth year]  Age: 75 y.o. MRN: [id number]  CC: Diagnoses of Aortic atherosclerosis ([place]), Mixed hyperlipidemia, Primary hypertension, Uterine leiomyoma, unspecified location, and Other osteoporosis without current pathological fracture were pertinent to this visit.  HPI Michele Fernandez presents for 6 month follow up on chronic issues including hypertension, aortic atherosclerosis,  osteoporosis and hyperlipidemia.     This visit occurred during the SARS-CoV-2 public health emergency.  Safety protocols were in place, including screening questions prior to the visit, additional usage of staff PPE, and extensive cleaning of exam room while observing appropriate contact time as indicated for disinfecting solutions.   She feels generally well  is exercising  6 days per week and following a mediterranean low GI diet. Taking magnesium  200 mg daily and calcium supplementation via dietary sources.   Hypertension: patient checks blood pressure twice weekly at home.  Readings have been for the most part < 140/80 at rest . Patient is following a reduced salt diet most days and is taking medications as prescribed   Obesity:  her weight has been stable for several years despite a careful diet  and regular participation in exercise.  She is somewhat frustrated by her inability to get her BMI < 30.  Reviewed her diet and the intensity of her exercise in detail.  She has not done a calorie count yet, but eats "the right things" most of the time.  Discussed several of the successful commercial diet plans available with regard to the reason for their success as well as the reason that patients fail to maintain weight loss.    Aortic atherosclerosis:  reviewed findings of prior CT scan today..  Patient is tolerating high potency statin therapy without side effects.      Outpatient Medications Prior to Visit  Medication Sig Dispense Refill    atorvastatin (LIPITOR) 20 MG tablet Take 1 tablet (20 mg total) by mouth daily. 90 tablet 3   Cholecalciferol (VITAMIN D3) 2000 units TABS Take 1 tablet by mouth daily.     cyclobenzaprine (FLEXERIL) 10 MG tablet Take 1 tablet (10 mg total) by mouth 3 (three) times daily as needed for muscle spasms. 60 tablet 2   famotidine (PEPCID) 20 MG tablet Take 1 tablet (20 mg total) by mouth daily. 90 tablet 1   Magnesium 250 MG TABS 1 tablet with a meal     meclizine (ANTIVERT) 25 MG tablet Take 1 tablet (25 mg total) by mouth 3 (three) times daily as needed for dizziness. 30 tablet 0   Probiotic Product (ALIGN) 4 MG CAPS Take 1 capsule by mouth 3 (three) times a week.      hydrochlorothiazide (MICROZIDE) 12.5 MG capsule Take 1 capsule (12.5 mg total) by mouth daily. 90 capsule 1   losartan (COZAAR) 50 MG tablet 1 tablet 90 tablet 1   esomeprazole (NEXIUM) 40 MG capsule  (Patient not taking: Reported on 07/13/2021)     Magnesium Oxide 200 MG TABS Take 200 mg by mouth every other day. (Patient not taking: Reported on 07/13/2021)     No facility-administered medications prior to visit.    Review of Systems;  Patient denies headache, fevers, malaise, unintentional weight loss, skin rash, eye pain, sinus congestion and sinus pain, sore throat, dysphagia,  hemoptysis , cough, dyspnea, wheezing, chest pain, palpitations, orthopnea, edema, abdominal pain, nausea, melena, diarrhea, constipation, flank pain, dysuria, hematuria, urinary  Frequency, nocturia, numbness, tingling, seizures,  Focal  weakness, Loss of consciousness,  Tremor, insomnia, depression, anxiety, and suicidal ideation.      Objective:  BP (!) 158/86 (BP Location: Left Arm, Patient Position: Sitting, Cuff Size: Normal)   Pulse 74   Temp (!) 97 F (36.1 C) (Temporal)   Resp 15   Ht 5' (1.524 m)   Wt 159 lb 12.8 oz (72.5 kg)   SpO2 97%   BMI 31.21 kg/m   BP Readings from Last 3 Encounters:  07/13/21 (!) 158/86  01/10/21 136/78   08/20/20 (!) 146/86    Wt Readings from Last 3 Encounters:  07/13/21 159 lb 12.8 oz (72.5 kg)  01/10/21 160 lb 12.8 oz (72.9 kg)  08/20/20 157 lb (71.2 kg)    General appearance: alert, cooperative and appears stated age Ears: normal TM's and external ear canals both ears Throat: lips, mucosa, and tongue normal; teeth and gums normal Neck: no adenopathy, no carotid bruit, supple, symmetrical, trachea midline and thyroid not enlarged, symmetric, no tenderness/mass/nodules Back: symmetric, no curvature. ROM normal. No CVA tenderness. Lungs: clear to auscultation bilaterally Heart: regular rate and rhythm, S1, S2 normal, no murmur, click, rub or gallop Abdomen: soft, non-tender; bowel sounds normal; no masses,  no organomegaly Pulses: 2+ and symmetric Skin: Skin color, texture, turgor normal. No rashes or lesions Lymph nodes: Cervical, supraclavicular, and axillary nodes normal.  No results found for: HGBA1C  Lab Results  Component Value Date   CREATININE 0.85 07/08/2021   CREATININE 0.81 01/06/2021   CREATININE 0.82 08/20/2020    Lab Results  Component Value Date   WBC 5.5 07/08/2021   HGB 14.0 07/08/2021   HCT 42.3 07/08/2021   PLT 256.0 07/08/2021   GLUCOSE 89 07/08/2021   CHOL 145 07/08/2021   TRIG 59.0 07/08/2021   HDL 56.40 07/08/2021   LDLDIRECT 139.0 02/01/2016   LDLCALC 76 07/08/2021   ALT 22 07/08/2021   AST 19 07/08/2021   NA 139 07/08/2021   K 3.8 07/08/2021   CL 106 07/08/2021   CREATININE 0.85 07/08/2021   BUN 14 07/08/2021   CO2 27 07/08/2021   TSH 1.90 08/20/2020   MICROALBUR <0.7 02/07/2019    US Pelvic Complete With Transvaginal  Result Date: 01/13/2021 CLINICAL DATA:  Initial evaluation for lower abdominal bloating, history of fibroids. EXAM: TRANSABDOMINAL AND TRANSVAGINAL ULTRASOUND OF PELVIS TECHNIQUE: Both transabdominal and transvaginal ultrasound examinations of the pelvis were performed. Transabdominal technique was performed for global  imaging of the pelvis including uterus, ovaries, adnexal regions, and pelvic cul-de-sac. It was necessary to proceed with endovaginal exam following the transabdominal exam to visualize the uterus, endometrium, and ovaries. COMPARISON:  Prior ultrasound from 07/21/2015. FINDINGS: Uterus Measurements: 5.7 x 3.9 x 5.2 cm = volume: 60.6 mL. Uterus is retroverted. 2.7 x 2.6 x 2.8 cm intramural to submucosal fibroid present at the right uterine fundus. This slightly distorts the endometrial stripe. Additional 1.4 x 1.4 x 1.2 cm intramural fibroid present at the lower uterine segment. 0.9 x 0.8 x 0.8 cm subserosal fibroid present at the left uterine fundus. Endometrium Thickness: 3.4 mm. No focal abnormality visualized. Small amount of simple fluid distends the endometrial cavity. Right ovary Not visualized.  No adnexal mass. Left ovary Measurements: 2.1 x 1.4 x 0.9 cm = volume: 1.4 mL. 1 cm simple cyst noted. No internal complexity, vascularity, or solid nodularity. An additional smaller 7 mm simple cyst noted as well. Other findings No abnormal free fluid. IMPRESSION: 1. Fibroid uterus as above, largest of which measures up  to 2.8 cm at the right uterine fundus. 2. Two small simple left ovarian cysts measuring up to 1 cm as above. No followup imaging recommended Note: This recommendation does not apply to premenarchal patients or to those with increased risk (genetic, family history, elevated tumor markers or other high-risk factors) of ovarian cancer. Reference: Radiology 2019 Nov; 293(2):359-371. 3. Nonvisualization of the right ovary. No other adnexal mass or free fluid. 4. Normal endometrium for age. Small amount of simple fluid distends the endometrial cavity. Electronically Signed   By: Jeannine Boga M.D.   On: 01/13/2021 16:31    Assessment & Plan:   Problem List Items Addressed This Visit       Unprioritized   Hypertension    Improved control with hctz and losartan.   Lytes and GFR are  unchanged.   Lab Results  Component Value Date   NA 139 07/08/2021   K 3.8 07/08/2021   CL 106 07/08/2021   CO2 27 07/08/2021   Lab Results  Component Value Date   CREATININE 0.85 07/08/2021         Osteoporosis    There was no change by 2019 DEXA after stopping  5 years of therapy with bisphosphonates in 2015.  Will repeat in 2024.  Evista intolerant    .       Hyperlipidemia    With aortic atherosclerosis noted on lumbar spine films.  Improved result with Changing therapy to atorvastatin ,  Goal LDL  70  Lab Results  Component Value Date   CHOL 145 07/08/2021   HDL 56.40 07/08/2021   LDLCALC 76 07/08/2021   LDLDIRECT 139.0 02/01/2016   TRIG 59.0 07/08/2021   CHOLHDL 3 07/08/2021   Lab Results  Component Value Date   ALT 22 07/08/2021   AST 19 07/08/2021   ALKPHOS 47 07/08/2021   BILITOT 0.6 07/08/2021         Aortic atherosclerosis ([facility])    Noted on plain films of lumbar spine and reviewed with patient .  She is tolerating the change  to  high potency statin therapy for goal LDL  70  Lab Results  Component Value Date   CHOL 145 07/08/2021   HDL 56.40 07/08/2021   LDLCALC 76 07/08/2021   LDLDIRECT 139.0 02/01/2016   TRIG 59.0 07/08/2021   CHOLHDL 3 07/08/2021         Fibroid uterus    abdominal fullness was appreciated on pelvic exam in February and symptomatic per patient.  Given history of fibroids and still possessing ovaries,  this was evaluated with a  pelvic ultrasound to rule out ovarian mass .  Fibroid uterus with largest fibroid measuring 2.8 cm,  And 2 small simple  Cysts on left ovary seen,  No follow up needed       . I provided  30 minutes  Was spent  reviewing patient's current problems and recent   labs and imaging studies, providing counseling on the her weight , atherosclerosis and osteoporosis,  and evaluating patient  In a face to face visit  .  No orders of the defined types were placed in this encounter.   Medications  Discontinued During This Encounter  Medication Reason   esomeprazole (NEXIUM) 40 MG capsule    Magnesium Oxide 200 MG TABS     Follow-up: Return in about 6 months (around 01/13/2022).   Crecencio Mc, MD

## 2021-07-16 NOTE — Assessment & Plan Note (Signed)
Noted on plain films of lumbar spine and reviewed with patient .  She is tolerating the change  to  high potency statin therapy for goal LDL  70  Lab Results  Component Value Date   CHOL 145 07/08/2021   HDL 56.40 07/08/2021   LDLCALC 76 07/08/2021   LDLDIRECT 139.0 02/01/2016   TRIG 59.0 07/08/2021   CHOLHDL 3 07/08/2021

## 2021-07-16 NOTE — Assessment & Plan Note (Addendum)
There was no change by 2019 DEXA after stopping  5 years of therapy with bisphosphonates in 2015.  Will repeat in 2024.  Evista intolerant    .

## 2021-07-16 NOTE — Assessment & Plan Note (Signed)
Improved control with hctz and losartan.   Lytes and GFR are unchanged.   Lab Results  Component Value Date   NA 139 07/08/2021   K 3.8 07/08/2021   CL 106 07/08/2021   CO2 27 07/08/2021   Lab Results  Component Value Date   CREATININE 0.85 07/08/2021

## 2021-07-16 NOTE — Assessment & Plan Note (Addendum)
abdominal fullness was appreciated on pelvic exam in February and symptomatic per patient.  Given history of fibroids and still possessing ovaries,  this was evaluated with a  pelvic ultrasound to rule out ovarian mass .  Fibroid uterus with largest fibroid measuring 2.8 cm,  And 2 small simple  Cysts on left ovary seen,  No follow up needed

## 2021-07-16 NOTE — Assessment & Plan Note (Signed)
With aortic atherosclerosis noted on lumbar spine films.  Improved result with Changing therapy to atorvastatin ,  Goal LDL  70  Lab Results  Component Value Date   CHOL 145 07/08/2021   HDL 56.40 07/08/2021   LDLCALC 76 07/08/2021   LDLDIRECT 139.0 02/01/2016   TRIG 59.0 07/08/2021   CHOLHDL 3 07/08/2021   Lab Results  Component Value Date   ALT 22 07/08/2021   AST 19 07/08/2021   ALKPHOS 47 07/08/2021   BILITOT 0.6 07/08/2021

## 2021-08-04 ENCOUNTER — Other Ambulatory Visit: Payer: Self-pay | Admitting: Internal Medicine

## 2021-10-08 ENCOUNTER — Other Ambulatory Visit: Payer: Self-pay | Admitting: Internal Medicine

## 2021-10-13 ENCOUNTER — Other Ambulatory Visit: Payer: Self-pay | Admitting: Internal Medicine

## 2021-10-25 LAB — HM MAMMOGRAPHY

## 2022-01-05 ENCOUNTER — Encounter: Payer: Self-pay | Admitting: Internal Medicine

## 2022-01-05 DIAGNOSIS — R5383 Other fatigue: Secondary | ICD-10-CM

## 2022-01-05 DIAGNOSIS — R7301 Impaired fasting glucose: Secondary | ICD-10-CM

## 2022-01-05 DIAGNOSIS — E782 Mixed hyperlipidemia: Secondary | ICD-10-CM

## 2022-01-05 DIAGNOSIS — I10 Essential (primary) hypertension: Secondary | ICD-10-CM

## 2022-01-23 ENCOUNTER — Other Ambulatory Visit: Payer: Self-pay

## 2022-01-23 ENCOUNTER — Other Ambulatory Visit (INDEPENDENT_AMBULATORY_CARE_PROVIDER_SITE_OTHER): Payer: Medicare HMO

## 2022-01-23 DIAGNOSIS — I10 Essential (primary) hypertension: Secondary | ICD-10-CM

## 2022-01-23 DIAGNOSIS — R7301 Impaired fasting glucose: Secondary | ICD-10-CM | POA: Diagnosis not present

## 2022-01-23 DIAGNOSIS — R5383 Other fatigue: Secondary | ICD-10-CM | POA: Diagnosis not present

## 2022-01-23 DIAGNOSIS — E782 Mixed hyperlipidemia: Secondary | ICD-10-CM | POA: Diagnosis not present

## 2022-01-23 LAB — CBC WITH DIFFERENTIAL/PLATELET
Basophils Absolute: 0 10*3/uL (ref 0.0–0.1)
Basophils Relative: 0.5 % (ref 0.0–3.0)
Eosinophils Absolute: 0.1 10*3/uL (ref 0.0–0.7)
Eosinophils Relative: 2 % (ref 0.0–5.0)
HCT: 42.5 % (ref 36.0–46.0)
Hemoglobin: 14 g/dL (ref 12.0–15.0)
Lymphocytes Relative: 31.3 % (ref 12.0–46.0)
Lymphs Abs: 1.9 10*3/uL (ref 0.7–4.0)
MCHC: 33.1 g/dL (ref 30.0–36.0)
MCV: 87.4 fl (ref 78.0–100.0)
Monocytes Absolute: 0.4 10*3/uL (ref 0.1–1.0)
Monocytes Relative: 6.7 % (ref 3.0–12.0)
Neutro Abs: 3.5 10*3/uL (ref 1.4–7.7)
Neutrophils Relative %: 59.5 % (ref 43.0–77.0)
Platelets: 265 10*3/uL (ref 150.0–400.0)
RBC: 4.86 Mil/uL (ref 3.87–5.11)
RDW: 13.3 % (ref 11.5–15.5)
WBC: 5.9 10*3/uL (ref 4.0–10.5)

## 2022-01-23 LAB — COMPREHENSIVE METABOLIC PANEL
ALT: 26 U/L (ref 0–35)
AST: 20 U/L (ref 0–37)
Albumin: 4.1 g/dL (ref 3.5–5.2)
Alkaline Phosphatase: 60 U/L (ref 39–117)
BUN: 14 mg/dL (ref 6–23)
CO2: 31 mEq/L (ref 19–32)
Calcium: 9 mg/dL (ref 8.4–10.5)
Chloride: 105 mEq/L (ref 96–112)
Creatinine, Ser: 0.83 mg/dL (ref 0.40–1.20)
GFR: 69.98 mL/min (ref >60.00)
Glucose, Bld: 93 mg/dL (ref 70–99)
Potassium: 4.2 mEq/L (ref 3.5–5.1)
Sodium: 141 mEq/L (ref 135–145)
Total Bilirubin: 0.6 mg/dL (ref 0.2–1.2)
Total Protein: 6.1 g/dL (ref 6.0–8.3)

## 2022-01-23 LAB — LIPID PANEL
Cholesterol: 152 mg/dL (ref 0–200)
HDL: 63.1 mg/dL (ref >39.00)
LDL Cholesterol: 76 mg/dL (ref 0–99)
NonHDL: 89.29
Total CHOL/HDL Ratio: 2
Triglycerides: 67 mg/dL (ref 0.0–149.0)
VLDL: 13.4 mg/dL (ref 0.0–40.0)

## 2022-01-23 LAB — MICROALBUMIN / CREATININE URINE RATIO
Creatinine,U: 31.8 mg/dL
Microalb Creat Ratio: 2.2 mg/g (ref 0.0–30.0)
Microalb, Ur: <0.7 mg/dL (ref 0.0–1.9)

## 2022-01-23 LAB — HEMOGLOBIN A1C: Hgb A1c MFr Bld: 5.6 % (ref 4.6–6.5)

## 2022-01-23 LAB — TSH: TSH: 2.28 u[IU]/mL (ref 0.35–5.50)

## 2022-01-25 ENCOUNTER — Ambulatory Visit (INDEPENDENT_AMBULATORY_CARE_PROVIDER_SITE_OTHER): Payer: Medicare HMO | Admitting: Internal Medicine

## 2022-01-25 ENCOUNTER — Other Ambulatory Visit: Payer: Self-pay

## 2022-01-25 ENCOUNTER — Encounter: Payer: Self-pay | Admitting: Internal Medicine

## 2022-01-25 VITALS — BP 120/72 | HR 78 | Temp 97.9°F | Ht 61.0 in | Wt 157.2 lb

## 2022-01-25 DIAGNOSIS — Z0000 Encounter for general adult medical examination without abnormal findings: Secondary | ICD-10-CM

## 2022-01-25 DIAGNOSIS — I10 Essential (primary) hypertension: Secondary | ICD-10-CM | POA: Diagnosis not present

## 2022-01-25 DIAGNOSIS — E782 Mixed hyperlipidemia: Secondary | ICD-10-CM

## 2022-01-25 DIAGNOSIS — I700 Atherosclerosis of aorta: Secondary | ICD-10-CM

## 2022-01-25 DIAGNOSIS — Z23 Encounter for immunization: Secondary | ICD-10-CM | POA: Diagnosis not present

## 2022-01-25 DIAGNOSIS — K2100 Gastro-esophageal reflux disease with esophagitis, without bleeding: Secondary | ICD-10-CM | POA: Diagnosis not present

## 2022-01-25 DIAGNOSIS — Z0001 Encounter for general adult medical examination with abnormal findings: Secondary | ICD-10-CM

## 2022-01-25 MED ORDER — ZOSTER VAC RECOMB ADJUVANTED 50 MCG/0.5ML IM SUSR: 0.5000 mL | Freq: Once | INTRAMUSCULAR | 1 refills | Status: AC

## 2022-01-25 MED ORDER — FAMOTIDINE 40 MG PO TABS: 40.0000 mg | ORAL_TABLET | Freq: Every day | ORAL | 1 refills | Status: DC

## 2022-01-25 NOTE — Assessment & Plan Note (Signed)
Reviewed findings of prior CT scan today..  Patient is tolerating high potency statin therapy  

## 2022-01-25 NOTE — Addendum Note (Signed)
Addended by: Adair Laundry on: 01/25/2022 09:24 AM   Modules accepted: Orders

## 2022-01-25 NOTE — Assessment & Plan Note (Signed)
Well controlled on current regimen. Renal function stable, no changes today. 

## 2022-01-25 NOTE — Progress Notes (Signed)
Patient ID: DALANI METTE, female    DOB: 1946-11-23  Age: 76 y.o. MRN: [id number]  The patient is here for annual preventive examination and management of other chronic and acute problems.  This visit occurred during the SARS-CoV-2 public health emergency.  Safety protocols were in place, including screening questions prior to the visit, additional usage of staff PPE, and extensive cleaning of exam room while observing appropriate contact time as indicated for disinfecting solutions.     The risk factors are reflected in the social history.  The roster of all physicians providing medical care to patient - is listed in the Snapshot section of the chart.  Activities of daily living:  The patient is 100% independent in all ADLs: dressing, toileting, feeding as well as independent mobility  Home safety : The patient has smoke detectors in the home. They wear seatbelts.  There are no firearms at home. There is no violence in the home.   There is no risks for hepatitis, STDs or HIV. There is no   history of blood transfusion. They have no travel history to infectious disease endemic areas of the world.  The patient has seen their dentist in the last six month. They have seen their eye doctor in the last year. They admit to slight hearing difficulty with regard to whispered voices and some television programs.  They have deferred audiologic testing in the last year.  They do not  have excessive sun exposure. Discussed the need for sun protection: hats, long sleeves and use of sunscreen if there is significant sun exposure.   Diet: the importance of a healthy diet is discussed. They do have a healthy diet.  The benefits of regular aerobic exercise were discussed. She walks ,  does strength training  5 days per week. Depression screen: there are no signs or vegative symptoms of depression- irritability, change in appetite, anhedonia, sadness/tearfullness.  Cognitive assessment: the patient  manages all their financial and personal affairs and is actively engaged. They could relate day,date,year and events; recalled 2/3 objects at 3 minutes; performed clock-face test normally.  The following portions of the patient's history were reviewed and updated as appropriate: allergies, current medications, past family history, past medical history,  past surgical history, past social history  and problem list.  Visual acuity was not assessed per patient preference since she has regular follow up with her ophthalmologist. Hearing and body mass index were assessed and reviewed.   During the course of the visit the patient was educated and counseled about appropriate screening and preventive services including : fall prevention , diabetes screening, nutrition counseling, colorectal cancer screening, and recommended immunizations.    CC: The primary encounter diagnosis was Encounter for general adult medical examination with abnormal findings. Diagnoses of Gastroesophageal reflux disease with esophagitis without hemorrhage, Aortic atherosclerosis ([place]), Mixed hyperlipidemia, and Primary hypertension were also pertinent to this visit.   1)  Hypertension: patient checks blood pressure twice weekly at home.  Readings have been consistently < 120/75 at rest . Patient is following a reduced salt diet most days and is taking losartan and hctz  as prescribed   2) Stress:  husband had major bladder surgery for removal of tumor,  beloved dog had to be euthanized ,  in December .  3) Osteoporosis:  reviewed calcium intake.  Next DEXA due 2024  4) Breast exam done by [facility]ogram nov 2022  History Shabree has a past medical history of [facility]), GERD (  gastroesophageal reflux disease), Hypertension, and Other sign and symptom in breast.   She has a past surgical history that includes Breast biopsy (Bilateral, 1993); Breast biopsy (Left, 1989); Upper gi endoscopy (2006); Hammer toe  surgery (2006); Foot surgery (2009, 2012); Cerebral aneurysm repair (1991); Colonoscopy (2014); Eye surgery (Right, 11/2014); and Cataract extraction w/PHACO (Left, 09/13/2015).   Her family history includes Alcohol abuse in her son; Cancer in her brother and maternal aunt; Heart attack in her maternal grandmother.She reports that she quit smoking about 49 years ago. Her smoking use included cigarettes. She has a 15.00 pack-year smoking history. She has never used smokeless tobacco. She reports that she does not currently use alcohol. She reports that she does not use drugs.  Outpatient Medications Prior to Visit  Medication Sig Dispense Refill   atorvastatin (LIPITOR) 20 MG tablet TAKE 1 TABLET BY MOUTH DAILY 90 tablet 3   Cholecalciferol (VITAMIN D3) 2000 units TABS Take 1 tablet by mouth daily.     hydrochlorothiazide (MICROZIDE) 12.5 MG capsule TAKE 1 CAPSULE BY MOUTH DAILY 90 capsule 1   losartan (COZAAR) 50 MG tablet TAKE 1 TABLET BY MOUTH DAILY 90 tablet 1   Magnesium 250 MG TABS 1 tablet with a meal     famotidine (PEPCID) 20 MG tablet TAKE 1 TABLET BY MOUTH DAILY 90 tablet 1   cyclobenzaprine (FLEXERIL) 10 MG tablet Take 1 tablet (10 mg total) by mouth 3 (three) times daily as needed for muscle spasms. (Patient not taking: Reported on 01/25/2022) 60 tablet 2   meclizine (ANTIVERT) 25 MG tablet Take 1 tablet (25 mg total) by mouth 3 (three) times daily as needed for dizziness. (Patient not taking: Reported on 01/25/2022) 30 tablet 0   Probiotic Product (ALIGN) 4 MG CAPS Take 1 capsule by mouth 3 (three) times a week.  (Patient not taking: Reported on 01/25/2022)     No facility-administered medications prior to visit.    Review of Systems  Patient denies headache, fevers, malaise, unintentional weight loss, skin rash, eye pain, sinus congestion and sinus pain, sore throat, dysphagia,  hemoptysis , cough, dyspnea, wheezing, chest pain, palpitations, orthopnea, edema, abdominal pain, nausea,  melena, diarrhea, constipation, flank pain, dysuria, hematuria, urinary  Frequency, nocturia, numbness, tingling, seizures,  Focal weakness, Loss of consciousness,  Tremor, insomnia, depression, anxiety, and suicidal ideation.     Objective:  BP 120/72 (BP Location: Left Arm, Patient Position: Sitting, Cuff Size: Small)    Pulse 78    Temp 97.9 F (36.6 C) (Oral)    Ht 5\' 1"  (1.549 m)    Wt 157 lb 3.2 oz (71.3 kg)    SpO2 97%    BMI 29.70 kg/m   Physical Exam  General appearance: alert, cooperative and appears stated age Ears: normal TM's and external ear canals both ears Throat: lips, mucosa, and tongue normal; teeth and gums normal Neck: no adenopathy, no carotid bruit, supple, symmetrical, trachea midline and thyroid not enlarged, symmetric, no tenderness/mass/nodules Back: symmetric, no curvature. ROM normal. No CVA tenderness. Lungs: clear to auscultation bilaterally Heart: regular rate and rhythm, S1, S2 normal, no murmur, click, rub or gallop Abdomen: soft, non-tender; bowel sounds normal; no masses,  no organomegaly Pulses: 2+ and symmetric Skin: Skin color, texture, turgor normal. No rashes or lesions Lymph nodes: Cervical, supraclavicular, and axillary nodes normal.  Assessment & Plan:   Problem List Items Addressed This Visit     Aortic atherosclerosis ([place])    Reviewed findings of prior CT scan today.Marland Kitchen  Patient is tolerating high potency statin therapy       Encounter for general adult medical examination with abnormal findings - Primary    age appropriate education and counseling updated, referrals for preventative services and immunizations addressed, dietary and smoking counseling addressed, most recent labs reviewed.  I have personally reviewed and have noted:   1) the patient's medical and social history 2) The pt's use of alcohol, tobacco, and illicit drugs 3) The patient's current medications and supplements 4) Functional ability including ADL's, fall risk, home  safety risk, hearing and visual impairment 5) Diet and physical activities 6) Evidence for depression or mood disorder 7) The patient's height, weight, and BMI have been recorded in the chart    I have made referrals, and provided counseling and education based on review of the above      GERD (gastroesophageal reflux disease)    Negative EGD 021   Now managed with famotidine       Relevant Medications   famotidine (PEPCID) 40 MG tablet   Hyperlipidemia    With aortic atherosclerosis noted on lumbar spine films.  Improved result with Changing therapy to atorvastatin ,  Goal LDL  70  Lab Results  Component Value Date   CHOL 152 01/23/2022   HDL 63.10 01/23/2022   LDLCALC 76 01/23/2022   LDLDIRECT 139.0 02/01/2016   TRIG 67.0 01/23/2022   CHOLHDL 2 01/23/2022   Lab Results  Component Value Date   ALT 26 01/23/2022   AST 20 01/23/2022   ALKPHOS 60 01/23/2022   BILITOT 0.6 01/23/2022         Hypertension    Well controlled on current regimen. Renal function stable, no changes today.       Meds ordered this encounter  Medications   famotidine (PEPCID) 40 MG tablet    Sig: Take 1 tablet (40 mg total) by mouth daily.    Dispense:  90 tablet    Refill:  1   Zoster Vaccine Adjuvanted [facility]) injection    Sig: Inject 0.5 mLs into the muscle once for 1 dose.    Dispense:  1 each    Refill:  1    Medications Discontinued During This Encounter  Medication Reason   famotidine (PEPCID) 20 MG tablet     Follow-up: No follow-ups on file.   Crecencio Mc, MD

## 2022-01-25 NOTE — Assessment & Plan Note (Signed)
With aortic atherosclerosis noted on lumbar spine films.  Improved result with Changing therapy to atorvastatin ,  Goal LDL  70  Lab Results  Component Value Date   CHOL 152 01/23/2022   HDL 63.10 01/23/2022   LDLCALC 76 01/23/2022   LDLDIRECT 139.0 02/01/2016   TRIG 67.0 01/23/2022   CHOLHDL 2 01/23/2022   Lab Results  Component Value Date   ALT 26 01/23/2022   AST 20 01/23/2022   ALKPHOS 60 01/23/2022   BILITOT 0.6 01/23/2022

## 2022-01-25 NOTE — Assessment & Plan Note (Signed)
Negative EGD 021   Now managed with famotidine

## 2022-01-25 NOTE — Assessment & Plan Note (Addendum)

## 2022-01-25 NOTE — Patient Instructions (Signed)
°  The [employer] vaccine is now available in local pharmacies and is 100% covered by Medicare  Your labs are excellent!  Change nothing.  DEXA next year  Cologuard next year

## 2022-04-08 ENCOUNTER — Other Ambulatory Visit: Payer: Self-pay | Admitting: Internal Medicine

## 2022-06-05 ENCOUNTER — Telehealth: Payer: Self-pay

## 2022-06-05 DIAGNOSIS — I10 Essential (primary) hypertension: Secondary | ICD-10-CM

## 2022-06-05 DIAGNOSIS — E782 Mixed hyperlipidemia: Secondary | ICD-10-CM

## 2022-06-05 NOTE — Telephone Encounter (Signed)
Patient states she would like to know if she is supposed to have her labs done before her appointment with Dr. Derrel Nip in August.  I don't currently see lab orders in patient's chart.

## 2022-06-06 ENCOUNTER — Encounter: Payer: Self-pay | Admitting: Internal Medicine

## 2022-06-06 DIAGNOSIS — E782 Mixed hyperlipidemia: Secondary | ICD-10-CM

## 2022-06-06 DIAGNOSIS — I10 Essential (primary) hypertension: Secondary | ICD-10-CM

## 2022-06-08 ENCOUNTER — Encounter: Payer: Self-pay | Admitting: Internal Medicine

## 2022-06-08 NOTE — Telephone Encounter (Signed)
I ordered the labs and called the patient and LVM for patient to call and schedule a lab appointment, labs are ordered. Kayelynn Abdou,cma

## 2022-07-07 ENCOUNTER — Other Ambulatory Visit: Payer: Self-pay | Admitting: Internal Medicine

## 2022-07-24 ENCOUNTER — Other Ambulatory Visit (INDEPENDENT_AMBULATORY_CARE_PROVIDER_SITE_OTHER): Payer: Medicare HMO

## 2022-07-24 DIAGNOSIS — E782 Mixed hyperlipidemia: Secondary | ICD-10-CM

## 2022-07-24 DIAGNOSIS — I10 Essential (primary) hypertension: Secondary | ICD-10-CM | POA: Diagnosis not present

## 2022-07-24 LAB — CBC WITH DIFFERENTIAL/PLATELET
Basophils Absolute: 0 10*3/uL (ref 0.0–0.1)
Basophils Relative: 0.5 % (ref 0.0–3.0)
Eosinophils Absolute: 0.1 10*3/uL (ref 0.0–0.7)
Eosinophils Relative: 1.7 % (ref 0.0–5.0)
HCT: 42.5 % (ref 36.0–46.0)
Hemoglobin: 14.1 g/dL (ref 12.0–15.0)
Lymphocytes Relative: 36.1 % (ref 12.0–46.0)
Lymphs Abs: 2.2 10*3/uL (ref 0.7–4.0)
MCHC: 33.2 g/dL (ref 30.0–36.0)
MCV: 86.7 fl (ref 78.0–100.0)
Monocytes Absolute: 0.5 10*3/uL (ref 0.1–1.0)
Monocytes Relative: 8 % (ref 3.0–12.0)
Neutro Abs: 3.2 10*3/uL (ref 1.4–7.7)
Neutrophils Relative %: 53.7 % (ref 43.0–77.0)
Platelets: 259 10*3/uL (ref 150.0–400.0)
RBC: 4.91 Mil/uL (ref 3.87–5.11)
RDW: 13.6 % (ref 11.5–15.5)
WBC: 6 10*3/uL (ref 4.0–10.5)

## 2022-07-24 LAB — COMPREHENSIVE METABOLIC PANEL
ALT: 19 U/L (ref 0–35)
AST: 16 U/L (ref 0–37)
Albumin: 3.9 g/dL (ref 3.5–5.2)
Alkaline Phosphatase: 56 U/L (ref 39–117)
BUN: 12 mg/dL (ref 6–23)
CO2: 26 mEq/L (ref 19–32)
Calcium: 8.8 mg/dL (ref 8.4–10.5)
Chloride: 106 mEq/L (ref 96–112)
Creatinine, Ser: 0.85 mg/dL (ref 0.40–1.20)
GFR: 67.78 mL/min (ref >60.00)
Glucose, Bld: 92 mg/dL (ref 70–99)
Potassium: 3.9 mEq/L (ref 3.5–5.1)
Sodium: 141 mEq/L (ref 135–145)
Total Bilirubin: 0.6 mg/dL (ref 0.2–1.2)
Total Protein: 5.8 g/dL — ABNORMAL LOW (ref 6.0–8.3)

## 2022-07-24 LAB — LIPID PANEL
Cholesterol: 156 mg/dL (ref 0–200)
HDL: 55.8 mg/dL (ref >39.00)
LDL Cholesterol: 82 mg/dL (ref 0–99)
NonHDL: 99.77
Total CHOL/HDL Ratio: 3
Triglycerides: 91 mg/dL (ref 0.0–149.0)
VLDL: 18.2 mg/dL (ref 0.0–40.0)

## 2022-07-24 LAB — B12 AND FOLATE PANEL
Folate: 13 ng/mL (ref >5.9)
Vitamin B-12: 252 pg/mL (ref 211–911)

## 2022-07-26 ENCOUNTER — Ambulatory Visit: Payer: Medicare HMO | Admitting: Internal Medicine

## 2022-07-27 ENCOUNTER — Encounter: Payer: Self-pay | Admitting: Internal Medicine

## 2022-07-27 ENCOUNTER — Ambulatory Visit: Payer: Medicare HMO | Admitting: Internal Medicine

## 2022-07-27 VITALS — BP 116/78 | HR 78 | Temp 98.0°F | Ht 61.0 in | Wt 159.0 lb

## 2022-07-27 DIAGNOSIS — K2100 Gastro-esophageal reflux disease with esophagitis, without bleeding: Secondary | ICD-10-CM

## 2022-07-27 DIAGNOSIS — R7301 Impaired fasting glucose: Secondary | ICD-10-CM

## 2022-07-27 DIAGNOSIS — I10 Essential (primary) hypertension: Secondary | ICD-10-CM

## 2022-07-27 DIAGNOSIS — E559 Vitamin D deficiency, unspecified: Secondary | ICD-10-CM | POA: Diagnosis not present

## 2022-07-27 DIAGNOSIS — M818 Other osteoporosis without current pathological fracture: Secondary | ICD-10-CM

## 2022-07-27 DIAGNOSIS — E538 Deficiency of other specified B group vitamins: Secondary | ICD-10-CM

## 2022-07-27 DIAGNOSIS — E782 Mixed hyperlipidemia: Secondary | ICD-10-CM | POA: Diagnosis not present

## 2022-07-27 NOTE — Patient Instructions (Signed)
Your B12 is on the low side,  likely due to use of Nexium  I recommend starting an OTC supplement  2500 mcg daily

## 2022-07-27 NOTE — Assessment & Plan Note (Signed)
Home readings on a validated machine have been consistently < 130/80.  No changes to regimen today

## 2022-07-27 NOTE — Progress Notes (Signed)
Subjective:  Patient ID: Michele Fernandez, female    DOB: [birth year]  Age: 76 y.o. MRN: [id number]  CC: The primary encounter diagnosis was Mixed hyperlipidemia. Diagnoses of Primary hypertension, Vitamin D deficiency, Impaired fasting glucose, B12 deficiency, Gastroesophageal reflux disease with esophagitis without hemorrhage, and Other osteoporosis without current pathological fracture were also pertinent to this visit.   HPI Michele Fernandez presents for  6 month follow up Chief Complaint  Patient presents with   Follow-up    6 month follow up on hypertension, hyperlipidemia   1) HTN:  Hypertension: patient checks blood pressure twice weekly at home.  Readings have been for the most part < 130/80 at rest . Patient is following a reduce salt diet most days and is taking medications as prescribed  (hctz 25 mg and losartan 50 mg )   2) HLD:  she is tolerating atorvastatin taken daily   3) Chronic low back pain: not constant,  managed  with behavior modification    Outpatient Medications Prior to Visit  Medication Sig Dispense Refill   atorvastatin (LIPITOR) 20 MG tablet TAKE 1 TABLET BY MOUTH DAILY 90 tablet 3   Cholecalciferol (VITAMIN D3) 2000 units TABS Take 1 tablet by mouth daily.     famotidine (PEPCID) 40 MG tablet Take 1 tablet (40 mg total) by mouth daily. 90 tablet 1   hydrochlorothiazide (MICROZIDE) 12.5 MG capsule TAKE 1 CAPSULE BY MOUTH DAILY 90 capsule 1   losartan (COZAAR) 50 MG tablet TAKE 1 TABLET BY MOUTH DAILY 90 tablet 1   Magnesium 250 MG TABS 1 tablet with a meal     Probiotic Product (ALIGN) 4 MG CAPS Take 1 capsule by mouth 3 (three) times a week.     cyclobenzaprine (FLEXERIL) 10 MG tablet Take 1 tablet (10 mg total) by mouth 3 (three) times daily as needed for muscle spasms. (Patient not taking: Reported on 01/25/2022) 60 tablet 2   meclizine (ANTIVERT) 25 MG tablet Take 1 tablet (25 mg total) by mouth 3 (three) times daily as needed for dizziness.  (Patient not taking: Reported on 01/25/2022) 30 tablet 0   No facility-administered medications prior to visit.    Review of Systems;  Patient denies headache, fevers, malaise, unintentional weight loss, skin rash, eye pain, sinus congestion and sinus pain, sore throat, dysphagia,  hemoptysis , cough, dyspnea, wheezing, chest pain, palpitations, orthopnea, edema, abdominal pain, nausea, melena, diarrhea, constipation, flank pain, dysuria, hematuria, urinary  Frequency, nocturia, numbness, tingling, seizures,  Focal weakness, Loss of consciousness,  Tremor, insomnia, depression, anxiety, and suicidal ideation.      Objective:  BP 116/78   Pulse 78   Temp 98 F (36.7 C) (Oral)   Ht '5\' 1"'$  (1.549 m)   Wt 159 lb (72.1 kg)   SpO2 96%   BMI 30.04 kg/m   BP Readings from Last 3 Encounters:  07/27/22 116/78  01/25/22 120/72  07/13/21 (!) 158/86    Wt Readings from Last 3 Encounters:  07/27/22 159 lb (72.1 kg)  01/25/22 157 lb 3.2 oz (71.3 kg)  07/13/21 159 lb 12.8 oz (72.5 kg)    General appearance: alert, cooperative and appears stated age Ears: normal TM's and external ear canals both ears Throat: lips, mucosa, and tongue normal; teeth and gums normal Neck: no adenopathy, no carotid bruit, supple, symmetrical, trachea midline and thyroid not enlarged, symmetric, no tenderness/mass/nodules Back: symmetric, no curvature. ROM normal. No CVA tenderness. Lungs: clear to auscultation bilaterally Heart: regular rate and  rhythm, S1, S2 normal, no murmur, click, rub or gallop Abdomen: soft, non-tender; bowel sounds normal; no masses,  no organomegaly Pulses: 2+ and symmetric Skin: Skin color, texture, turgor normal. No rashes or lesions Lymph nodes: Cervical, supraclavicular, and axillary nodes normal.  Lab Results  Component Value Date   HGBA1C 5.6 01/23/2022    Lab Results  Component Value Date   CREATININE 0.85 07/24/2022   CREATININE 0.83 01/23/2022   CREATININE 0.85  07/08/2021    Lab Results  Component Value Date   WBC 6.0 07/24/2022   HGB 14.1 07/24/2022   HCT 42.5 07/24/2022   PLT 259.0 07/24/2022   GLUCOSE 92 07/24/2022   CHOL 156 07/24/2022   TRIG 91.0 07/24/2022   HDL 55.80 07/24/2022   LDLDIRECT 139.0 02/01/2016   LDLCALC 82 07/24/2022   ALT 19 07/24/2022   AST 16 07/24/2022   NA 141 07/24/2022   K 3.9 07/24/2022   CL 106 07/24/2022   CREATININE 0.85 07/24/2022   BUN 12 07/24/2022   CO2 26 07/24/2022   TSH 2.28 01/23/2022   HGBA1C 5.6 01/23/2022   MICROALBUR <0.7 01/23/2022    Assessment & Plan:   Problem List Items Addressed This Visit     B12 deficiency    Borderline,  In the setting of acid suppressive therapy. Recommend supplementing       Relevant Orders   Vitamin B12   GERD (gastroesophageal reflux disease)    Previously terated with nexium , now using famotidine to prevent long term related issues.  Symptoms are controlled.        Hyperlipidemia - Primary   Relevant Orders   Lipid panel   Hypertension    Home readings on a validated machine have been consistently < 130/80.  No changes to regimen today       Osteoporosis    There was no change by 2019 DEXA after suspending mediation after  5 years of therapy with bisphosphonates in 2015.  Will repeat in 2024.  Evista intolerant    .       Other Visit Diagnoses     Vitamin D deficiency       Relevant Orders   VITAMIN D 25 Hydroxy (Vit-D Deficiency, Fractures)   Impaired fasting glucose       Relevant Orders   Comprehensive metabolic panel   Hemoglobin A1c       Follow-up: Return in about 6 months (around 01/27/2023).   Crecencio Mc, MD

## 2022-07-29 DIAGNOSIS — E538 Deficiency of other specified B group vitamins: Secondary | ICD-10-CM | POA: Insufficient documentation

## 2022-07-29 NOTE — Assessment & Plan Note (Signed)
There was no change by 2019 DEXA after suspending mediation after  5 years of therapy with bisphosphonates in 2015.  Will repeat in 2024.  Evista intolerant    .

## 2022-07-29 NOTE — Assessment & Plan Note (Signed)
Borderline,  In the setting of acid suppressive therapy. Recommend supplementing

## 2022-07-29 NOTE — Assessment & Plan Note (Signed)
Previously terated with nexium , now using famotidine to prevent long term related issues.  Symptoms are controlled.

## 2022-09-08 ENCOUNTER — Encounter: Payer: Self-pay | Admitting: Internal Medicine

## 2022-09-08 NOTE — Telephone Encounter (Signed)
09/19/22 appointment cancelled per Patient's request due to getting an appointment on 09/11/22

## 2022-09-11 ENCOUNTER — Ambulatory Visit: Payer: Medicare HMO | Admitting: Internal Medicine

## 2022-09-11 ENCOUNTER — Encounter: Payer: Self-pay | Admitting: Internal Medicine

## 2022-09-11 VITALS — BP 150/84 | HR 84 | Temp 98.3°F | Ht 61.0 in | Wt 156.6 lb

## 2022-09-11 DIAGNOSIS — N83202 Unspecified ovarian cyst, left side: Secondary | ICD-10-CM | POA: Insufficient documentation

## 2022-09-11 DIAGNOSIS — R1032 Left lower quadrant pain: Secondary | ICD-10-CM

## 2022-09-11 DIAGNOSIS — M545 Low back pain, unspecified: Secondary | ICD-10-CM | POA: Diagnosis not present

## 2022-09-11 DIAGNOSIS — I10 Essential (primary) hypertension: Secondary | ICD-10-CM | POA: Diagnosis not present

## 2022-09-11 MED ORDER — PREDNISONE 10 MG PO TABS: ORAL_TABLET | ORAL | 0 refills | Status: DC

## 2022-09-11 MED ORDER — TIZANIDINE HCL 4 MG PO TABS: 4.0000 mg | ORAL_TABLET | Freq: Four times a day (QID) | ORAL | 0 refills | Status: DC | PRN

## 2022-09-11 NOTE — Assessment & Plan Note (Signed)
Home readings on a validated machine have been consistently < 130/80.  No changes to regimen today

## 2022-09-11 NOTE — Progress Notes (Signed)
Subjective:  Patient ID: Michele Fernandez, female    DOB: [birth year]  Age: 76 y.o. MRN: [id number]  CC: The primary encounter diagnosis was Colicky LLQ abdominal pain. Diagnoses of Ovarian cyst, left, Acute left-sided low back pain without sciatica, and Primary hypertension were also pertinent to this visit.   HPI Michele Fernandez presents for  Chief Complaint  Patient presents with   Back Pain    Low back pain started 2 weeks ago Dull achy pain Pain radiated from lower back around to left groin   77 yr old white female with intermittent low back pain  caused by Scoliosis. Disc space narrowing at L3-4 and L4-5.( plain films done in 2022) presents with acute episode that started 1 week ago  after pulling a  heavy crate out of a car.  Pain started the following day on the left side.  Severe,  with spasm and pain that radiates to left SI joint.  Started taking expired flexeril.  .  The pain radiates to her left groin, and is described as dull and achy  and aggravated by lying on left side.  Treated constipation and the left groin pain resolved.  Now aggravated by reach and turning certain ways .  Does not radiate beyond left groin anteriorly and left SI joint posteriorly.  No leg weakness,  no foot drop   Has h/o multiple ovarian cysts on the left ,  seen on Feb 2022 ultrasound,     Outpatient Medications Prior to Visit  Medication Sig Dispense Refill   atorvastatin (LIPITOR) 20 MG tablet TAKE 1 TABLET BY MOUTH DAILY 90 tablet 3   Cholecalciferol (VITAMIN D3) 2000 units TABS Take 1 tablet by mouth daily.     famotidine (PEPCID) 40 MG tablet Take 1 tablet (40 mg total) by mouth daily. 90 tablet 1   hydrochlorothiazide (MICROZIDE) 12.5 MG capsule TAKE 1 CAPSULE BY MOUTH DAILY 90 capsule 1   losartan (COZAAR) 50 MG tablet TAKE 1 TABLET BY MOUTH DAILY 90 tablet 1   Magnesium 250 MG TABS 1 tablet with a meal     Probiotic Product (ALIGN) 4 MG CAPS Take 1 capsule by mouth 3 (three) times a  week.     No facility-administered medications prior to visit.    Review of Systems;  Patient denies headache, fevers, malaise, unintentional weight loss, skin rash, eye pain, sinus congestion and sinus pain, sore throat, dysphagia,  hemoptysis , cough, dyspnea, wheezing, chest pain, palpitations, orthopnea, edema, abdominal pain, nausea, melena, diarrhea, constipation, flank pain, dysuria, hematuria, urinary  Frequency, nocturia, numbness, tingling, seizures,  Focal weakness, Loss of consciousness,  Tremor, insomnia, depression, anxiety, and suicidal ideation.      Objective:  BP (!) 150/84 (BP Location: Left Arm, Patient Position: Sitting, Cuff Size: Normal)   Pulse 84   Temp 98.3 F (36.8 C) (Oral)   Ht '5\' 1"'$  (1.549 m)   Wt 156 lb 9.6 oz (71 kg)   SpO2 99%   BMI 29.59 kg/m   BP Readings from Last 3 Encounters:  09/11/22 (!) 150/84  07/27/22 116/78  01/25/22 120/72    Wt Readings from Last 3 Encounters:  09/11/22 156 lb 9.6 oz (71 kg)  07/27/22 159 lb (72.1 kg)  01/25/22 157 lb 3.2 oz (71.3 kg)    General appearance: alert, cooperative and appears stated age Ears: normal TM's and external ear canals both ears Throat: lips, mucosa, and tongue normal; teeth and gums normal Neck: no adenopathy, no carotid  bruit, supple, symmetrical, trachea midline and thyroid not enlarged, symmetric, no tenderness/mass/nodules Back: symmetric, no curvature. ROM normal. No CVA tenderness. Lungs: clear to auscultation bilaterally Heart: regular rate and rhythm, S1, S2 normal, no murmur, click, rub or gallop Abdomen: soft, non-tender; bowel sounds normal; no masses,  no organomegaly Pulses: 2+ and symmetric Skin: Skin color, texture, turgor normal. No rashes or lesions Lymph nodes: Cervical, supraclavicular, and axillary nodes normal. Neuro:  awake and interactive with normal mood and affect. Higher cortical functions are normal. Speech is clear without word-finding difficulty or dysarthria.  Extraocular movements are intact. Visual fields of both eyes are grossly intact. Sensation to light touch is grossly intact bilaterally of upper and lower extremities. Motor examination shows 4+/5 symmetric hand grip and upper extremity and 5/5 lower extremity strength. There is no pronation or drift. Gait is non-ataxic . NEGATIVE STRAIGHT LEG LIFT.   Lab Results  Component Value Date   HGBA1C 5.6 01/23/2022    Lab Results  Component Value Date   CREATININE 0.85 07/24/2022   CREATININE 0.83 01/23/2022   CREATININE 0.85 07/08/2021    Lab Results  Component Value Date   WBC 6.0 07/24/2022   HGB 14.1 07/24/2022   HCT 42.5 07/24/2022   PLT 259.0 07/24/2022   GLUCOSE 92 07/24/2022   CHOL 156 07/24/2022   TRIG 91.0 07/24/2022   HDL 55.80 07/24/2022   LDLDIRECT 139.0 02/01/2016   LDLCALC 82 07/24/2022   ALT 19 07/24/2022   AST 16 07/24/2022   NA 141 07/24/2022   K 3.9 07/24/2022   CL 106 07/24/2022   CREATININE 0.85 07/24/2022   BUN 12 07/24/2022   CO2 26 07/24/2022   TSH 2.28 01/23/2022   HGBA1C 5.6 01/23/2022   MICROALBUR <0.7 01/23/2022    US Pelvic Complete With Transvaginal  Result Date: 01/13/2021 CLINICAL DATA:  Initial evaluation for lower abdominal bloating, history of fibroids. EXAM: TRANSABDOMINAL AND TRANSVAGINAL ULTRASOUND OF PELVIS TECHNIQUE: Both transabdominal and transvaginal ultrasound examinations of the pelvis were performed. Transabdominal technique was performed for global imaging of the pelvis including uterus, ovaries, adnexal regions, and pelvic cul-de-sac. It was necessary to proceed with endovaginal exam following the transabdominal exam to visualize the uterus, endometrium, and ovaries. COMPARISON:  Prior ultrasound from 07/21/2015. FINDINGS: Uterus Measurements: 5.7 x 3.9 x 5.2 cm = volume: 60.6 mL. Uterus is retroverted. 2.7 x 2.6 x 2.8 cm intramural to submucosal fibroid present at the right uterine fundus. This slightly distorts the endometrial  stripe. Additional 1.4 x 1.4 x 1.2 cm intramural fibroid present at the lower uterine segment. 0.9 x 0.8 x 0.8 cm subserosal fibroid present at the left uterine fundus. Endometrium Thickness: 3.4 mm. No focal abnormality visualized. Small amount of simple fluid distends the endometrial cavity. Right ovary Not visualized.  No adnexal mass. Left ovary Measurements: 2.1 x 1.4 x 0.9 cm = volume: 1.4 mL. 1 cm simple cyst noted. No internal complexity, vascularity, or solid nodularity. An additional smaller 7 mm simple cyst noted as well. Other findings No abnormal free fluid. IMPRESSION: 1. Fibroid uterus as above, largest of which measures up to 2.8 cm at the right uterine fundus. 2. Two small simple left ovarian cysts measuring up to 1 cm as above. No followup imaging recommended Note: This recommendation does not apply to premenarchal patients or to those with increased risk (genetic, family history, elevated tumor markers or other high-risk factors) of ovarian cancer. Reference: Radiology 2019 Nov; 293(2):359-371. 3. Nonvisualization of the right ovary. No other  adnexal mass or free fluid. 4. Normal endometrium for age. Small amount of simple fluid distends the endometrial cavity. Electronically Signed   By: Jeannine Boga M.D.   On: 01/13/2021 16:31    Assessment & Plan:   Problem List Items Addressed This Visit     Hypertension    Home readings on a validated machine have been consistently < 130/80.  No changes to regimen today       Ovarian cyst, left    She has persistent LLQ pain that did not resolve with resolution of constipation.  Repeat pelvic ultrasound ordered       Relevant Orders   US Pelvic Complete With Transvaginal   Acute left-sided low back pain    Radiates to left SI joint.  Prednisone taper and zanaflex given.  Tylenol for pain .  Continue PT exercises at home      Relevant Medications   predniSONE (DELTASONE) 10 MG tablet   tiZANidine (ZANAFLEX) 4 MG tablet   Other  Visit Diagnoses     Colicky LLQ abdominal pain    -  Primary   Relevant Orders   US Pelvic Complete With Transvaginal       I spent a total of   minutes with this patient in a face to face visit on the date of this encounter reviewing the last office visit with me in       ,  most recent visit with cardiology ,    ,  patient's diet and exercise habits, home blood pressure /blod sugar readings, recent ER visit including labs and imaging studies ,   and post visit ordering of testing and therapeutics.    Follow-up: No follow-ups on file.   Crecencio Mc, MD

## 2022-09-11 NOTE — Assessment & Plan Note (Signed)
Radiates to left SI joint.  Prednisone taper and zanaflex given.  Tylenol for pain .  Continue PT exercises at home

## 2022-09-11 NOTE — Assessment & Plan Note (Signed)
She has persistent LLQ pain that did not resolve with resolution of constipation.  Repeat pelvic ultrasound ordered

## 2022-09-15 ENCOUNTER — Ambulatory Visit
Admission: RE | Admit: 2022-09-15 | Discharge: 2022-09-15 | Disposition: A | Discharge status: Home / Self Care | Payer: Medicare HMO | Source: Ambulatory Visit | Attending: Internal Medicine | Admitting: Internal Medicine

## 2022-09-15 DIAGNOSIS — N83202 Unspecified ovarian cyst, left side: Secondary | ICD-10-CM | POA: Diagnosis present

## 2022-09-15 DIAGNOSIS — R1032 Left lower quadrant pain: Secondary | ICD-10-CM | POA: Diagnosis present

## 2022-09-19 ENCOUNTER — Ambulatory Visit: Payer: Medicare HMO | Admitting: Internal Medicine

## 2022-09-29 ENCOUNTER — Ambulatory Visit: Payer: Medicare HMO | Admitting: Internal Medicine

## 2022-10-10 ENCOUNTER — Other Ambulatory Visit: Payer: Self-pay | Admitting: Internal Medicine

## 2022-10-10 ENCOUNTER — Other Ambulatory Visit: Payer: Self-pay | Admitting: Family

## 2022-11-01 ENCOUNTER — Ambulatory Visit: Payer: Medicare HMO | Admitting: Internal Medicine

## 2022-11-01 ENCOUNTER — Encounter: Payer: Self-pay | Admitting: Internal Medicine

## 2022-11-01 VITALS — BP 120/70 | HR 79 | Temp 98.1°F | Ht 61.0 in | Wt 156.2 lb

## 2022-11-01 DIAGNOSIS — M7061 Trochanteric bursitis, right hip: Secondary | ICD-10-CM | POA: Diagnosis not present

## 2022-11-01 DIAGNOSIS — M7062 Trochanteric bursitis, left hip: Secondary | ICD-10-CM | POA: Diagnosis not present

## 2022-11-01 DIAGNOSIS — I10 Essential (primary) hypertension: Secondary | ICD-10-CM | POA: Diagnosis not present

## 2022-11-01 DIAGNOSIS — M545 Low back pain, unspecified: Secondary | ICD-10-CM | POA: Diagnosis not present

## 2022-11-01 MED ORDER — PREDNISONE 10 MG PO TABS: ORAL_TABLET | ORAL | 0 refills | Status: DC

## 2022-11-01 MED ORDER — TRAMADOL HCL 50 MG PO TABS: 50.0000 mg | ORAL_TABLET | Freq: Four times a day (QID) | ORAL | 0 refills | Status: AC | PRN

## 2022-11-01 MED ORDER — CYCLOBENZAPRINE HCL 10 MG PO TABS: 10.0000 mg | ORAL_TABLET | Freq: Three times a day (TID) | ORAL | 0 refills | Status: DC | PRN

## 2022-11-01 NOTE — Patient Instructions (Addendum)
  You can add up to 2000 mg of acetominophen (tylenol) every day safely  In divided doses (500 mg every 6 hours  Or 1000 mg every 12 hours.)   Add prednisone taper for the next flare.   Flexeril as needed (3rd  med)   4th med:  add tramadol as needed  Your hip pain is likely from recurrent episodes of trochanteric bursitis , the prednisone will help those flare

## 2022-11-01 NOTE — Assessment & Plan Note (Signed)
Currently resolved.  Reviewed plain films of hips done last year .  Rx prednisone taper for next occurrence

## 2022-11-01 NOTE — Progress Notes (Unsigned)
Subjective:  Patient ID: Michele Fernandez, female    DOB: Dec 15, 1945  Age: 76 y.o. MRN: [id number]  CC: There were no encounter diagnoses.   HPI Michele Fernandez presents for  Chief Complaint  Patient presents with   Acute Visit    Left lower back and groin pain   SEEN OCT 9 and treated for sciatica after pelvic ultrasound rule out left sided ovarian cyst/mass.    Pain  in lower back has improved since last month, using home stretching   but had an episode last week that involved buttock  groin and quads  caused by getting out of the drivers seat, and leading up stairs with left leg.  Hurt to lie on left side . Last night first night without pain.  Denies weakness in left leg.  now radiating to left medial thigh from lower back .  Plain films of lumbar spine from Feb 2022 noted "Scoliosis. Disc space narrowing at L3-4 and L4-5. No fracture or spondylolisthesis. Wants to try therapeutic massage  instead of PT     Outpatient Medications Prior to Visit  Medication Sig Dispense Refill   atorvastatin (LIPITOR) 20 MG tablet TAKE 1 TABLET BY MOUTH DAILY 90 tablet 3   Cholecalciferol (VITAMIN D3) 2000 units TABS Take 1 tablet by mouth daily.     famotidine (PEPCID) 40 MG tablet Take 1 tablet (40 mg total) by mouth daily. 90 tablet 1   hydrochlorothiazide (MICROZIDE) 12.5 MG capsule TAKE 1 CAPSULE BY MOUTH DAILY 90 capsule 1   losartan (COZAAR) 50 MG tablet TAKE 1 TABLET BY MOUTH DAILY 90 tablet 1   Magnesium 250 MG TABS 1 tablet with a meal     Probiotic Product (ALIGN) 4 MG CAPS Take 1 capsule by mouth 3 (three) times a week.     tiZANidine (ZANAFLEX) 4 MG tablet Take 1 tablet (4 mg total) by mouth every 6 (six) hours as needed for muscle spasms. 30 tablet 0   predniSONE (DELTASONE) 10 MG tablet 6 tablets on Day 1 , then reduce by 1 tablet daily until gone (Patient not taking: Reported on 11/01/2022) 21 tablet 0   No facility-administered medications prior to visit.    Review of  Systems;  Patient denies headache, fevers, malaise, unintentional weight loss, skin rash, eye pain, sinus congestion and sinus pain, sore throat, dysphagia,  hemoptysis , cough, dyspnea, wheezing, chest pain, palpitations, orthopnea, edema, abdominal pain, nausea, melena, diarrhea, constipation, flank pain, dysuria, hematuria, urinary  Frequency, nocturia, numbness, tingling, seizures,  Focal weakness, Loss of consciousness,  Tremor, insomnia, depression, anxiety, and suicidal ideation.      Objective:  BP (!) 150/84   Pulse 79   Temp 98.1 F (36.7 C) (Oral)   Ht '5\' 1"'$  (1.549 m)   Wt 156 lb 3.2 oz (70.9 kg)   SpO2 98%   BMI 29.51 kg/m   BP Readings from Last 3 Encounters:  11/01/22 (!) 150/84  09/11/22 (!) 150/84  07/27/22 116/78    Wt Readings from Last 3 Encounters:  11/01/22 156 lb 3.2 oz (70.9 kg)  09/11/22 156 lb 9.6 oz (71 kg)  07/27/22 159 lb (72.1 kg)    General appearance: alert, cooperative and appears stated age Ears: normal TM's and external ear canals both ears Throat: lips, mucosa, and tongue normal; teeth and gums normal Neck: no adenopathy, no carotid bruit, supple, symmetrical, trachea midline and thyroid not enlarged, symmetric, no tenderness/mass/nodules Back: symmetric, no curvature. ROM normal. No CVA tenderness.  Lungs: clear to auscultation bilaterally Heart: regular rate and rhythm, S1, S2 normal, no murmur, click, rub or gallop Abdomen: soft, non-tender; bowel sounds normal; no masses,  no organomegaly Pulses: 2+ and symmetric Skin: Skin color, texture, turgor normal. No rashes or lesions Lymph nodes: Cervical, supraclavicular, and axillary nodes normal. Neuro:  awake and interactive with normal mood and affect. Higher cortical functions are normal. Speech is clear without word-finding difficulty or dysarthria. Extraocular movements are intact. Visual fields of both eyes are grossly intact. Sensation to light touch is grossly intact bilaterally of upper  and lower extremities. Motor examination shows 4+/5 symmetric hand grip and upper extremity and 5/5 lower extremity strength. There is no pronation or drift. Gait is non-ataxic   Lab Results  Component Value Date   HGBA1C 5.6 01/23/2022    Lab Results  Component Value Date   CREATININE 0.85 07/24/2022   CREATININE 0.83 01/23/2022   CREATININE 0.85 07/08/2021    Lab Results  Component Value Date   WBC 6.0 07/24/2022   HGB 14.1 07/24/2022   HCT 42.5 07/24/2022   PLT 259.0 07/24/2022   GLUCOSE 92 07/24/2022   CHOL 156 07/24/2022   TRIG 91.0 07/24/2022   HDL 55.80 07/24/2022   LDLDIRECT 139.0 02/01/2016   LDLCALC 82 07/24/2022   ALT 19 07/24/2022   AST 16 07/24/2022   NA 141 07/24/2022   K 3.9 07/24/2022   CL 106 07/24/2022   CREATININE 0.85 07/24/2022   BUN 12 07/24/2022   CO2 26 07/24/2022   TSH 2.28 01/23/2022   HGBA1C 5.6 01/23/2022   MICROALBUR <0.7 01/23/2022    US Pelvic Complete With Transvaginal  Result Date: 09/15/2022 CLINICAL DATA:  New onset LEFT lower quadrant pain, history of multiple LEFT ovarian cysts, postmenopausal EXAM: TRANSABDOMINAL AND TRANSVAGINAL ULTRASOUND OF PELVIS TECHNIQUE: Both transabdominal and transvaginal ultrasound examinations of the pelvis were performed. Transabdominal technique was performed for global imaging of the pelvis including uterus, ovaries, adnexal regions, and pelvic cul-de-sac. It was necessary to proceed with endovaginal exam following the transabdominal exam to visualize the uterus, endometrium, and ovaries. COMPARISON:  01/13/2021 FINDINGS: Uterus Measurements: 5.8 x 5.0 x 3.5 cm = volume: 53 mL. Retroverted. Submucosal leiomyoma anterior uterus 2.7 x 2.9 x 2.5 cm. Additional submucosal leiomyoma mid uterus anteriorly 1.4 x 1.8 x 1.6 cm. Submucosal leiomyoma at fundus 1.2 x 1.5 x 1.3 cm. Endometrium Thickness: 2 mm. Moderate amount of endometrial fluid, nonspecific. No discrete mass. Right ovary Measurements: 1.3 x 0.8 x 1.4  cm = volume: 0.8 mL. Normal morphology without mass Left ovary Measurements: 1.3 x 1.4 x 0.9 cm = volume: 0.9 mL. 8 mm cyst LEFT ovary; No followup imaging recommended. Note: This recommendation does not apply to premenarchal patients or to those with increased risk (genetic, family history, elevated tumor markers or other high-risk factors) of ovarian cancer. Reference: Radiology 2019 Nov; 293(2):359-371. Other findings No free pelvic fluid or adnexal masses. IMPRESSION: Multiple submucosal uterine leiomyomata. Moderate amount of nonspecific endometrial fluid. Remainder of exam unremarkable. Electronically Signed   By: Lavonia Dana M.D.   On: 09/15/2022 16:13    Assessment & Plan:   Problem List Items Addressed This Visit   None   I spent a total of   minutes with this patient in a face to face visit on the date of this encounter reviewing the last office visit with me in       ,  most recent visit with cardiology ,    ,  patient's diet and exercise habits, home blood pressure /blod sugar readings, recent ER visit including labs and imaging studies ,   and post visit ordering of testing and therapeutics.    Follow-up: No follow-ups on file.   Crecencio Mc, MD

## 2022-11-01 NOTE — Assessment & Plan Note (Signed)
Elvated in office due to [facility]. Home readings have been consistently 120/70 or lower.  No changes today

## 2022-11-02 ENCOUNTER — Encounter: Payer: Self-pay | Admitting: Internal Medicine

## 2022-11-02 DIAGNOSIS — M7061 Trochanteric bursitis, right hip: Secondary | ICD-10-CM | POA: Insufficient documentation

## 2022-11-02 NOTE — Assessment & Plan Note (Signed)
Improving with stretcing and massage.    Tylenol for pain .  Continue PT exercises at home

## 2023-01-14 IMAGING — US US PELVIS COMPLETE WITH TRANSVAGINAL
1 series · 13 of 25 positions shown · non-contrast
Comparison: Prior ultrasound from 07/21/2015.

CLINICAL DATA: Initial evaluation for lower abdominal bloating,
history of fibroids.



[Series 1: us pelvis complete with transvaginal · 0.24mm/px · 13 of 72 slices shown]
[im 1/72]
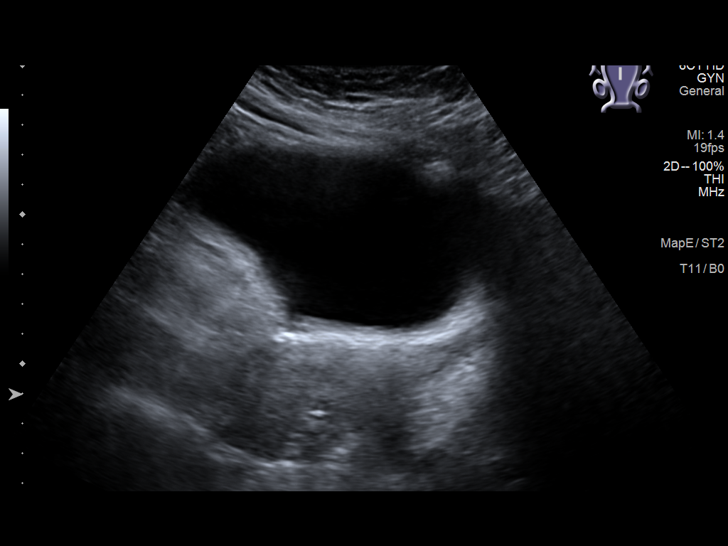
[im 6/72]
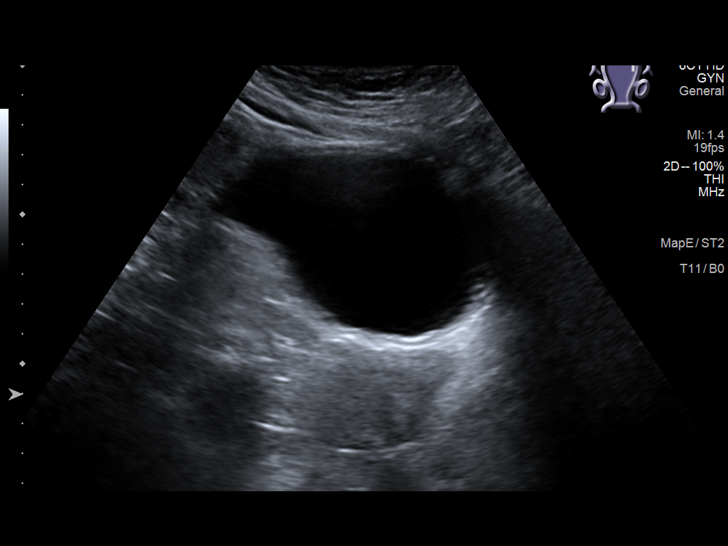
[im 12/72]
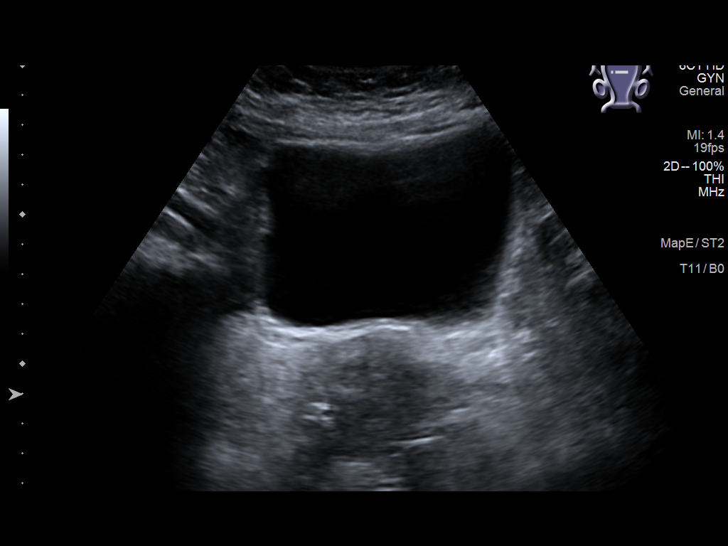
[im 18/72]
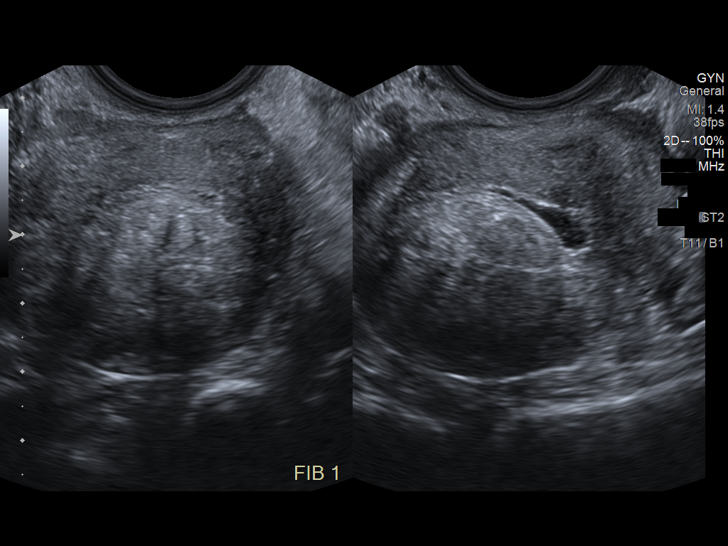
[im 24/72]
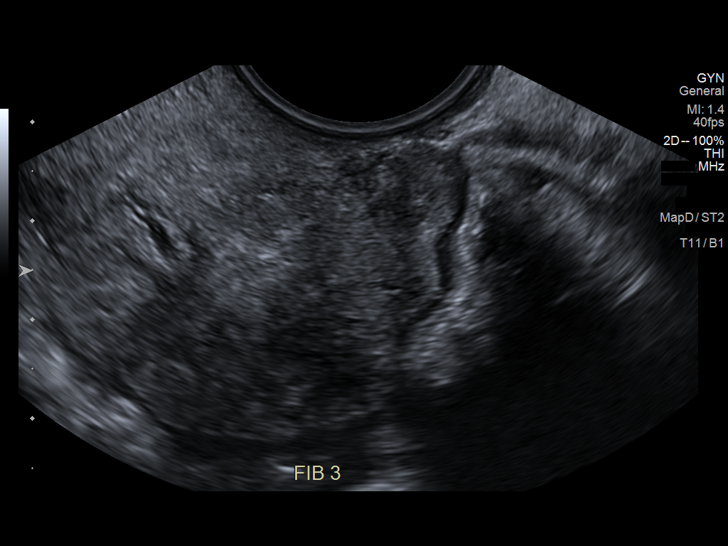
[im 30/72]
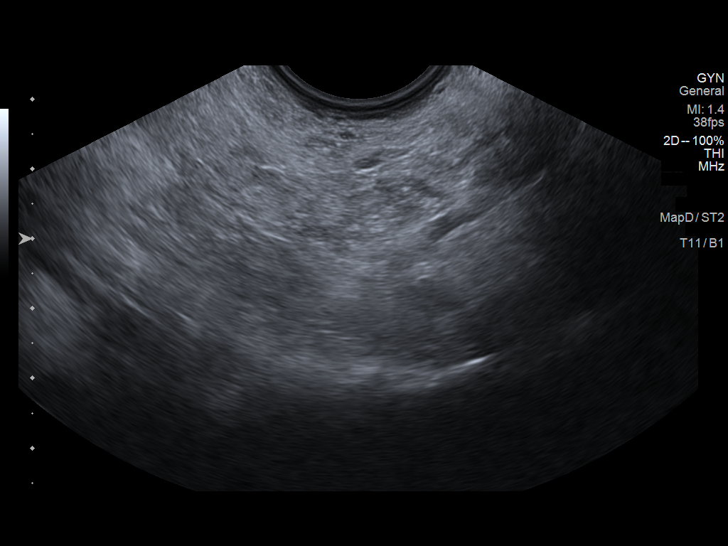
[im 36/72]
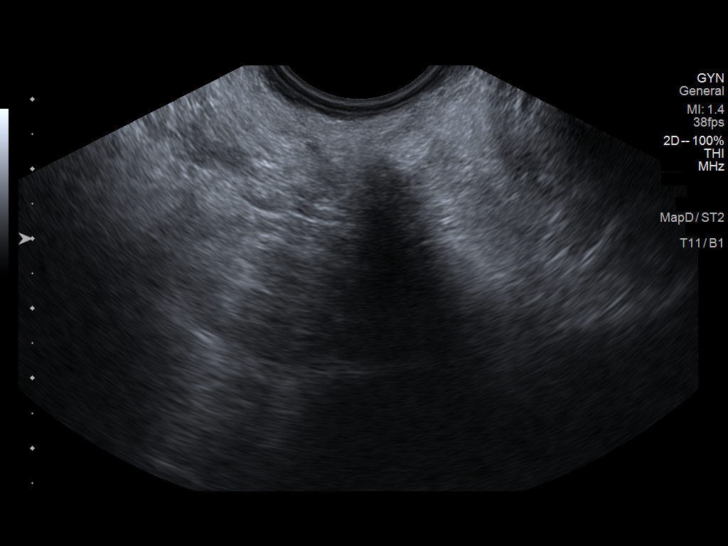
[im 42/72]
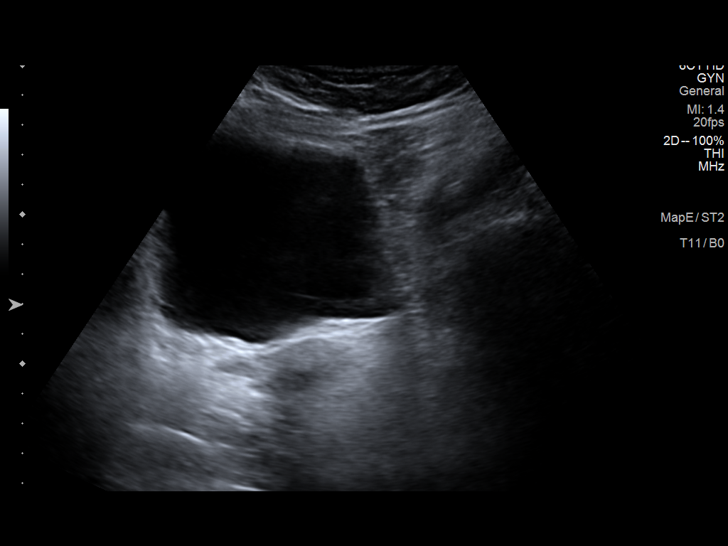
[im 48/72]
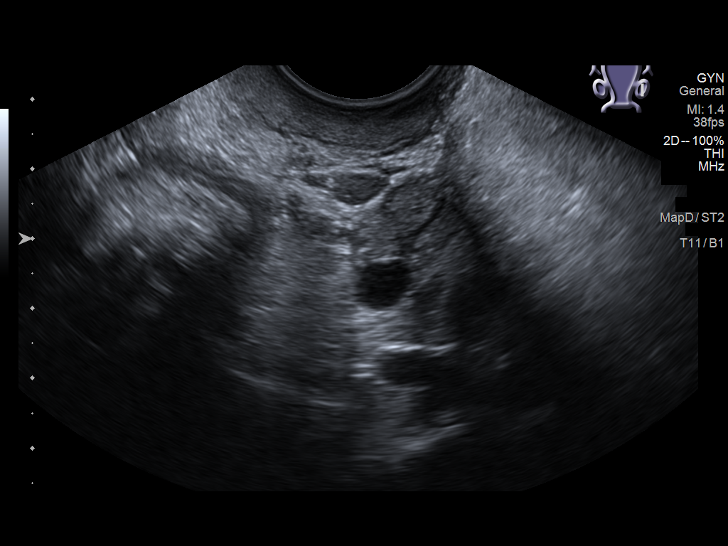
[im 54/72]
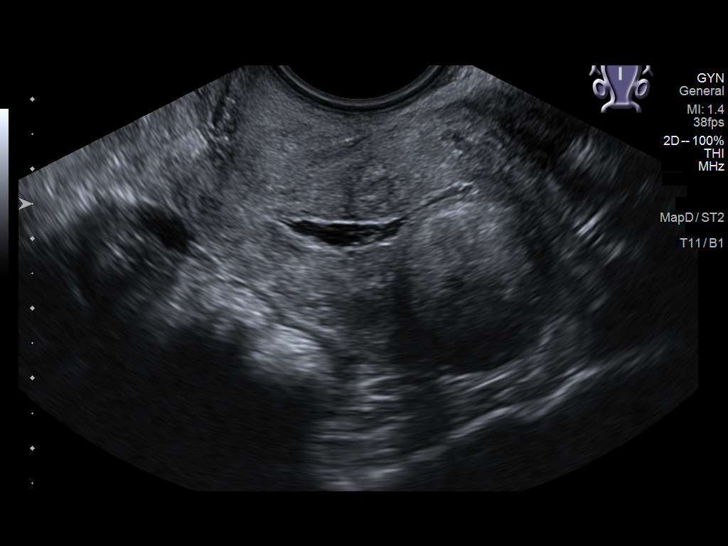
[im 60/72]
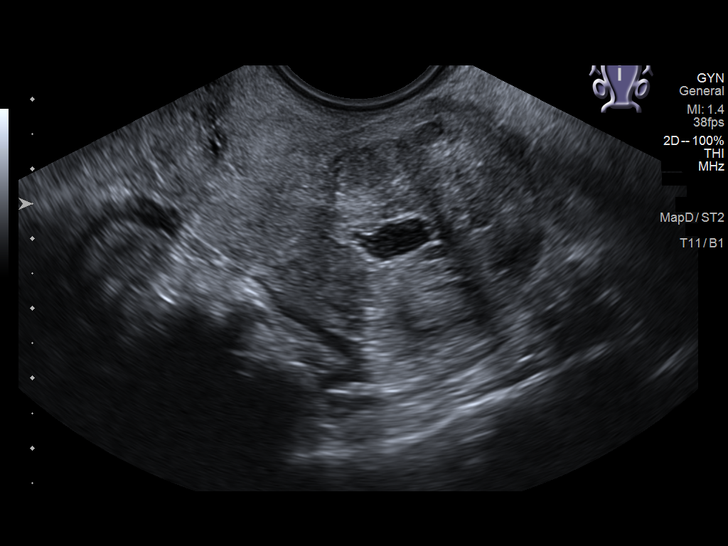
[im 66/72]
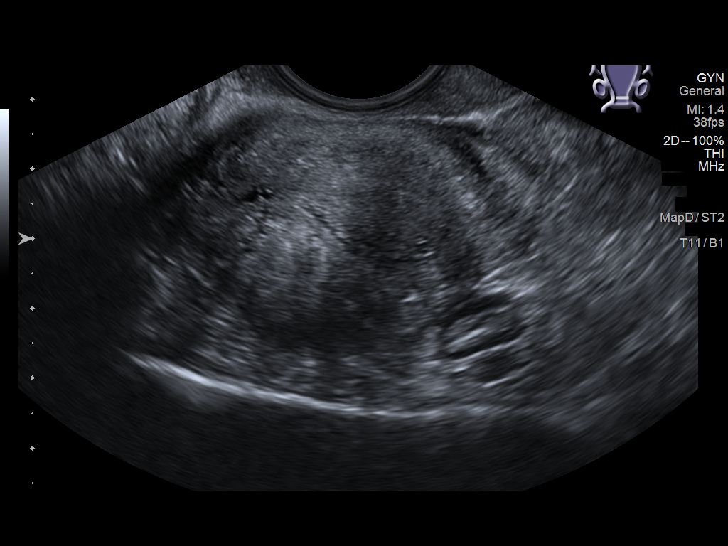
[im 72/72]
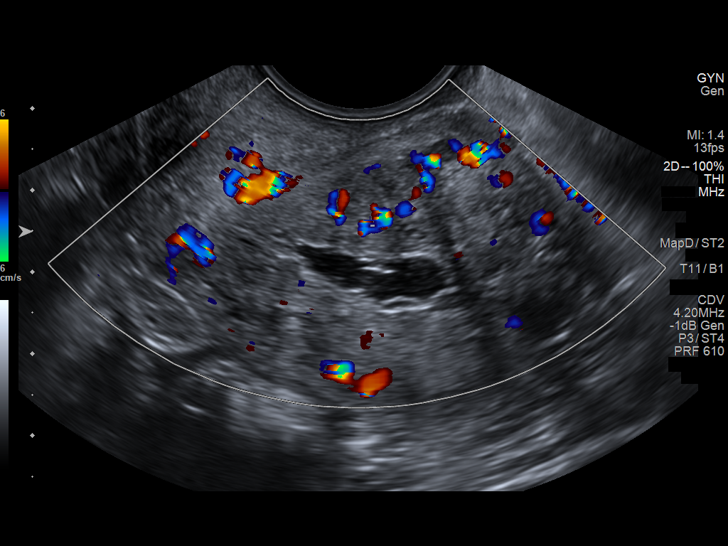

[13 of 25 positions shown; findings below may reference images not displayed]

FINDINGS: Uterus

Measurements: 5.7 x 3.9 x 5.2 cm = volume: 60.6 mL. Uterus is
retroverted. 2.7 x 2.6 x 2.8 cm intramural to submucosal fibroid
present at the right uterine fundus. This slightly distorts the
endometrial stripe. Additional 1.4 x 1.4 x 1.2 cm intramural fibroid
present at the lower uterine segment. 0.9 x 0.8 x 0.8 cm subserosal
fibroid present at the left uterine fundus.

Endometrium

Thickness: 3.4 mm. No focal abnormality visualized. Small amount of
simple fluid distends the endometrial cavity.

Right ovary

Not visualized.  No adnexal mass.

Left ovary

Measurements: 2.1 x 1.4 x 0.9 cm = volume: 1.4 mL. 1 cm simple cyst
noted. No internal complexity, vascularity, or solid nodularity. An
additional smaller 7 mm simple cyst noted as well.

Other findings

No abnormal free fluid.
IMPRESSION: 1. Fibroid uterus as above, largest of which measures up to 2.8 cm
at the right uterine fundus.
2. Two small simple left ovarian cysts measuring up to 1 cm as
above. No followup imaging recommended Note: This recommendation
does not apply to premenarchal patients or to those with increased
risk (genetic, family history, elevated tumor markers or other
high-risk factors) of ovarian cancer. Reference: Radiology [DATE]):359-371.
3. Nonvisualization of the right ovary. No other adnexal mass or
free fluid.
4. Normal endometrium for age. Small amount of simple fluid distends
the endometrial cavity.

## 2023-01-19 ENCOUNTER — Encounter: Payer: Self-pay | Admitting: Internal Medicine

## 2023-01-19 NOTE — Telephone Encounter (Signed)
There is some future labs already ordered is there anything else that needs to be ordered?

## 2023-01-24 ENCOUNTER — Other Ambulatory Visit (INDEPENDENT_AMBULATORY_CARE_PROVIDER_SITE_OTHER): Payer: Medicare HMO

## 2023-01-24 DIAGNOSIS — E538 Deficiency of other specified B group vitamins: Secondary | ICD-10-CM | POA: Diagnosis not present

## 2023-01-24 DIAGNOSIS — E559 Vitamin D deficiency, unspecified: Secondary | ICD-10-CM | POA: Diagnosis not present

## 2023-01-24 DIAGNOSIS — R7301 Impaired fasting glucose: Secondary | ICD-10-CM | POA: Diagnosis not present

## 2023-01-24 DIAGNOSIS — E782 Mixed hyperlipidemia: Secondary | ICD-10-CM | POA: Diagnosis not present

## 2023-01-24 LAB — VITAMIN B12: Vitamin B-12: 414 pg/mL (ref 211–911)

## 2023-01-24 LAB — VITAMIN D 25 HYDROXY (VIT D DEFICIENCY, FRACTURES): VITD: 39.53 ng/mL (ref 30.00–100.00)

## 2023-01-24 LAB — HEMOGLOBIN A1C: Hgb A1c MFr Bld: 5.6 % (ref 4.6–6.5)

## 2023-01-25 LAB — COMPREHENSIVE METABOLIC PANEL
ALT: 24 U/L (ref 0–35)
AST: 21 U/L (ref 0–37)
Albumin: 4.2 g/dL (ref 3.5–5.2)
Alkaline Phosphatase: 61 U/L (ref 39–117)
BUN: 13 mg/dL (ref 6–23)
CO2: 24 mEq/L (ref 19–32)
Calcium: 9.4 mg/dL (ref 8.4–10.5)
Chloride: 107 mEq/L (ref 96–112)
Creatinine, Ser: 0.89 mg/dL (ref 0.40–1.20)
GFR: 63.91 mL/min (ref >60.00)
Glucose, Bld: 87 mg/dL (ref 70–99)
Potassium: 4.1 mEq/L (ref 3.5–5.1)
Sodium: 144 mEq/L (ref 135–145)
Total Bilirubin: 0.6 mg/dL (ref 0.2–1.2)
Total Protein: 6.2 g/dL (ref 6.0–8.3)

## 2023-01-25 LAB — LIPID PANEL
Cholesterol: 161 mg/dL (ref 0–200)
HDL: 62.4 mg/dL (ref >39.00)
LDL Cholesterol: 82 mg/dL (ref 0–99)
NonHDL: 98.98
Total CHOL/HDL Ratio: 3
Triglycerides: 86 mg/dL (ref 0.0–149.0)
VLDL: 17.2 mg/dL (ref 0.0–40.0)

## 2023-01-29 ENCOUNTER — Ambulatory Visit (INDEPENDENT_AMBULATORY_CARE_PROVIDER_SITE_OTHER): Payer: Medicare HMO | Admitting: Internal Medicine

## 2023-01-29 ENCOUNTER — Encounter: Payer: Self-pay | Admitting: Internal Medicine

## 2023-01-29 VITALS — BP 120/68 | HR 82 | Temp 97.9°F | Ht 61.0 in | Wt 158.2 lb

## 2023-01-29 DIAGNOSIS — E538 Deficiency of other specified B group vitamins: Secondary | ICD-10-CM

## 2023-01-29 DIAGNOSIS — I10 Essential (primary) hypertension: Secondary | ICD-10-CM | POA: Diagnosis not present

## 2023-01-29 DIAGNOSIS — M7062 Trochanteric bursitis, left hip: Secondary | ICD-10-CM

## 2023-01-29 DIAGNOSIS — Z1231 Encounter for screening mammogram for malignant neoplasm of breast: Secondary | ICD-10-CM

## 2023-01-29 DIAGNOSIS — M25552 Pain in left hip: Secondary | ICD-10-CM

## 2023-01-29 DIAGNOSIS — Z0000 Encounter for general adult medical examination without abnormal findings: Secondary | ICD-10-CM | POA: Diagnosis not present

## 2023-01-29 DIAGNOSIS — E782 Mixed hyperlipidemia: Secondary | ICD-10-CM

## 2023-01-29 DIAGNOSIS — Z1211 Encounter for screening for malignant neoplasm of colon: Secondary | ICD-10-CM | POA: Diagnosis not present

## 2023-01-29 DIAGNOSIS — G8929 Other chronic pain: Secondary | ICD-10-CM

## 2023-01-29 MED ORDER — HYDROCHLOROTHIAZIDE 12.5 MG PO CAPS: 12.5000 mg | ORAL_CAPSULE | Freq: Every day | ORAL | 1 refills | Status: DC

## 2023-01-29 MED ORDER — LOSARTAN POTASSIUM 50 MG PO TABS: 50.0000 mg | ORAL_TABLET | Freq: Every day | ORAL | 1 refills | Status: DC

## 2023-01-29 MED ORDER — OMEPRAZOLE 20 MG PO CPDR: 20.0000 mg | DELAYED_RELEASE_CAPSULE | Freq: Every day | ORAL | 1 refills | Status: DC

## 2023-01-29 NOTE — Assessment & Plan Note (Signed)
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 Resulting in recurrent bursitis.  Ortho referral made

## 2023-01-29 NOTE — Assessment & Plan Note (Signed)

## 2023-01-29 NOTE — Assessment & Plan Note (Addendum)
Elvated in office due to [facility]. Home readings have been consistently 120/70 or lower.  No changes today.  Continue losasrta  Lab Results  Component Value Date   CREATININE 0.89 01/24/2023   Lab Results  Component Value Date   NA 144 01/24/2023   K 4.1 01/24/2023   CL 107 01/24/2023   CO2 24 01/24/2023

## 2023-01-29 NOTE — Assessment & Plan Note (Signed)
Resolved with oral  b12

## 2023-01-29 NOTE — Progress Notes (Signed)
Patient ID: Michele Fernandez, female    DOB: Jan 21, 1946  Age: 77 y.o. MRN: [id number]  The patient is here for annual preventive examination and management of other chronic and acute problems.   The risk factors are reflected in the social history.   The roster of all physicians providing medical care to patient - is listed in the Snapshot section of the chart.   Activities of daily living:  The patient is 100% independent in all ADLs: dressing, toileting, feeding as well as independent mobility   Home safety : The patient has smoke detectors in the home. They wear seatbelts.  There are no unsecured firearms at home. There is no violence in the home.    There is no risks for hepatitis, STDs or HIV. There is no   history of blood transfusion. They have no travel history to infectious disease endemic areas of the world.   The patient has seen their dentist in the last six month. They have seen their eye doctor in the last year. The patinet  denies slight hearing difficulty with regard to whispered voices and some television programs.  They have deferred audiologic testing in the last year.  They do not  have excessive sun exposure. Discussed the need for sun protection: hats, long sleeves and use of sunscreen if there is significant sun exposure.    Diet: the importance of a healthy diet is discussed. They do have a healthy diet.   The benefits of regular aerobic exercise were discussed. The patient  exercises  3 to 5 days per week  for  60 minutes.    Depression screen: there are no signs or vegative symptoms of depression- irritability, change in appetite, anhedonia, sadness/tearfullness.   The following portions of the patient's history were reviewed and updated as appropriate: allergies, current medications, past family history, past medical history,  past surgical history, past social history  and problem list.   Visual acuity was not assessed per patient preference since the patient has  regular follow up with an  ophthalmologist. Hearing and body mass index were assessed and reviewed.    During the course of the visit the patient was educated and counseled about appropriate screening and preventive services including : fall prevention , diabetes screening, nutrition counseling, colorectal cancer screening, and recommended immunizations.    Chief Complaint:  1) GERD:  symptos improving on famotidine 40 mg and pantoprazole 40 mg . Wants to reduce dose.   2) HTN:  Patient is taking her medications as prescribed and notes no adverse effects.  Home BP readings have been done about once per week and are  generally < 130/80 .  She is avoiding added salt in her diet and walking regularly about 5 times per week for exercise  .   3) left hip pain improving  PREDNISONE taper given in  [place].   HELPED FOR 4 DAYS .  Orthopedic evaluation requested   Review of Symptoms  Patient denies headache, fevers, malaise, unintentional weight loss, skin rash, eye pain, sinus congestion and sinus pain, sore throat, dysphagia,  hemoptysis , cough, dyspnea, wheezing, chest pain, palpitations, orthopnea, edema, abdominal pain, nausea, melena, diarrhea, constipation, flank pain, dysuria, hematuria, urinary  Frequency, nocturia, numbness, tingling, seizures,  Focal weakness, Loss of consciousness,  Tremor, insomnia, depression, anxiety, and suicidal ideation.    Physical Exam:  BP 120/68   Pulse 82   Temp 97.9 F (36.6 C) (Oral)   Ht '5\' 1"'$  (1.549 m)  Wt 158 lb 3.2 oz (71.8 kg)   SpO2 97%   BMI 29.89 kg/m    Physical Exam Vitals reviewed.  Constitutional:      General: She is not in acute distress.    Appearance: Normal appearance. She is well-developed and normal weight. She is not ill-appearing, toxic-appearing or diaphoretic.  HENT:     Head: Normocephalic.     Right Ear: Tympanic membrane, ear canal and external ear normal. There is no impacted cerumen.     Left Ear: Tympanic membrane,  ear canal and external ear normal. There is no impacted cerumen.     Nose: Nose normal.     Mouth/Throat:     Mouth: Mucous membranes are moist.     Pharynx: Oropharynx is clear.  Eyes:     General: No scleral icterus.       Right eye: No discharge.        Left eye: No discharge.     Conjunctiva/sclera: Conjunctivae normal.     Pupils: Pupils are equal, round, and reactive to light.  Neck:     Thyroid: No thyromegaly.     Vascular: No carotid bruit or JVD.  Cardiovascular:     Rate and Rhythm: Normal rate and regular rhythm.     Heart sounds: Normal heart sounds.  Pulmonary:     Effort: Pulmonary effort is normal. No respiratory distress.     Breath sounds: Normal breath sounds.  Chest:  Breasts:    Breasts are symmetrical.     Right: Normal. No swelling, inverted nipple, mass, nipple discharge, skin change or tenderness.     Left: Normal. No swelling, inverted nipple, mass, nipple discharge, skin change or tenderness.  Abdominal:     General: Bowel sounds are normal.     Palpations: Abdomen is soft. There is no mass.     Tenderness: There is no abdominal tenderness. There is no guarding or rebound.  Musculoskeletal:        General: Normal range of motion.     Cervical back: Normal range of motion and neck supple.  Lymphadenopathy:     Cervical: No cervical adenopathy.     Upper Body:     Right upper body: No supraclavicular, axillary or pectoral adenopathy.     Left upper body: No supraclavicular, axillary or pectoral adenopathy.  Skin:    General: Skin is warm and dry.  Neurological:     General: No focal deficit present.     Mental Status: She is alert and oriented to person, place, and time. Mental status is at baseline.  Psychiatric:        Mood and Affect: Mood normal.        Behavior: Behavior normal.        Thought Content: Thought content normal.        Judgment: Judgment normal.     Assessment and Plan: Encounter for preventive health  examination Assessment & Plan: age appropriate education and counseling updated, referrals for preventative services and immunizations addressed, dietary and smoking counseling addressed, most recent labs reviewed.  I have personally reviewed and have noted:   1) the patient's medical and social history 2) The pt's use of alcohol, tobacco, and illicit drugs 3) The patient's current medications and supplements 4) Functional ability including ADL's, fall risk, home safety risk, hearing and visual impairment 5) Diet and physical activities 6) Evidence for depression or mood disorder 7) The patient's height, weight, and BMI have been recorded in the chart  I have made referrals, and provided counseling and education based on review of the above    Colon cancer screening -     Cologuard  Encounter for screening mammogram for malignant neoplasm of breast -     3D Screening Mammogram, Left and Right; Future  Primary hypertension Assessment & Plan: Elvated in office due to [facility]. Home readings have been consistently 120/70 or lower.  No changes today.  Continue losasrta  Lab Results  Component Value Date   CREATININE 0.89 01/24/2023   Lab Results  Component Value Date   NA 144 01/24/2023   K 4.1 01/24/2023   CL 107 01/24/2023   CO2 24 01/24/2023     Orders: -     hydroCHLOROthiazide; Take 1 capsule (12.5 mg total) by mouth daily.  Dispense: 90 capsule; Refill: 1 -     Losartan Potassium; Take 1 tablet (50 mg total) by mouth daily.  Dispense: 90 tablet; Refill: 1  Chronic left hip pain Assessment & Plan: She has mild degenerative changes with loss of cartilage as well as bony overgrowth at the s/l aspect of the acetabulum . Resulting in recurrent bursitis.  Ortho referral made   Orders: -     Ambulatory referral to Orthopedic Surgery  B12 deficiency Assessment & Plan: Resolved with oral  b12   Mixed hyperlipidemia Assessment & Plan: With aortic atherosclerosis noted on  lumbar spine films.  Improved result with  atorvastatin ,  Goal LDL  is near  70  Lab Results  Component Value Date   CHOL 161 01/24/2023   HDL 62.40 01/24/2023   LDLCALC 82 01/24/2023   LDLDIRECT 139.0 02/01/2016   TRIG 86.0 01/24/2023   CHOLHDL 3 01/24/2023   Lab Results  Component Value Date   ALT 24 01/24/2023   AST 21 01/24/2023   ALKPHOS 61 01/24/2023   BILITOT 0.6 01/24/2023      Trochanteric bursitis of left hip Assessment & Plan: Referring to orthopedics for evaluation  wants to avoid surgery    Other orders -     Omeprazole; Take 1 capsule (20 mg total) by mouth daily.  Dispense: 90 capsule; Refill: 1    Return in about 6 months (around 07/30/2023) for hypertension.  Crecencio Mc, MD

## 2023-01-29 NOTE — Assessment & Plan Note (Signed)
Referring to orthopedics for evaluation  wants to avoid surgery

## 2023-01-29 NOTE — Assessment & Plan Note (Signed)
With aortic atherosclerosis noted on lumbar spine films.  Improved result with  atorvastatin ,  Goal LDL  is near  70  Lab Results  Component Value Date   CHOL 161 01/24/2023   HDL 62.40 01/24/2023   LDLCALC 82 01/24/2023   LDLDIRECT 139.0 02/01/2016   TRIG 86.0 01/24/2023   CHOLHDL 3 01/24/2023   Lab Results  Component Value Date   ALT 24 01/24/2023   AST 21 01/24/2023   ALKPHOS 61 01/24/2023   BILITOT 0.6 01/24/2023

## 2023-01-29 NOTE — Patient Instructions (Addendum)
Annual Wellness Visit is due, please schedule this at checkout.     I will initiate the order for your  Cologuard.  It will be delivered to your house, and you will send off a stool sample in the envelope it provides.

## 2023-02-02 ENCOUNTER — Other Ambulatory Visit: Payer: Self-pay

## 2023-02-02 ENCOUNTER — Encounter: Payer: Self-pay | Admitting: Internal Medicine

## 2023-02-02 MED ORDER — FAMOTIDINE 40 MG PO TABS: 40.0000 mg | ORAL_TABLET | Freq: Every day | ORAL | 1 refills | Status: DC

## 2023-02-05 ENCOUNTER — Telehealth: Payer: Self-pay | Admitting: Internal Medicine

## 2023-02-05 NOTE — Telephone Encounter (Signed)
Patient called and scheduled her CPE for next year but also said that she comes every 6 months for labs and wanted to schedule labs for August but there aren't any orders in. Is this okay to schedule

## 2023-02-05 NOTE — Telephone Encounter (Signed)
Patient called back and said disregard, She said it was supposed to just be for an office visit so I scheduled

## 2023-02-05 NOTE — Telephone Encounter (Signed)
noted 

## 2023-02-14 LAB — COLOGUARD: COLOGUARD: NEGATIVE

## 2023-05-05 ENCOUNTER — Other Ambulatory Visit: Payer: Self-pay | Admitting: Internal Medicine

## 2023-05-05 DIAGNOSIS — I10 Essential (primary) hypertension: Secondary | ICD-10-CM

## 2023-07-19 ENCOUNTER — Encounter (INDEPENDENT_AMBULATORY_CARE_PROVIDER_SITE_OTHER): Payer: Self-pay

## 2023-08-02 ENCOUNTER — Ambulatory Visit: Payer: Medicare HMO | Admitting: Internal Medicine

## 2023-08-02 ENCOUNTER — Encounter: Payer: Self-pay | Admitting: Internal Medicine

## 2023-08-02 DIAGNOSIS — I10 Essential (primary) hypertension: Secondary | ICD-10-CM

## 2023-08-02 DIAGNOSIS — R7301 Impaired fasting glucose: Secondary | ICD-10-CM

## 2023-08-02 DIAGNOSIS — E782 Mixed hyperlipidemia: Secondary | ICD-10-CM

## 2023-08-02 DIAGNOSIS — R5383 Other fatigue: Secondary | ICD-10-CM

## 2023-08-02 NOTE — Telephone Encounter (Signed)
I have pended labs for your approval.

## 2023-08-08 ENCOUNTER — Other Ambulatory Visit (INDEPENDENT_AMBULATORY_CARE_PROVIDER_SITE_OTHER): Payer: Medicare HMO

## 2023-08-08 DIAGNOSIS — I10 Essential (primary) hypertension: Secondary | ICD-10-CM | POA: Diagnosis not present

## 2023-08-08 DIAGNOSIS — R5383 Other fatigue: Secondary | ICD-10-CM

## 2023-08-08 DIAGNOSIS — R7301 Impaired fasting glucose: Secondary | ICD-10-CM

## 2023-08-08 DIAGNOSIS — E782 Mixed hyperlipidemia: Secondary | ICD-10-CM

## 2023-08-08 LAB — LDL CHOLESTEROL, DIRECT: Direct LDL: 92 mg/dL

## 2023-08-08 LAB — CBC WITH DIFFERENTIAL/PLATELET
Basophils Absolute: 0 10*3/uL (ref 0.0–0.1)
Basophils Relative: 0.5 % (ref 0.0–3.0)
Eosinophils Absolute: 0.1 10*3/uL (ref 0.0–0.7)
Eosinophils Relative: 2 % (ref 0.0–5.0)
HCT: 44.1 % (ref 36.0–46.0)
Hemoglobin: 14.3 g/dL (ref 12.0–15.0)
Lymphocytes Relative: 30.9 % (ref 12.0–46.0)
Lymphs Abs: 1.9 10*3/uL (ref 0.7–4.0)
MCHC: 32.5 g/dL (ref 30.0–36.0)
MCV: 88.7 fl (ref 78.0–100.0)
Monocytes Absolute: 0.5 10*3/uL (ref 0.1–1.0)
Monocytes Relative: 7.8 % (ref 3.0–12.0)
Neutro Abs: 3.6 10*3/uL (ref 1.4–7.7)
Neutrophils Relative %: 58.8 % (ref 43.0–77.0)
Platelets: 289 10*3/uL (ref 150.0–400.0)
RBC: 4.97 Mil/uL (ref 3.87–5.11)
RDW: 13.7 % (ref 11.5–15.5)
WBC: 6.1 10*3/uL (ref 4.0–10.5)

## 2023-08-08 LAB — LIPID PANEL
Cholesterol: 154 mg/dL (ref 0–200)
HDL: 55.1 mg/dL (ref >39.00)
LDL Cholesterol: 80 mg/dL (ref 0–99)
NonHDL: 98.63
Total CHOL/HDL Ratio: 3
Triglycerides: 93 mg/dL (ref 0.0–149.0)
VLDL: 18.6 mg/dL (ref 0.0–40.0)

## 2023-08-08 LAB — COMPREHENSIVE METABOLIC PANEL
ALT: 38 U/L — ABNORMAL HIGH (ref 0–35)
AST: 25 U/L (ref 0–37)
Albumin: 4 g/dL (ref 3.5–5.2)
Alkaline Phosphatase: 62 U/L (ref 39–117)
BUN: 13 mg/dL (ref 6–23)
CO2: 27 meq/L (ref 19–32)
Calcium: 9.1 mg/dL (ref 8.4–10.5)
Chloride: 107 meq/L (ref 96–112)
Creatinine, Ser: 0.83 mg/dL (ref 0.40–1.20)
GFR: 69.23 mL/min (ref >60.00)
Glucose, Bld: 83 mg/dL (ref 70–99)
Potassium: 4 meq/L (ref 3.5–5.1)
Sodium: 142 meq/L (ref 135–145)
Total Bilirubin: 0.7 mg/dL (ref 0.2–1.2)
Total Protein: 6.6 g/dL (ref 6.0–8.3)

## 2023-08-08 LAB — MICROALBUMIN / CREATININE URINE RATIO
Creatinine,U: 40 mg/dL
Microalb Creat Ratio: 1.7 mg/g (ref 0.0–30.0)
Microalb, Ur: <0.7 mg/dL (ref 0.0–1.9)

## 2023-08-08 LAB — HEMOGLOBIN A1C: Hgb A1c MFr Bld: 5.6 % (ref 4.6–6.5)

## 2023-08-08 LAB — TSH: TSH: 2.29 u[IU]/mL (ref 0.35–5.50)

## 2023-08-15 ENCOUNTER — Encounter: Payer: Self-pay | Admitting: Internal Medicine

## 2023-08-15 ENCOUNTER — Ambulatory Visit: Payer: Medicare HMO | Admitting: Internal Medicine

## 2023-08-15 VITALS — BP 136/70 | HR 75 | Temp 98.1°F | Ht 61.0 in | Wt 160.2 lb

## 2023-08-15 DIAGNOSIS — I10 Essential (primary) hypertension: Secondary | ICD-10-CM | POA: Diagnosis not present

## 2023-08-15 DIAGNOSIS — Z1211 Encounter for screening for malignant neoplasm of colon: Secondary | ICD-10-CM

## 2023-08-15 DIAGNOSIS — I700 Atherosclerosis of aorta: Secondary | ICD-10-CM

## 2023-08-15 DIAGNOSIS — C44311 Basal cell carcinoma of skin of nose: Secondary | ICD-10-CM

## 2023-08-15 DIAGNOSIS — R7401 Elevation of levels of liver transaminase levels: Secondary | ICD-10-CM

## 2023-08-15 MED ORDER — LOSARTAN POTASSIUM 50 MG PO TABS: 50.0000 mg | ORAL_TABLET | Freq: Every day | ORAL | 1 refills | Status: DC

## 2023-08-15 MED ORDER — LOSARTAN POTASSIUM 50 MG PO TABS
50.0000 mg | ORAL_TABLET | Freq: Every day | ORAL | 1 refills | Status: DC
Start: 2023-08-15 — End: 2023-08-15

## 2023-08-15 MED ORDER — FAMOTIDINE 40 MG PO TABS: 40.0000 mg | ORAL_TABLET | Freq: Every day | ORAL | 1 refills | Status: DC

## 2023-08-15 NOTE — Progress Notes (Unsigned)
Subjective:  Patient ID: Michele Fernandez, female    DOB: 12/31/1945  Age: 77 y.o. MRN: [id number]  CC: The primary encounter diagnosis was Colon cancer screening. 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   HPI Michele Fernandez presents for  Chief Complaint  Patient presents with   Medical Management of Chronic Issues   1) S/P BASAL CELL CARCINOMA REMOVED from right side of nose by Dr .  Adriana Simas. STITCHES OUT ABOUT ONE WEEK AGO  .  NO COMPLICATION S  2) HYPERLIPIDEMIA:  patient had an LPA panel as part of a study she has decided to forego due to the length of stay (4 years) .   LPA WAS HIGH AT 235 .  She is taking lipitor 20 mg .  She has no history of CAD but has documented aortic  atherosclerosis   3) ELEVATED ALT :  noted on recent labs. .  No chronic use of NSAIDS , tylenol or alcohol.  Taking statin.  Repeating today    Outpatient Medications Prior to Visit  Medication Sig Dispense Refill   atorvastatin (LIPITOR) 20 MG tablet TAKE 1 TABLET BY MOUTH DAILY 90 tablet 3   Cholecalciferol (VITAMIN D3) 2000 units TABS Take 1 tablet by mouth daily.     cyclobenzaprine (FLEXERIL) 10 MG tablet Take 1 tablet (10 mg total) by mouth 3 (three) times daily as needed for muscle spasms. 60 tablet 0   hydrochlorothiazide (MICROZIDE) 12.5 MG capsule TAKE 1 CAPSULE EVERY DAY 90 capsule 1   Magnesium 250 MG TABS 1 tablet with a meal     Probiotic Product (ALIGN) 4 MG CAPS Take 1 capsule by mouth 3 (three) times a week.     famotidine (PEPCID) 40 MG tablet Take 1 tablet (40 mg total) by mouth daily. 90 tablet 1   losartan (COZAAR) 50 MG tablet Take 1 tablet (50 mg total) by mouth daily. 90 tablet 1   omeprazole (PRILOSEC) 20 MG capsule Take 1 capsule (20 mg total) by mouth daily. (Patient not taking: Reported on 08/15/2023) 90 capsule 1   No facility-administered  medications prior to visit.    Review of Systems;  Patient denies headache, fevers, malaise, unintentional weight loss, skin rash, eye pain, sinus congestion and sinus pain, sore throat, dysphagia,  hemoptysis , cough, dyspnea, wheezing, chest pain, palpitations, orthopnea, edema, abdominal pain, nausea, melena, diarrhea, constipation, flank pain, dysuria, hematuria, urinary  Frequency, nocturia, numbness, tingling, seizures,  Focal weakness, Loss of consciousness,  Tremor, insomnia, depression, anxiety, and suicidal ideation.      Objective:  BP 136/70   Pulse 75   Temp 98.1 F (36.7 C) (Oral)   Ht 5\' 1"  (1.549 m)   Wt 160 lb 3.2 oz (72.7 kg)   SpO2 95%   BMI 30.27 kg/m   BP Readings from Last 3 Encounters:  08/15/23 136/70  01/29/23 120/68  11/01/22 120/70    Wt Readings from Last 3 Encounters:  08/15/23 160 lb 3.2 oz (72.7 kg)  01/29/23 158 lb 3.2 oz (71.8 kg)  11/01/22 156 lb 3.2 oz (70.9 kg)    Physical Exam Vitals reviewed.  Constitutional:      General: She is not in acute distress.    Appearance: Normal appearance. She is normal weight. She is not ill-appearing, toxic-appearing or diaphoretic.  HENT:     Head: Normocephalic.  Eyes:  General: No scleral icterus.       Right eye: No discharge.        Left eye: No discharge.     Conjunctiva/sclera: Conjunctivae normal.  Cardiovascular:     Rate and Rhythm: Normal rate and regular rhythm.     Heart sounds: Normal heart sounds.  Pulmonary:     Effort: Pulmonary effort is normal. No respiratory distress.     Breath sounds: Normal breath sounds.  Musculoskeletal:        General: Normal range of motion.  Skin:    General: Skin is warm and dry.  Neurological:     General: No focal deficit present.     Mental Status: She is alert and oriented to person, place, and time. Mental status is at baseline.  Psychiatric:        Mood and Affect: Mood normal.        Behavior: Behavior normal.        Thought Content:  Thought content normal.        Judgment: Judgment normal.    Lab Results  Component Value Date   HGBA1C 5.6 08/08/2023   HGBA1C 5.6 01/24/2023   HGBA1C 5.6 01/23/2022    Lab Results  Component Value Date   CREATININE 0.83 08/08/2023   CREATININE 0.89 01/24/2023   CREATININE 0.85 07/24/2022    Lab Results  Component Value Date   WBC 6.1 08/08/2023   HGB 14.3 08/08/2023   HCT 44.1 08/08/2023   PLT 289.0 08/08/2023   GLUCOSE 83 08/08/2023   CHOL 154 08/08/2023   TRIG 93.0 08/08/2023   HDL 55.10 08/08/2023   LDLDIRECT 92.0 08/08/2023   LDLCALC 80 08/08/2023   ALT 25 08/15/2023   AST 21 08/15/2023   NA 142 08/08/2023   K 4.0 08/08/2023   CL 107 08/08/2023   CREATININE 0.83 08/08/2023   BUN 13 08/08/2023   CO2 27 08/08/2023   TSH 2.29 08/08/2023   HGBA1C 5.6 08/08/2023   MICROALBUR <0.7 08/08/2023    US Pelvic Complete With Transvaginal  Result Date: 09/15/2022 CLINICAL DATA:  New onset LEFT lower quadrant pain, history of multiple LEFT ovarian cysts, postmenopausal EXAM: TRANSABDOMINAL AND TRANSVAGINAL ULTRASOUND OF PELVIS TECHNIQUE: Both transabdominal and transvaginal ultrasound examinations of the pelvis were performed. Transabdominal technique was performed for global imaging of the pelvis including uterus, ovaries, adnexal regions, and pelvic cul-de-sac. It was necessary to proceed with endovaginal exam following the transabdominal exam to visualize the uterus, endometrium, and ovaries. COMPARISON:  01/13/2021 FINDINGS: Uterus Measurements: 5.8 x 5.0 x 3.5 cm = volume: 53 mL. Retroverted. Submucosal leiomyoma anterior uterus 2.7 x 2.9 x 2.5 cm. Additional submucosal leiomyoma mid uterus anteriorly 1.4 x 1.8 x 1.6 cm. Submucosal leiomyoma at fundus 1.2 x 1.5 x 1.3 cm. Endometrium Thickness: 2 mm. Moderate amount of endometrial fluid, nonspecific. No discrete mass. Right ovary Measurements: 1.3 x 0.8 x 1.4 cm = volume: 0.8 mL. Normal morphology without mass Left ovary  Measurements: 1.3 x 1.4 x 0.9 cm = volume: 0.9 mL. 8 mm cyst LEFT ovary; No followup imaging recommended. Note: This recommendation does not apply to premenarchal patients or to those with increased risk (genetic, family history, elevated tumor markers or other high-risk factors) of ovarian cancer. Reference: Radiology 2019 Nov; 293(2):359-371. Other findings No free pelvic fluid or adnexal masses. IMPRESSION: Multiple submucosal uterine leiomyomata. Moderate amount of nonspecific endometrial fluid. Remainder of exam unremarkable. Electronically Signed   By: Ulyses Southward M.D.   On: 09/15/2022  16:13    Assessment & Plan:  .Colon cancer screening -     Ambulatory referral to Gastroenterology  Primary hypertension Assessment & Plan: Home readings have been consistently 120/70 or lower.  No changes today.  Continue losartan current dose 50 mg and hydrochlorothiazide 12.5 mg   Lab Results  Component Value Date   CREATININE 0.83 08/08/2023   Lab Results  Component Value Date   NA 142 08/08/2023   K 4.0 08/08/2023   CL 107 08/08/2023   CO2 27 08/08/2023     Orders: -     Losartan Potassium; Take 1 tablet (50 mg total) by mouth daily.  Dispense: 90 tablet; Refill: 1  Elevated ALT measurement Assessment & Plan: Minimal, now resolved on repeat.   Lab Results  Component Value Date   ALT 25 08/15/2023   AST 21 08/15/2023   ALKPHOS 67 08/15/2023   BILITOT 0.6 08/15/2023   Lab Results  Component Value Date   CKTOTAL 94 08/15/2023     Orders: -     Hepatic function panel -     CK  Aortic atherosclerosis (HCC) Assessment & Plan: Reviewed findings of prior CT scan today..  Patient is already taking and tolerating high potency statin therapy and LDL has normalized.  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  Dispense: 90 tablet; Refill: 1     I provided 30 minutes of face-to-face time during this encounter reviewing patient's last visit with me, patient's  most recent visit with cardiology,  nephrology,  and neurology,  recent surgical and non surgical procedures, previous  labs and imaging studies, counseling on currently addressed issues,  and post visit ordering to diagnostics and therapeutics .   Follow-up: No follow-ups on file.   Sherlene Shams, MD

## 2023-08-15 NOTE — Patient Instructions (Signed)
I AGREE WITH TAKING CO Q 10 IF YOU ARE GOING TO STAY ON THE ATORVASTATIN   IF ALT IS  STILL ELEVATED, WE WILLSUSPEND ATORVASTATIN AND RECHECK IN 4 WEEKS

## 2023-08-16 ENCOUNTER — Encounter: Payer: Self-pay | Admitting: Internal Medicine

## 2023-08-16 DIAGNOSIS — R7401 Elevation of levels of liver transaminase levels: Secondary | ICD-10-CM | POA: Insufficient documentation

## 2023-08-16 DIAGNOSIS — C44311 Basal cell carcinoma of skin of nose: Secondary | ICD-10-CM | POA: Insufficient documentation

## 2023-08-16 LAB — HEPATIC FUNCTION PANEL
ALT: 25 U/L (ref 0–35)
AST: 21 U/L (ref 0–37)
Albumin: 4.2 g/dL (ref 3.5–5.2)
Alkaline Phosphatase: 67 U/L (ref 39–117)
Bilirubin, Direct: 0.1 mg/dL (ref 0.0–0.3)
Total Bilirubin: 0.6 mg/dL (ref 0.2–1.2)
Total Protein: 6.7 g/dL (ref 6.0–8.3)

## 2023-08-16 LAB — CK: Total CK: 94 U/L (ref 7–177)

## 2023-08-16 NOTE — Assessment & Plan Note (Signed)
Reviewed findings of prior CT scan today..  Patient is already taking and tolerating high potency statin therapy and LDL has normalized.  Given the elevated LPA and current LDL > 70  will recommend increasing dose to 40 mg daily  Lab Results  Component Value Date   CHOL 154 08/08/2023   HDL 55.10 08/08/2023   LDLCALC 80 08/08/2023   LDLDIRECT 92.0 08/08/2023   TRIG 93.0 08/08/2023   CHOLHDL 3 08/08/2023   Lab Results  Component Value Date   ALT 25 08/15/2023   AST 21 08/15/2023   ALKPHOS 67 08/15/2023   BILITOT 0.6 08/15/2023

## 2023-08-16 NOTE — Assessment & Plan Note (Signed)
Minimal, now resolved on repeat.   Lab Results  Component Value Date   ALT 25 08/15/2023   AST 21 08/15/2023   ALKPHOS 67 08/15/2023   BILITOT 0.6 08/15/2023   Lab Results  Component Value Date   CKTOTAL 94 08/15/2023

## 2023-08-16 NOTE — Assessment & Plan Note (Signed)
Home readings have been consistently 120/70 or lower.  No changes today.  Continue losartan current dose 50 mg and hydrochlorothiazide 12.5 mg   Lab Results  Component Value Date   CREATININE 0.83 08/08/2023   Lab Results  Component Value Date   NA 142 08/08/2023   K 4.0 08/08/2023   CL 107 08/08/2023   CO2 27 08/08/2023

## 2023-08-16 NOTE — Assessment & Plan Note (Signed)
S/p Moh's procedure Sept  2024 by Christiane Ha cook

## 2023-08-17 ENCOUNTER — Encounter: Payer: Self-pay | Admitting: Internal Medicine

## 2023-08-17 NOTE — Addendum Note (Signed)
Addended by: Sherlene Shams on: 08/17/2023 04:20 PM   Modules accepted: Orders

## 2023-08-19 MED ORDER — ATORVASTATIN CALCIUM 40 MG PO TABS: 40.0000 mg | ORAL_TABLET | Freq: Every day | ORAL | 1 refills | Status: DC

## 2023-08-22 ENCOUNTER — Encounter: Payer: Self-pay | Admitting: Internal Medicine

## 2023-08-24 NOTE — Telephone Encounter (Signed)
Tammy from [facility] called stating the provider sent a repeat colon referral but the pt does not want to have it because she is not having any problems

## 2023-08-27 NOTE — Telephone Encounter (Signed)
Dr. Darrick Huntsman is aware and stated that pt does not need the referral.

## 2023-09-17 ENCOUNTER — Encounter: Payer: Self-pay | Admitting: Internal Medicine

## 2023-10-03 ENCOUNTER — Encounter: Payer: Self-pay | Admitting: Internal Medicine

## 2023-10-08 ENCOUNTER — Encounter: Payer: Self-pay | Admitting: Internal Medicine

## 2023-10-19 LAB — HM MAMMOGRAPHY

## 2023-12-24 ENCOUNTER — Other Ambulatory Visit: Payer: Self-pay | Admitting: Internal Medicine

## 2024-01-17 ENCOUNTER — Encounter: Payer: Self-pay | Admitting: Internal Medicine

## 2024-01-17 DIAGNOSIS — I10 Essential (primary) hypertension: Secondary | ICD-10-CM

## 2024-01-17 DIAGNOSIS — E782 Mixed hyperlipidemia: Secondary | ICD-10-CM

## 2024-01-17 NOTE — Telephone Encounter (Signed)
I have pended labs for your approval.

## 2024-01-24 ENCOUNTER — Other Ambulatory Visit: Payer: Medicare HMO

## 2024-01-30 ENCOUNTER — Other Ambulatory Visit: Payer: Medicare HMO

## 2024-01-31 ENCOUNTER — Ambulatory Visit (INDEPENDENT_AMBULATORY_CARE_PROVIDER_SITE_OTHER): Payer: Medicare HMO | Admitting: Internal Medicine

## 2024-01-31 ENCOUNTER — Encounter: Payer: Self-pay | Admitting: Internal Medicine

## 2024-01-31 VITALS — BP 120/78 | HR 70 | Ht 61.0 in | Wt 160.2 lb

## 2024-01-31 DIAGNOSIS — K625 Hemorrhage of anus and rectum: Secondary | ICD-10-CM | POA: Insufficient documentation

## 2024-01-31 DIAGNOSIS — I10 Essential (primary) hypertension: Secondary | ICD-10-CM

## 2024-01-31 DIAGNOSIS — I700 Atherosclerosis of aorta: Secondary | ICD-10-CM

## 2024-01-31 DIAGNOSIS — Z0000 Encounter for general adult medical examination without abnormal findings: Secondary | ICD-10-CM

## 2024-01-31 DIAGNOSIS — E782 Mixed hyperlipidemia: Secondary | ICD-10-CM

## 2024-01-31 DIAGNOSIS — Z7184 Encounter for health counseling related to travel: Secondary | ICD-10-CM | POA: Insufficient documentation

## 2024-01-31 DIAGNOSIS — E663 Overweight: Secondary | ICD-10-CM

## 2024-01-31 LAB — COMPREHENSIVE METABOLIC PANEL
ALT: 24 U/L (ref 0–35)
AST: 20 U/L (ref 0–37)
Albumin: 4 g/dL (ref 3.5–5.2)
Alkaline Phosphatase: 68 U/L (ref 39–117)
BUN: 13 mg/dL (ref 6–23)
CO2: 29 meq/L (ref 19–32)
Calcium: 8.8 mg/dL (ref 8.4–10.5)
Chloride: 106 meq/L (ref 96–112)
Creatinine, Ser: 0.78 mg/dL (ref 0.40–1.20)
GFR: 74.34 mL/min (ref >60.00)
Glucose, Bld: 90 mg/dL (ref 70–99)
Potassium: 3.7 meq/L (ref 3.5–5.1)
Sodium: 141 meq/L (ref 135–145)
Total Bilirubin: 0.6 mg/dL (ref 0.2–1.2)
Total Protein: 6.5 g/dL (ref 6.0–8.3)

## 2024-01-31 LAB — LIPID PANEL
Cholesterol: 141 mg/dL (ref 0–200)
HDL: 60.7 mg/dL (ref >39.00)
LDL Cholesterol: 66 mg/dL (ref 0–99)
NonHDL: 80.36
Total CHOL/HDL Ratio: 2
Triglycerides: 70 mg/dL (ref 0.0–149.0)
VLDL: 14 mg/dL (ref 0.0–40.0)

## 2024-01-31 LAB — HEMOGLOBIN A1C: Hgb A1c MFr Bld: 5.7 % (ref 4.6–6.5)

## 2024-01-31 LAB — LDL CHOLESTEROL, DIRECT: Direct LDL: 69 mg/dL

## 2024-01-31 MED ORDER — PREDNISONE 10 MG PO TABS: ORAL_TABLET | ORAL | 0 refills | Status: DC

## 2024-01-31 MED ORDER — SCOPOLAMINE 1 MG/3DAYS TD PT72SCOPOLAMINE 1 MG/3DAYS
1.0000 | MEDICATED_PATCH | TRANSDERMAL | 0 refills | Status: DC
Start: 2024-01-31 — End: 2024-07-31

## 2024-01-31 MED ORDER — HYDROCHLOROTHIAZIDE 12.5 MG PO CAPS: 12.5000 mg | ORAL_CAPSULE | Freq: Every day | ORAL | 1 refills | Status: DC

## 2024-01-31 MED ORDER — ONDANSETRON 4 MG PO TBDP: 4.0000 mg | ORAL_TABLET | Freq: Three times a day (TID) | ORAL | 0 refills | Status: AC | PRN

## 2024-01-31 MED ORDER — LOSARTAN POTASSIUM 50 MG PO TABS: 50.0000 mg | ORAL_TABLET | Freq: Every day | ORAL | 1 refills | Status: DC

## 2024-01-31 MED ORDER — FAMOTIDINE 40 MG PO TABS: 40.0000 mg | ORAL_TABLET | Freq: Every day | ORAL | 1 refills | Status: AC

## 2024-01-31 MED ORDER — OSELTAMIVIR PHOSPHATE 75 MG PO CAPS: 75.0000 mg | ORAL_CAPSULE | Freq: Every day | ORAL | 0 refills | Status: DC

## 2024-01-31 MED ORDER — AZITHROMYCIN 500 MG PO TABS: 500.0000 mg | ORAL_TABLET | Freq: Every day | ORAL | 0 refills | Status: DC

## 2024-01-31 NOTE — Progress Notes (Signed)
 Patient ID: Michele Fernandez, female    DOB: 12-28-1945  Age: 78 y.o. MRN: [id number]  The patient is here for annual preventive  examination and management of other chronic and acute problems.   The risk factors are reflected in the social history.  The roster of all physicians providing medical care to patient - is listed in the Snapshot section of the chart.  Activities of daily living:  The patient is 100% independent in all ADLs: dressing, toileting, feeding as well as independent mobility  Home safety : The patient has smoke detectors in the home. They wear seatbelts.  There are no firearms at home. There is no violence in the home.   There is no risks for hepatitis, STDs or HIV. There is no   history of blood transfusion. They have no travel history to infectious disease endemic areas of the world.  The patient has seen their dentist in the last six month. They have seen their eye doctor in the last year. They admit to slight hearing difficulty with regard to whispered voices and some television programs.  They have deferred audiologic testing in the last year.  They do not  have excessive sun exposure. Discussed the need for sun protection: hats, long sleeves and use of sunscreen if there is significant sun exposure.   Diet: the importance of a healthy diet is discussed. They do have a healthy diet.  The benefits of regular aerobic exercise were discussed. She walks 5 times per week ,  30 minutes.   Depression screen: there are no signs or vegative symptoms of depression- irritability, change in appetite, anhedonia, sadness/tearfullness.  Cognitive assessment: the patient manages all their financial and personal affairs and is actively engaged. They could relate day,date,year and events; recalled 2/3 objects at 3 minutes; performed clock-face test normally.  The following portions of the patient's history were reviewed and updated as appropriate: allergies, current medications, past  family history, past medical history,  past surgical history, past social history  and problem list.  Visual acuity was not assessed per patient preference since she has regular follow up with her ophthalmologist. Hearing and body mass index were assessed and reviewed.   During the course of the visit the patient was educated and counseled about appropriate screening and preventive services including : fall prevention , diabetes screening, nutrition counseling, colorectal cancer screening, and recommended immunizations.    CC: The primary encounter diagnosis was Bright red rectal bleeding. Diagnoses of Primary hypertension, Mixed hyperlipidemia, Travel advice encounter, Encounter for preventive health examination, Overweight (BMI 25.0-29.9), and Aortic atherosclerosis (HCC) were also pertinent to this visit.  History Michele Fernandez has a past medical history of Allergy (?/ 2005), Cancer (HCC), GERD (gastroesophageal reflux disease), Hyperlipidemia (February 2017?), Hypertension, and Other sign and symptom in breast.   She has a past surgical history that includes Breast biopsy (Bilateral, 1993); Breast biopsy (Left, 1989); Upper gi endoscopy (2006); Hammer toe surgery (2006); Foot surgery (2009, 2012); Cerebral aneurysm repair (1991); Colonoscopy (2014); Eye surgery (Right, 11/2014); Cataract extraction w/PHACO (Left, 09/13/2015); and Cosmetic surgery (2007?).   Her family history includes Alcohol abuse in her brother, son, and son; Arthritis in her mother; COPD in her brother; Cancer in her brother, brother, maternal aunt, and maternal aunt; Depression in her son; Early death in her father; Hearing loss in her mother; Heart attack in her maternal grandmother; Heart disease in her brother; Hypertension in her brother; Stroke in her brother; Varicose Veins in her mother.She reports that  she quit smoking about 51 years ago. Her smoking use included cigarettes. She started smoking about 66 years ago. She has a  15 pack-year smoking history. She has never used smokeless tobacco. She reports current alcohol use. She reports that she does not use drugs.  Outpatient Medications Prior to Visit  Medication Sig Dispense Refill   atorvastatin (LIPITOR) 40 MG tablet TAKE 1 TABLET BY MOUTH DAILY. 90 tablet 1   Cholecalciferol (VITAMIN D3) 2000 units TABS Take 1 tablet by mouth daily.     Magnesium 250 MG TABS 1 tablet with a meal     Probiotic Product (ALIGN) 4 MG CAPS Take 1 capsule by mouth 3 (three) times a week.     famotidine (PEPCID) 40 MG tablet Take 1 tablet (40 mg total) by mouth daily. 90 tablet 1   hydrochlorothiazide (MICROZIDE) 12.5 MG capsule TAKE 1 CAPSULE EVERY DAY 90 capsule 1   losartan (COZAAR) 50 MG tablet Take 1 tablet (50 mg total) by mouth daily. 90 tablet 1   cyclobenzaprine (FLEXERIL) 10 MG tablet Take 1 tablet (10 mg total) by mouth 3 (three) times daily as needed for muscle spasms. (Patient not taking: Reported on 01/31/2024) 60 tablet 0   No facility-administered medications prior to visit.    Review of Systems  Patient denies headache, fevers, malaise, unintentional weight loss, skin rash, eye pain, sinus congestion and sinus pain, sore throat, dysphagia,  hemoptysis , cough, dyspnea, wheezing, chest pain, palpitations, orthopnea, edema, abdominal pain, nausea, melena, diarrhea, constipation, flank pain, dysuria, hematuria, urinary  Frequency, nocturia, numbness, tingling, seizures,  Focal weakness, Loss of consciousness,  Tremor, insomnia, depression, anxiety, and suicidal ideation.     Objective:  BP 120/78   Pulse 70   Ht 5\' 1"  (1.549 m)   Wt 160 lb 3.2 oz (72.7 kg)   SpO2 97%   BMI 30.27 kg/m   Physical Exam Vitals reviewed.  Constitutional:      General: She is not in acute distress.    Appearance: Normal appearance. She is well-developed and normal weight. She is not ill-appearing, toxic-appearing or diaphoretic.  HENT:     Head: Normocephalic.     Right Ear:  Tympanic membrane, ear canal and external ear normal. There is no impacted cerumen.     Left Ear: Tympanic membrane, ear canal and external ear normal. There is no impacted cerumen.     Nose: Nose normal.     Mouth/Throat:     Mouth: Mucous membranes are moist.     Pharynx: Oropharynx is clear.  Eyes:     General: No scleral icterus.       Right eye: No discharge.        Left eye: No discharge.     Conjunctiva/sclera: Conjunctivae normal.     Pupils: Pupils are equal, round, and reactive to light.  Neck:     Thyroid: No thyromegaly.     Vascular: No carotid bruit or JVD.  Cardiovascular:     Rate and Rhythm: Normal rate and regular rhythm.     Heart sounds: Normal heart sounds.  Pulmonary:     Effort: Pulmonary effort is normal. No respiratory distress.     Breath sounds: Normal breath sounds.  Chest:  Breasts:    Breasts are symmetrical.     Right: Normal. No swelling, inverted nipple, mass, nipple discharge, skin change or tenderness.     Left: Normal. No swelling, inverted nipple, mass, nipple discharge, skin change or tenderness.  Abdominal:  General: Bowel sounds are normal.     Palpations: Abdomen is soft. There is no mass.     Tenderness: There is no abdominal tenderness. There is no guarding or rebound.  Musculoskeletal:        General: Normal range of motion.     Cervical back: Normal range of motion and neck supple.  Lymphadenopathy:     Cervical: No cervical adenopathy.     Upper Body:     Right upper body: No supraclavicular, axillary or pectoral adenopathy.     Left upper body: No supraclavicular, axillary or pectoral adenopathy.  Skin:    General: Skin is warm and dry.  Neurological:     General: No focal deficit present.     Mental Status: She is alert and oriented to person, place, and time. Mental status is at baseline.  Psychiatric:        Mood and Affect: Mood normal.        Behavior: Behavior normal.        Thought Content: Thought content normal.         Judgment: Judgment normal.   Assessment & Plan:  Bright red rectal bleeding Assessment & Plan: Noted  a sall amoutn on tissue paper. Recently.  Has ext and int hemorrhoids by last colonoscopy in 2014. Marland Kitchen  Had normal cologaurd in 2024.  Sheis soncisdering having a colonoscoiy   Orders: -     Ambulatory referral to Gastroenterology  Primary hypertension Assessment & Plan: Well controlled based on home readings of 130/80 or less on hydrochlorothiazide and losartan . Renal function is pending but has been stable, no changes today.   Orders: -     hydroCHLOROthiazide; Take 1 capsule (12.5 mg total) by mouth daily.  Dispense: 90 capsule; Refill: 1 -     Losartan Potassium; Take 1 tablet (50 mg total) by mouth daily.  Dispense: 90 tablet; Refill: 1 -     Comprehensive metabolic panel  Mixed hyperlipidemia -     Hemoglobin A1c -     LDL cholesterol, direct -     Lipid panel  Travel advice encounter Assessment & Plan: Patient is travelling to [place] in a few days and going on a cruise. She was given prophylactic prescriptions for Tamiflu, prednisone, Zofran, scopalomine patches  and azithromycin to take with her along with a self test kit for COVID/FLU   Encounter for preventive health examination Assessment & Plan: age appropriate education and counseling updated, referrals for preventative services and immunizations addressed, dietary and smoking counseling addressed, most recent labs reviewed.  I have personally reviewed and have noted:   1) the patient's medical and social history 2) The pt's use of alcohol, tobacco, and illicit drugs 3) The patient's current medications and supplements 4) Functional ability including ADL's, fall risk, home safety risk, hearing and visual impairment 5) Diet and physical activities 6) Evidence for depression or mood disorder 7) The patient's height, weight, and BMI have been recorded in the chart   I have made referrals, and provided  counseling and education based on review of the above    Overweight (BMI 25.0-29.9) Assessment & Plan: I have addressed  BMI and recommended a low glycemic index diet utilizing smaller more frequent meals to increase metabolism.  I have also recommended that patient start exercising with a goal of 30 minutes of aerobic exercise a minimum of 5 days per week. Screening for lipid disorders, thyroid and diabetes has been done    Aortic atherosclerosis (  HCC) Assessment & Plan: Reviewed findings of prior CT scan today..  Patient is already taking and tolerating high potency statin therapy and LDL has normalized dose was increased d to 40 mg after last LDL for goal LDL < 70  and is pending   Lab Results  Component Value Date   ALT 25 08/15/2023   AST 21 08/15/2023   ALKPHOS 67 08/15/2023   BILITOT 0.6 08/15/2023      Other orders -     Famotidine; Take 1 tablet (40 mg total) by mouth daily.  Dispense: 90 tablet; Refill: 1 -     Oseltamivir Phosphate; Take 1 capsule (75 mg total) by mouth daily.  Dispense: 10 capsule; Refill: 0 -     predniSONE; 6 tablets on Day 1 , then reduce by 1 tablet daily until gone  Dispense: 21 tablet; Refill: 0 -     Azithromycin; Take 1 tablet (500 mg total) by mouth daily.  Dispense: 7 tablet; Refill: 0 -     Ondansetron; Take 1 tablet (4 mg total) by mouth every 8 (eight) hours as needed for nausea or vomiting.  Dispense: 20 tablet; Refill: 0 -     Scopolamine; Place 1 patch (1.5 mg total) onto the skin every 3 (three) days.  Dispense: 4 patch; Refill: 0    Follow-up: No follow-ups on file.   Sherlene Shams, MD

## 2024-01-31 NOTE — Assessment & Plan Note (Signed)
 Well controlled based on home readings of 130/80 or less on hydrochlorothiazide and losartan . Renal function is pending but has been stable, no changes today.

## 2024-01-31 NOTE — Assessment & Plan Note (Addendum)
 Noted  a sall amoutn on tissue paper. Recently.  Has ext and int hemorrhoids by last colonoscopy in 2014. Michele Fernandez  Had normal cologaurd in 2024.  Sheis soncisdering having a colonoscoiy

## 2024-01-31 NOTE — Assessment & Plan Note (Signed)
 Patient is travelling to [place] in a few days and going on a cruise. She was given prophylactic prescriptions for Tamiflu, prednisone, Zofran, scopalomine patches  and azithromycin to take with her along with a self test kit for COVID/FLU

## 2024-01-31 NOTE — Assessment & Plan Note (Signed)

## 2024-01-31 NOTE — Assessment & Plan Note (Signed)
 I have addressed  BMI and recommended a low glycemic index diet utilizing smaller more frequent meals to increase metabolism.  I have also recommended that patient start exercising with a goal of 30 minutes of aerobic exercise a minimum of 5 days per week. Screening for lipid disorders, thyroid and diabetes has  been done

## 2024-01-31 NOTE — Assessment & Plan Note (Signed)
 Reviewed findings of prior CT scan today..  Patient is already taking and tolerating high potency statin therapy and LDL has normalized dose was increased d to 40 mg after last LDL for goal LDL < 70  and is pending   Lab Results  Component Value Date   ALT 25 08/15/2023   AST 21 08/15/2023   ALKPHOS 67 08/15/2023   BILITOT 0.6 08/15/2023

## 2024-01-31 NOTE — Patient Instructions (Signed)
  I have sent Tamiflu,  Prednisone taper,  azithromycin.  Scopalamine patches  and zofran  to your pharmacy to take on your trip    Start the tamiflu once daily if you have been exposed to the flu but have no symptoms.  Convert to twice daily if you develop flu symptoms after a known exposure and call or a refill   Do NOT  Start the antibiotic  For a viral URI, which is what most infections passed on a plane are from.  Start the prednisone and take an oral decongestant (sudafed PE)   And benadryl/Delsym for nighttime cough.  Only start the azithromycin if you  are having fevers (100.4 or higher),   Sinus pain and purulent drainage,  Or ear pain.  Please take a probiotic ( Align, Floraque or Culturelle), the generic version of one of these over the counter medications, or an alternative form (kombucha,  Yogurt, or another dietary source) for a minimum of 3 weeks to prevent a serious antibiotic associated diarrhea  Called clostridium dificile colitis.  Taking a probiotic may also prevent vaginitis due to yeast infections and can be continued indefinitely if you feel that it improves your digestion or your elimination (bowels)

## 2024-02-02 ENCOUNTER — Encounter: Payer: Self-pay | Admitting: Internal Medicine

## 2024-02-05 ENCOUNTER — Other Ambulatory Visit: Payer: Medicare HMO

## 2024-02-26 ENCOUNTER — Other Ambulatory Visit: Payer: Self-pay | Admitting: Internal Medicine

## 2024-06-16 ENCOUNTER — Encounter: Payer: Self-pay | Admitting: Internal Medicine

## 2024-06-16 DIAGNOSIS — I10 Essential (primary) hypertension: Secondary | ICD-10-CM

## 2024-06-16 MED ORDER — LOSARTAN POTASSIUM 50 MG PO TABS: 50.0000 mg | ORAL_TABLET | Freq: Every day | ORAL | 5 refills | Status: AC

## 2024-06-30 ENCOUNTER — Encounter: Payer: Self-pay | Admitting: Internal Medicine

## 2024-06-30 DIAGNOSIS — R5383 Other fatigue: Secondary | ICD-10-CM

## 2024-06-30 DIAGNOSIS — E538 Deficiency of other specified B group vitamins: Secondary | ICD-10-CM

## 2024-06-30 DIAGNOSIS — I10 Essential (primary) hypertension: Secondary | ICD-10-CM

## 2024-06-30 DIAGNOSIS — E782 Mixed hyperlipidemia: Secondary | ICD-10-CM

## 2024-06-30 DIAGNOSIS — E559 Vitamin D deficiency, unspecified: Secondary | ICD-10-CM

## 2024-06-30 MED ORDER — HYDROCHLOROTHIAZIDE 12.5 MG PO CAPS: 12.5000 mg | ORAL_CAPSULE | Freq: Every day | ORAL | 5 refills | Status: AC

## 2024-06-30 NOTE — Telephone Encounter (Signed)
 Hydrochlorothiazide  already sent to pharmacy also future labs has been pending for approval

## 2024-06-30 NOTE — Addendum Note (Signed)
 Addended by: Ryott Rafferty on: 06/30/2024 03:06 PM   Modules accepted: Orders

## 2024-07-12 ENCOUNTER — Other Ambulatory Visit: Payer: Self-pay | Admitting: Internal Medicine

## 2024-07-22 ENCOUNTER — Other Ambulatory Visit (INDEPENDENT_AMBULATORY_CARE_PROVIDER_SITE_OTHER)

## 2024-07-22 DIAGNOSIS — E538 Deficiency of other specified B group vitamins: Secondary | ICD-10-CM | POA: Diagnosis not present

## 2024-07-22 DIAGNOSIS — E782 Mixed hyperlipidemia: Secondary | ICD-10-CM

## 2024-07-22 DIAGNOSIS — E559 Vitamin D deficiency, unspecified: Secondary | ICD-10-CM | POA: Diagnosis not present

## 2024-07-22 DIAGNOSIS — I10 Essential (primary) hypertension: Secondary | ICD-10-CM | POA: Diagnosis not present

## 2024-07-22 DIAGNOSIS — R5383 Other fatigue: Secondary | ICD-10-CM | POA: Diagnosis not present

## 2024-07-22 LAB — CBC WITH DIFFERENTIAL/PLATELET
Basophils Absolute: 0 K/uL (ref 0.0–0.1)
Basophils Relative: 0.5 % (ref 0.0–3.0)
Eosinophils Absolute: 0.1 K/uL (ref 0.0–0.7)
Eosinophils Relative: 1.8 % (ref 0.0–5.0)
HCT: 43.8 % (ref 36.0–46.0)
Hemoglobin: 14.4 g/dL (ref 12.0–15.0)
Lymphocytes Relative: 32.1 % (ref 12.0–46.0)
Lymphs Abs: 1.9 K/uL (ref 0.7–4.0)
MCHC: 32.9 g/dL (ref 30.0–36.0)
MCV: 86.9 fl (ref 78.0–100.0)
Monocytes Absolute: 0.5 K/uL (ref 0.1–1.0)
Monocytes Relative: 7.9 % (ref 3.0–12.0)
Neutro Abs: 3.4 K/uL (ref 1.4–7.7)
Neutrophils Relative %: 57.7 % (ref 43.0–77.0)
Platelets: 276 K/uL (ref 150.0–400.0)
RBC: 5.04 Mil/uL (ref 3.87–5.11)
RDW: 14 % (ref 11.5–15.5)
WBC: 5.9 K/uL (ref 4.0–10.5)

## 2024-07-22 LAB — LIPID PANEL
Cholesterol: 153 mg/dL (ref 0–200)
HDL: 70.2 mg/dL (ref >39.00)
LDL Cholesterol: 72 mg/dL (ref 0–99)
NonHDL: 83
Total CHOL/HDL Ratio: 2
Triglycerides: 56 mg/dL (ref 0.0–149.0)
VLDL: 11.2 mg/dL (ref 0.0–40.0)

## 2024-07-22 LAB — COMPREHENSIVE METABOLIC PANEL WITH GFR
ALT: 103 U/L — ABNORMAL HIGH (ref 0–35)
AST: 56 U/L — ABNORMAL HIGH (ref 0–37)
Albumin: 4.1 g/dL (ref 3.5–5.2)
Alkaline Phosphatase: 244 U/L — ABNORMAL HIGH (ref 39–117)
BUN: 11 mg/dL (ref 6–23)
CO2: 27 meq/L (ref 19–32)
Calcium: 8.9 mg/dL (ref 8.4–10.5)
Chloride: 106 meq/L (ref 96–112)
Creatinine, Ser: 0.78 mg/dL (ref 0.40–1.20)
GFR: 74.09 mL/min (ref >60.00)
Glucose, Bld: 88 mg/dL (ref 70–99)
Potassium: 4 meq/L (ref 3.5–5.1)
Sodium: 140 meq/L (ref 135–145)
Total Bilirubin: 0.8 mg/dL (ref 0.2–1.2)
Total Protein: 6.1 g/dL (ref 6.0–8.3)

## 2024-07-22 LAB — B12 AND FOLATE PANEL
Folate: 12.6 ng/mL (ref >5.9)
Vitamin B-12: 354 pg/mL (ref 211–911)

## 2024-07-22 LAB — HEMOGLOBIN A1C: Hgb A1c MFr Bld: 5.9 % (ref 4.6–6.5)

## 2024-07-22 LAB — VITAMIN D 25 HYDROXY (VIT D DEFICIENCY, FRACTURES): VITD: 47.84 ng/mL (ref 30.00–100.00)

## 2024-07-24 ENCOUNTER — Ambulatory Visit: Payer: Self-pay | Admitting: Internal Medicine

## 2024-07-24 DIAGNOSIS — R1011 Right upper quadrant pain: Secondary | ICD-10-CM

## 2024-07-25 ENCOUNTER — Telehealth: Payer: Self-pay | Admitting: Internal Medicine

## 2024-07-25 ENCOUNTER — Other Ambulatory Visit: Payer: Self-pay | Admitting: Internal Medicine

## 2024-07-25 DIAGNOSIS — R945 Abnormal results of liver function studies: Secondary | ICD-10-CM

## 2024-07-25 NOTE — Telephone Encounter (Signed)
 Lab order needed

## 2024-07-25 NOTE — Telephone Encounter (Signed)
 Spoke to pt informed her of Dr Marylynn message pt verbalized understanding scheduled lab appt also

## 2024-07-29 ENCOUNTER — Ambulatory Visit
Admission: RE | Admit: 2024-07-29 | Discharge: 2024-07-29 | Disposition: A | Discharge status: Home / Self Care | Source: Ambulatory Visit | Attending: Internal Medicine | Admitting: Internal Medicine

## 2024-07-29 ENCOUNTER — Other Ambulatory Visit (INDEPENDENT_AMBULATORY_CARE_PROVIDER_SITE_OTHER)

## 2024-07-29 DIAGNOSIS — R1011 Right upper quadrant pain: Secondary | ICD-10-CM | POA: Insufficient documentation

## 2024-07-29 DIAGNOSIS — R945 Abnormal results of liver function studies: Secondary | ICD-10-CM | POA: Diagnosis not present

## 2024-07-29 LAB — HEPATIC FUNCTION PANEL
ALT: 76 U/L — ABNORMAL HIGH (ref 0–35)
AST: 48 U/L — ABNORMAL HIGH (ref 0–37)
Albumin: 4.1 g/dL (ref 3.5–5.2)
Alkaline Phosphatase: 211 U/L — ABNORMAL HIGH (ref 39–117)
Bilirubin, Direct: 0.2 mg/dL (ref 0.0–0.3)
Total Bilirubin: 0.8 mg/dL (ref 0.2–1.2)
Total Protein: 6.4 g/dL (ref 6.0–8.3)

## 2024-07-30 ENCOUNTER — Ambulatory Visit: Payer: Medicare HMO | Admitting: Internal Medicine

## 2024-07-31 ENCOUNTER — Ambulatory Visit: Payer: Self-pay | Admitting: Internal Medicine

## 2024-07-31 ENCOUNTER — Ambulatory Visit: Admitting: Internal Medicine

## 2024-07-31 ENCOUNTER — Encounter: Payer: Self-pay | Admitting: Internal Medicine

## 2024-07-31 VITALS — BP 162/90 | HR 80 | Resp 12 | Wt 159.0 lb

## 2024-07-31 DIAGNOSIS — I10 Essential (primary) hypertension: Secondary | ICD-10-CM | POA: Diagnosis not present

## 2024-07-31 DIAGNOSIS — R945 Abnormal results of liver function studies: Secondary | ICD-10-CM

## 2024-07-31 DIAGNOSIS — Z1231 Encounter for screening mammogram for malignant neoplasm of breast: Secondary | ICD-10-CM | POA: Diagnosis not present

## 2024-07-31 NOTE — Patient Instructions (Addendum)
 YOUR MAMMOGRAM IS DUE in November, PLEASE CALL AND GET THIS SCHEDULED! [facility]: [phone].   Your blood pressure is still higher than expected. .  Please review your home readings and let me know if the majority are < 130/80.  If not,  we will increase the hydrochlorothiazide  to 25 mg daily   When you return for labs in a week we will check your urine for protein

## 2024-07-31 NOTE — Progress Notes (Signed)
 Subjective:  Patient ID: Michele Fernandez, female    DOB: 07-Oct-1946  Age: 78 y.o. MRN: [id number]  CC: The primary encounter diagnosis was Encounter for screening mammogram for malignant neoplasm of breast. Diagnoses of Liver function abnormality, Primary hypertension, and Abnormal liver function were also pertinent to this visit.   HPI Michele Fernandez presents for  Chief Complaint  Patient presents with   Medical Management of Chronic Issues    6 month follow up    1) ELEVATED LIVER ENZYMES NOTED 2 WEEKS AGO.    History of recurrent episodes of mild Right sided back pain not temporally related to eating and not accompanied by RUQ  pain.   She has been having increased esophagitis and nausea ,  pepcid  no longer working   nd intermittent mild nausea  liver ultrasound was done recently,  results still pending  the exam was not particularly painful.     2)  hyperlipidemia:  tolerating atorvastatin :  liver enzymes were elevated but on repeat are coming down.    3) HTN;  taking losartan  /hct .  She has been experiencing increased stress at home due to husband's cognitive decline .  home readings  have been checked  and  range from < 130/80 to 150/84 ,  this morning  was 136/76   Outpatient Medications Prior to Visit  Medication Sig Dispense Refill   atorvastatin  (LIPITOR) 40 MG tablet TAKE 1 TABLET BY MOUTH DAILY. 90 tablet 1   Cholecalciferol (VITAMIN D3) 2000 units TABS Take 1 tablet by mouth daily.     famotidine  (PEPCID ) 40 MG tablet Take 1 tablet (40 mg total) by mouth daily. 90 tablet 1   hydrochlorothiazide  (MICROZIDE ) 12.5 MG capsule Take 1 capsule (12.5 mg total) by mouth daily. 30 capsule 5   losartan  (COZAAR ) 50 MG tablet Take 1 tablet (50 mg total) by mouth daily. 30 tablet 5   Magnesium  250 MG TABS 1 tablet with a meal     ondansetron  (ZOFRAN -ODT) 4 MG disintegrating tablet Take 1 tablet (4 mg total) by mouth every 8 (eight) hours as needed for nausea or vomiting. 20  tablet 0   Probiotic Product (ALIGN) 4 MG CAPS Take 1 capsule by mouth 3 (three) times a week.     azithromycin  (ZITHROMAX ) 500 MG tablet Take 1 tablet (500 mg total) by mouth daily. 7 tablet 0   cyclobenzaprine  (FLEXERIL ) 10 MG tablet Take 1 tablet (10 mg total) by mouth 3 (three) times daily as needed for muscle spasms. (Patient not taking: Reported on 07/31/2024) 60 tablet 0   omeprazole  (PRILOSEC) 20 MG capsule TAKE 1 CAPSULE EVERY DAY 90 capsule 1   oseltamivir  (TAMIFLU ) 75 MG capsule Take 1 capsule (75 mg total) by mouth daily. 10 capsule 0   predniSONE  (DELTASONE ) 10 MG tablet 6 tablets on Day 1 , then reduce by 1 tablet daily until gone 21 tablet 0   scopolamine  (TRANSDERM-SCOP) 1 MG/3DAYS Place 1 patch (1.5 mg total) onto the skin every 3 (three) days. (Patient not taking: Reported on 07/31/2024) 4 patch 0   No facility-administered medications prior to visit.    Review of Systems;  Patient denies headache, fevers, malaise, unintentional weight loss, skin rash, eye pain, sinus congestion and sinus pain, sore throat, dysphagia,  hemoptysis , cough, dyspnea, wheezing, chest pain, palpitations, orthopnea, edema, abdominal pain, nausea, melena, diarrhea, constipation, flank pain, dysuria, hematuria, urinary  Frequency, nocturia, numbness, tingling, seizures,  Focal weakness, Loss of consciousness,  Tremor, insomnia,  depression, anxiety, and suicidal ideation.      Objective:  BP (!) 162/90 (Cuff Size: Normal)   Pulse 80   Resp 12   Wt 159 lb (72.1 kg)   SpO2 98%   BMI 30.04 kg/m   BP Readings from Last 3 Encounters:  07/31/24 (!) 162/90  01/31/24 120/78  08/15/23 136/70    Wt Readings from Last 3 Encounters:  07/31/24 159 lb (72.1 kg)  01/31/24 160 lb 3.2 oz (72.7 kg)  08/15/23 160 lb 3.2 oz (72.7 kg)    Physical Exam Vitals reviewed.  Constitutional:      General: She is not in acute distress.    Appearance: Normal appearance. She is normal weight. She is not  ill-appearing, toxic-appearing or diaphoretic.  HENT:     Head: Normocephalic.  Eyes:     General: No scleral icterus.       Right eye: No discharge.        Left eye: No discharge.     Conjunctiva/sclera: Conjunctivae normal.  Cardiovascular:     Rate and Rhythm: Normal rate and regular rhythm.     Heart sounds: Normal heart sounds.  Pulmonary:     Effort: Pulmonary effort is normal. No respiratory distress.     Breath sounds: Normal breath sounds.  Abdominal:     General: Abdomen is flat. Bowel sounds are normal.     Tenderness: There is no right CVA tenderness, left CVA tenderness, guarding or rebound.  Musculoskeletal:        General: Normal range of motion.  Skin:    General: Skin is warm and dry.  Neurological:     General: No focal deficit present.     Mental Status: She is alert and oriented to person, place, and time. Mental status is at baseline.  Psychiatric:        Mood and Affect: Mood normal.        Behavior: Behavior normal.        Thought Content: Thought content normal.        Judgment: Judgment normal.     Lab Results  Component Value Date   HGBA1C 5.9 07/22/2024   HGBA1C 5.7 01/31/2024   HGBA1C 5.6 08/08/2023    Lab Results  Component Value Date   CREATININE 0.78 07/22/2024   CREATININE 0.78 01/31/2024   CREATININE 0.83 08/08/2023    Lab Results  Component Value Date   WBC 5.9 07/22/2024   HGB 14.4 07/22/2024   HCT 43.8 07/22/2024   PLT 276.0 07/22/2024   GLUCOSE 88 07/22/2024   CHOL 153 07/22/2024   TRIG 56.0 07/22/2024   HDL 70.20 07/22/2024   LDLDIRECT 69.0 01/31/2024   LDLCALC 72 07/22/2024   ALT 76 (H) 07/29/2024   AST 48 (H) 07/29/2024   NA 140 07/22/2024   K 4.0 07/22/2024   CL 106 07/22/2024   CREATININE 0.78 07/22/2024   BUN 11 07/22/2024   CO2 27 07/22/2024   TSH 2.29 08/08/2023   HGBA1C 5.9 07/22/2024    No results found.  Assessment & Plan:  .Encounter for screening mammogram for malignant neoplasm of breast -      3D Screening Mammogram, Left and Right; Future  Liver function abnormality -     Hepatic function panel; Future -     Lipase; Future  Primary hypertension Assessment & Plan: she reports compliance with medication regimen  but has an elevated reading today in office.  She is not using NSAIDs daily.  Discussed goal  of 120/70  (130/80 for patients over 70)  to preserve renal function.  She has been asked to check her  BP  at home and  submit readings for evaluation. Renal function, electrolytes and screen for proteinuria are all normal   Orders: -     Microalbumin / creatinine urine ratio; Future  Abnormal liver function Assessment & Plan: She has had intermittent right CVA pain not  temporally related to eating.  Repeat LFTs including alk phos have improved  and RUQ ultrasound was negative for gallstones,  dilated CBD.  Repeat lFTs in one week,  consider HIDA if pain  is recurrent .  Lab Results  Component Value Date   ALT 76 (H) 07/29/2024   AST 48 (H) 07/29/2024   ALKPHOS 211 (H) 07/29/2024   BILITOT 0.8 07/29/2024        Follow-up: Return in about 6 months (around 01/31/2025).   Verneita LITTIE Kettering, MD

## 2024-08-02 ENCOUNTER — Ambulatory Visit: Payer: Self-pay | Admitting: Internal Medicine

## 2024-08-02 ENCOUNTER — Encounter: Payer: Self-pay | Admitting: Internal Medicine

## 2024-08-02 DIAGNOSIS — R945 Abnormal results of liver function studies: Secondary | ICD-10-CM | POA: Insufficient documentation

## 2024-08-02 NOTE — Assessment & Plan Note (Signed)
 She has had intermittent right CVA pain not  temporally related to eating.  Repeat LFTs including alk phos have improved  and RUQ ultrasound was negative for gallstones,  dilated CBD.  Repeat lFTs in one week,  consider HIDA if pain  is recurrent .  Lab Results  Component Value Date   ALT 76 (H) 07/29/2024   AST 48 (H) 07/29/2024   ALKPHOS 211 (H) 07/29/2024   BILITOT 0.8 07/29/2024

## 2024-08-02 NOTE — Assessment & Plan Note (Signed)
she reports compliance with medication regimen  but has an elevated reading today in office.  She is not using NSAIDs daily.  Discussed goal of 120/70  (130/80 for patients over 70)  to preserve renal function.  She has been asked to check her  BP  at home and  submit readings for evaluation. Renal function, electrolytes and screen for proteinuria are all normal . 

## 2024-08-05 ENCOUNTER — Telehealth: Admitting: Physician Assistant

## 2024-08-05 ENCOUNTER — Other Ambulatory Visit

## 2024-08-05 ENCOUNTER — Encounter: Payer: Self-pay | Admitting: Internal Medicine

## 2024-08-05 DIAGNOSIS — Z20822 Contact with and (suspected) exposure to covid-19: Secondary | ICD-10-CM | POA: Diagnosis not present

## 2024-08-05 MED ORDER — PHENOL 1.4 % MT LIQD: 1.0000 | OROMUCOSAL | 0 refills | Status: AC | PRN

## 2024-08-05 NOTE — Progress Notes (Signed)
 Ms. Michele Fernandez, Michele Fernandez are scheduled for a virtual visit with your provider today.    Just as we do with appointments in the office, we must obtain your consent to participate.  Your consent will be active for this visit and any virtual visit you may have with one of our providers in the next 365 days.    If you have a MyChart account, I can also send a copy of this consent to you electronically.  All virtual visits are billed to your insurance company just like a traditional visit in the office.  As this is a virtual visit, video technology does not allow for your provider to perform a traditional examination.  This may limit your provider's ability to fully assess your condition.  If your provider identifies any concerns that need to be evaluated in person or the need to arrange testing such as labs, EKG, etc, we will make arrangements to do so.    Although advances in technology are sophisticated, we cannot ensure that it will always work on either your end or our end.  If the connection with a video visit is poor, we may have to switch to a telephone visit.  With either a video or telephone visit, we are not always able to ensure that we have a secure connection.   I need to obtain your verbal consent now.   Are you willing to proceed with your visit today?   Michele Fernandez has provided verbal consent on 08/05/2024 for a virtual visit (video or telephone).   Michele GORMAN Snuffer, PA-C 08/05/2024  12:00 PM   Date:  08/05/2024   ID:  Michele Fernandez, DOB Mar 29, 1946, MRN [id number]  Patient Location: Home Provider Location: Home Office   Participants: Patient and Provider for Visit and Wrap up  Method of visit: Video  Location of Patient: Home Location of Provider: Home Office Consent was obtain for visit over the video. Services rendered by provider: Visit was performed via video  A video enabled telemedicine application was used and I verified that I am speaking with the correct person using  two identifiers.  PCP:  Michele Verneita CROME, MD   Chief Complaint:  covid  History of Present Illness:    Michele Fernandez is a 78 y.o. female with history as stated below. Presents video telehealth for an acute care visit  Pt states she tested positive for covid 5 days ago. She reports that she initially had fevers which have resolved. She has had cough, congestion, rhinorrhea, and sore throat. States the cough and rhinorrhea are improving however the sore throat has persisted.  Past Medical, Surgical, Social History, Allergies, and Medications have been Reviewed.  Past Medical History:  Diagnosis Date   Allergy ?/ 2005   Biaxin; Demerol (IV) Covid vaccine   Cancer (HCC)    skin   GERD (gastroesophageal reflux disease)    Hyperlipidemia February 2017?   Hypertension    Other sign and symptom in breast     No outpatient medications have been marked as taking for the 08/05/24 encounter (Appointment) with [facility] PROVIDER.     Allergies:   Biaxin [clarithromycin], Covid-19 (mrna) vaccine, Demerol [meperidine], and Vancomycin   ROS See HPI for history of present illness.  Physical Exam Constitutional:      Appearance: Normal appearance.  HENT:     Mouth/Throat:     Comments: Exam severely limited secondary to nature of the visit however no obvious evidence of pta Pulmonary:  Effort: Pulmonary effort is normal.  Neurological:     Mental Status: She is alert.               MDM: Pt with recent diagnosis of covid. She is 5 days out from symptoms therefore antivirals indicated. Likely viral pharyngitis. Rx for chloraseptic sent and supportive measures discussed    Tests Ordered: No orders of the defined types were placed in this encounter.   Medication Changes: No orders of the defined types were placed in this encounter.    Disposition:  Follow up  Signed, Aariv Medlock GORMAN Snuffer, PA-C  08/05/2024 12:00 PM

## 2024-08-05 NOTE — Patient Instructions (Addendum)
 Michele Fernandez, thank you for joining Lynden GORMAN Snuffer, PA-C for today's virtual visit.  While this provider is not your primary care provider (PCP), if your PCP is located in our provider database this encounter information will be shared with them immediately following your visit.   A [place] MyChart account gives you access to today's visit and all your visits, tests, and labs performed at [facility]  click here if you don't have a [facility] MyChart account or go to [email or web address]  Consent: (Patient) Michele Fernandez provided verbal consent for this virtual visit at the beginning of the encounter.  Current Medications:  Current Outpatient Medications:    atorvastatin  (LIPITOR) 40 MG tablet, TAKE 1 TABLET BY MOUTH DAILY., Disp: 90 tablet, Rfl: 1   Cholecalciferol (VITAMIN D3) 2000 units TABS, Take 1 tablet by mouth daily., Disp: , Rfl:    famotidine  (PEPCID ) 40 MG tablet, Take 1 tablet (40 mg total) by mouth daily., Disp: 90 tablet, Rfl: 1   hydrochlorothiazide  (MICROZIDE ) 12.5 MG capsule, Take 1 capsule (12.5 mg total) by mouth daily., Disp: 30 capsule, Rfl: 5   losartan  (COZAAR ) 50 MG tablet, Take 1 tablet (50 mg total) by mouth daily., Disp: 30 tablet, Rfl: 5   Magnesium  250 MG TABS, 1 tablet with a meal, Disp: , Rfl:    ondansetron  (ZOFRAN -ODT) 4 MG disintegrating tablet, Take 1 tablet (4 mg total) by mouth every 8 (eight) hours as needed for nausea or vomiting., Disp: 20 tablet, Rfl: 0   Probiotic Product (ALIGN) 4 MG CAPS, Take 1 capsule by mouth 3 (three) times a week., Disp: , Rfl:    Medications ordered in this encounter:  No orders of the defined types were placed in this encounter.    *If you need refills on other medications prior to your next appointment, please contact your pharmacy*  Follow-Up: Call back or seek an in-person evaluation if the symptoms worsen or if the condition fails to improve as anticipated.  [facility] [phone]  Other Instructions Please continue to rotate tylenol  and motrin for discomfort.   If you were given a prescription, please take the prescription as you were instructed and follow the directions given on the discharge paperwork.   Over the next several days you should rest as much as possible, and drink more fluids than usual. Liquids will help thin and loosen mucus so you can cough it up. Liquids will also help prevent dehydration. Using a cool mist humidifier or a vaporizer to increase air moisture in your home can also make it easier for you to breathe and help decrease your cough.  To help soothe a sore throat gargle with warm salt water.  Make salt water by dissolving  teaspoon salt in 1 cup warm water. You may also use throat lozenges and over the counter sore throat spray.   Follow up with your regular doctor in 1 week for reassessment and seek care sooner if your symptoms worsen or fail to improve.   If you have been instructed to have an in-person evaluation today at a local Urgent Care facility, please use the link below. It will take you to a list of all of our available [facility], including address, phone number and hours of operation. Please do not delay care.  [facility]  If you or a family member do not have a primary care provider, use the link below to schedule a visit and establish  care. When you choose a [facility] primary care physician or advanced practice provider, you gain a long-term partner in health. Find a Primary Care Provider  Learn more about [facility]'s in-office and virtual care options: [facility] - Get Care Now

## 2024-08-05 NOTE — Telephone Encounter (Signed)
 See other mychart message. Pcp was in contact with pt

## 2024-08-08 ENCOUNTER — Encounter: Payer: Self-pay | Admitting: Internal Medicine

## 2024-08-11 ENCOUNTER — Encounter: Payer: Self-pay | Admitting: Internal Medicine

## 2024-08-11 ENCOUNTER — Other Ambulatory Visit (INDEPENDENT_AMBULATORY_CARE_PROVIDER_SITE_OTHER)

## 2024-08-11 DIAGNOSIS — R945 Abnormal results of liver function studies: Secondary | ICD-10-CM

## 2024-08-11 DIAGNOSIS — I10 Essential (primary) hypertension: Secondary | ICD-10-CM

## 2024-08-11 LAB — HEPATIC FUNCTION PANEL
ALT: 96 U/L — ABNORMAL HIGH (ref 0–35)
AST: 44 U/L — ABNORMAL HIGH (ref 0–37)
Albumin: 3.9 g/dL (ref 3.5–5.2)
Alkaline Phosphatase: 205 U/L — ABNORMAL HIGH (ref 39–117)
Bilirubin, Direct: 0.2 mg/dL (ref 0.0–0.3)
Total Bilirubin: 0.7 mg/dL (ref 0.2–1.2)
Total Protein: 6.1 g/dL (ref 6.0–8.3)

## 2024-08-11 LAB — MICROALBUMIN / CREATININE URINE RATIO
Creatinine,U: 81.4 mg/dL
Microalb Creat Ratio: UNDETERMINED mg/g (ref 0.0–30.0)
Microalb, Ur: <0.7 mg/dL

## 2024-08-11 LAB — LIPASE: Lipase: 21 U/L (ref 11.0–59.0)

## 2024-08-12 ENCOUNTER — Telehealth: Admitting: Internal Medicine

## 2024-08-12 ENCOUNTER — Encounter: Payer: Self-pay | Admitting: Internal Medicine

## 2024-08-12 ENCOUNTER — Other Ambulatory Visit: Payer: Self-pay | Admitting: Internal Medicine

## 2024-08-12 ENCOUNTER — Ambulatory Visit: Payer: Self-pay | Admitting: Internal Medicine

## 2024-08-12 VITALS — BP 112/60 | HR 72 | Ht 61.0 in | Wt 155.0 lb

## 2024-08-12 DIAGNOSIS — T466X5A Adverse effect of antihyperlipidemic and antiarteriosclerotic drugs, initial encounter: Secondary | ICD-10-CM

## 2024-08-12 DIAGNOSIS — U071 COVID-19: Secondary | ICD-10-CM | POA: Diagnosis not present

## 2024-08-12 DIAGNOSIS — R748 Abnormal levels of other serum enzymes: Secondary | ICD-10-CM

## 2024-08-12 DIAGNOSIS — T50905A Adverse effect of unspecified drugs, medicaments and biological substances, initial encounter: Secondary | ICD-10-CM

## 2024-08-12 DIAGNOSIS — R945 Abnormal results of liver function studies: Secondary | ICD-10-CM | POA: Diagnosis not present

## 2024-08-12 NOTE — Assessment & Plan Note (Signed)
 RUQ ultrasound was negative for stones,  sludge and CBD dilatiion .    RUQ sympomts are veyr atypical.  Advised to stop atorvastatin  and return in 7 days for labs including workup for transaminitis

## 2024-08-12 NOTE — Assessment & Plan Note (Signed)
 Day 10,  symptoms improving.

## 2024-08-12 NOTE — Addendum Note (Signed)
 Addended by: MARYLYNN VERNEITA CROME on: 08/12/2024 08:05 AM   Modules accepted: Orders

## 2024-08-12 NOTE — Progress Notes (Signed)
 Virtual Visit via Caregility   Note   This format is felt to be most appropriate for this patient at this time.  All issues noted in this document were discussed and addressed.  No physical exam was performed (except for noted visual exam findings with Video Visits).   I connected with Michele Fernandez  on  at  9:30 AM EDT by a video enabled telemedicine application and verified that I am speaking with the correct person using two identifiers. Location patient: home Location provider: work or home office Persons participating in the virtual visit: patient, provider  I discussed the limitations, risks, security and privacy concerns of performing an evaluation and management service by telephone and the availability of in person appointments. I also discussed with the patient that there may be a patient responsible charge related to this service. The patient expressed understanding and agreed to proceed.  Reason for visit: RUQ SYMPTOMS  ELEVATED LFTS. 2) RECOVERING FROM COVID INFECTION   HPI:  Michele Fernandez is a healthy 78 yr old female who presents for follow up on elevated liver ezymes (alk phos,  ALT. AST) in the setting of 1) statin use and 2) recent COVID infection  Day 10 of COVID:  symptoms currently limited to cough and clear rhinirrhea/sputum.  Had anorexia,  anosima/dysgeusia without nausea or diarrhea  has  lost 4-5 lbs. Denies  RUQ pain lasted several seconds  occurring 4 times daily, not related to eating. .  No nausea   ROS: See pertinent positives and negatives per HPI.  Past Medical History:  Diagnosis Date   Allergy ?/ 2005   Biaxin; Demerol (IV) Covid vaccine   Cancer (HCC)    skin   GERD (gastroesophageal reflux disease)    Hyperlipidemia February 2017?   Hypertension    Other sign and symptom in breast     Past Surgical History:  Procedure Laterality Date   BREAST BIOPSY Bilateral 1993   BREAST BIOPSY Left 1989   CATARACT EXTRACTION W/PHACO Left 09/13/2015   Procedure:  CATARACT EXTRACTION PHACO AND INTRAOCULAR LENS PLACEMENT (IOC);  Surgeon: Steven Dingeldein, MD;  Location: [facility] ORS;  Service: Ophthalmology;  Laterality: Left;  Onu#[id number] H US :01:22.4 AP%:26.1 CDE:37.12   CEREBRAL ANEURYSM REPAIR  1991   COLONOSCOPY  2014   [facility], benign colonic mucosa.   COSMETIC SURGERY  2007?   face   EYE SURGERY Right 11/2014   FOOT SURGERY  2009, 2012   HAMMER TOE SURGERY  2006   UPPER GI ENDOSCOPY  2006    Family History  Problem Relation Age of Onset   Alcohol abuse Son    Cancer Maternal Aunt        ovarian   Heart attack Maternal Grandmother    Cancer Brother        lung ca,   sepsis pneumonia   Arthritis Mother    Hearing loss Mother    Varicose Veins Mother    Cancer Mother        ovarian and breast   Early death Father    Alcohol abuse Son    Depression Son    Alcohol abuse Brother    COPD Brother    Stroke Brother    Cancer Maternal Aunt    Cancer Brother    Heart disease Brother    Hypertension Brother    Alcohol abuse Son    Alcohol abuse Brother    Cancer Brother    COPD Brother    Heart disease Brother  Hypertension Brother    Stroke Brother    Cancer Maternal Aunt     SOCIAL HX:  reports that she quit smoking about 51 years ago. Her smoking use included cigarettes. She started smoking about 66 years ago. She has a 15 pack-year smoking history. She has never used smokeless tobacco. She reports current alcohol use. She reports that she does not use drugs.    Current Outpatient Medications:    atorvastatin  (LIPITOR) 40 MG tablet, TAKE 1 TABLET BY MOUTH DAILY., Disp: 90 tablet, Rfl: 1   Cholecalciferol (VITAMIN D3) 2000 units TABS, Take 1 tablet by mouth daily., Disp: , Rfl:    famotidine  (PEPCID ) 40 MG tablet, Take 1 tablet (40 mg total) by mouth daily., Disp: 90 tablet, Rfl: 1   hydrochlorothiazide  (MICROZIDE ) 12.5 MG capsule, Take 1 capsule (12.5 mg total) by mouth daily., Disp: 30 capsule, Rfl: 5   losartan   (COZAAR ) 50 MG tablet, Take 1 tablet (50 mg total) by mouth daily., Disp: 30 tablet, Rfl: 5   Magnesium  250 MG TABS, 1 tablet with a meal, Disp: , Rfl:    ondansetron  (ZOFRAN -ODT) 4 MG disintegrating tablet, Take 1 tablet (4 mg total) by mouth every 8 (eight) hours as needed for nausea or vomiting., Disp: 20 tablet, Rfl: 0   phenol (CHLORASEPTIC) 1.4 % LIQD, Use as directed 1 spray in the mouth or throat as needed for throat irritation / pain., Disp: 177 mL, Rfl: 0   Probiotic Product (ALIGN) 4 MG CAPS, Take 1 capsule by mouth 3 (three) times a week., Disp: , Rfl:   EXAM:  VITALS per patient if applicable:  GENERAL: alert, oriented, appears well and in no acute distress  HEENT: atraumatic, conjunttiva clear, no obvious abnormalities on inspection of external nose and ears  NECK: normal movements of the head and neck  LUNGS: on inspection no signs of respiratory distress, breathing rate appears normal, no obvious gross SOB, gasping or wheezing  CV: no obvious cyanosis  MS: moves all visible extremities without noticeable abnormality  PSYCH/NEURO: pleasant and cooperative, no obvious depression or anxiety, speech and thought processing grossly intact  ASSESSMENT AND PLAN: Abnormal liver function Assessment & Plan: RUQ ultrasound was negative for stones,  sludge and CBD dilatiion .    RUQ sympomts are veyr atypical.  Advised to stop atorvastatin  and return in 7 days for labs including workup for transaminitis    COVID-19 virus infection Assessment & Plan: Day 10,  symptoms improving.        I discussed the assessment and treatment plan with the patient. The patient was provided an opportunity to ask questions and all were answered. The patient agreed with the plan and demonstrated an understanding of the instructions.   The patient was advised to call back or seek an in-person evaluation if the symptoms worsen or if the condition fails to improve as anticipated.   I spent 30  minutes dedicated to the care of this patient on the date of this encounter to include pre-visit review of patient's medical history,  including recent ER visit, imaging studies and labs, face-to-face time with the patient , and post visit ordering of testing and therapeutics.    Verneita LITTIE Kettering, MD

## 2024-08-18 ENCOUNTER — Other Ambulatory Visit (INDEPENDENT_AMBULATORY_CARE_PROVIDER_SITE_OTHER)

## 2024-08-18 DIAGNOSIS — R945 Abnormal results of liver function studies: Secondary | ICD-10-CM | POA: Diagnosis not present

## 2024-08-18 LAB — HEPATIC FUNCTION PANEL
ALT: 47 U/L — ABNORMAL HIGH (ref 0–35)
AST: 28 U/L (ref 0–37)
Albumin: 4.1 g/dL (ref 3.5–5.2)
Alkaline Phosphatase: 152 U/L — ABNORMAL HIGH (ref 39–117)
Bilirubin, Direct: 0.2 mg/dL (ref 0.0–0.3)
Total Bilirubin: 0.7 mg/dL (ref 0.2–1.2)
Total Protein: 6.3 g/dL (ref 6.0–8.3)

## 2024-08-18 LAB — IBC + FERRITIN
Ferritin: 91.6 ng/mL (ref 10.0–291.0)
Iron: 117 ug/dL (ref 42–145)
Saturation Ratios: 34 % (ref 20.0–50.0)
TIBC: 344.4 ug/dL (ref 250.0–450.0)
Transferrin: 246 mg/dL (ref 212.0–360.0)

## 2024-08-21 ENCOUNTER — Other Ambulatory Visit

## 2024-08-22 LAB — ANTI-NUCLEAR AB-TITER (ANA TITER)
ANA TITER: 1:80 {titer} — ABNORMAL HIGH
ANA Titer 1: 1:320 {titer} — ABNORMAL HIGH

## 2024-08-22 LAB — ANTI-SMOOTH MUSCLE ANTIBODY, IGG: Actin (Smooth Muscle) Antibody (IGG): <20 U (ref <20)

## 2024-08-22 LAB — HEPATITIS B SURFACE ANTIBODY,QUALITATIVE: Hep B S Ab: REACTIVE — AB

## 2024-08-22 LAB — CERULOPLASMIN: Ceruloplasmin: 26 mg/dL (ref 14–48)

## 2024-08-22 LAB — ANTI-MICROSOMAL ANTIBODY LIVER / KIDNEY: LKM1 Ab: <=20 U

## 2024-08-22 LAB — ANA: Anti Nuclear Antibody (ANA): POSITIVE — AB

## 2024-08-22 LAB — HEPATITIS C ANTIBODY: Hepatitis C Ab: NONREACTIVE

## 2024-08-22 LAB — HEPATITIS B SURFACE ANTIGEN: Hepatitis B Surface Ag: NONREACTIVE

## 2024-09-08 ENCOUNTER — Other Ambulatory Visit

## 2024-09-08 DIAGNOSIS — K716 Toxic liver disease with hepatitis, not elsewhere classified: Secondary | ICD-10-CM | POA: Diagnosis not present

## 2024-09-08 DIAGNOSIS — T50905A Adverse effect of unspecified drugs, medicaments and biological substances, initial encounter: Secondary | ICD-10-CM

## 2024-09-09 ENCOUNTER — Ambulatory Visit: Payer: Self-pay | Admitting: Internal Medicine

## 2024-09-09 DIAGNOSIS — T466X5A Adverse effect of antihyperlipidemic and antiarteriosclerotic drugs, initial encounter: Secondary | ICD-10-CM | POA: Insufficient documentation

## 2024-09-09 LAB — HEPATIC FUNCTION PANEL
ALT: 37 U/L — ABNORMAL HIGH (ref 0–35)
AST: 28 U/L (ref 0–37)
Albumin: 4.1 g/dL (ref 3.5–5.2)
Alkaline Phosphatase: 97 U/L (ref 39–117)
Bilirubin, Direct: 0.1 mg/dL (ref 0.0–0.3)
Total Bilirubin: 0.6 mg/dL (ref 0.2–1.2)
Total Protein: 6.5 g/dL (ref 6.0–8.3)

## 2024-09-09 LAB — ANA W/REFLEX IF POSITIVE: Anti Nuclear Antibody (ANA): NEGATIVE

## 2024-10-03 ENCOUNTER — Encounter: Payer: Self-pay | Admitting: Internal Medicine

## 2024-10-05 ENCOUNTER — Other Ambulatory Visit: Payer: Self-pay | Admitting: Internal Medicine

## 2024-10-05 DIAGNOSIS — R945 Abnormal results of liver function studies: Secondary | ICD-10-CM

## 2024-10-05 DIAGNOSIS — R1011 Right upper quadrant pain: Secondary | ICD-10-CM

## 2024-10-09 ENCOUNTER — Encounter: Payer: Self-pay | Admitting: Internal Medicine

## 2024-10-16 ENCOUNTER — Ambulatory Visit
Admission: RE | Admit: 2024-10-16 | Discharge: 2024-10-16 | Disposition: A | Discharge status: Home / Self Care | Source: Ambulatory Visit | Attending: Internal Medicine | Admitting: Internal Medicine

## 2024-10-16 DIAGNOSIS — R945 Abnormal results of liver function studies: Secondary | ICD-10-CM | POA: Diagnosis present

## 2024-10-16 DIAGNOSIS — R1011 Right upper quadrant pain: Secondary | ICD-10-CM | POA: Insufficient documentation

## 2024-10-16 MED ORDER — IOHEXOL 300 MG/ML  SOLN
100.0000 mL | Freq: Once | INTRAMUSCULAR | Status: AC | PRN
  Administered 2024-10-16: 100 mL via INTRAVENOUS

## 2024-10-18 ENCOUNTER — Ambulatory Visit: Payer: Self-pay | Admitting: Internal Medicine

## 2024-10-20 LAB — HM MAMMOGRAPHY

## 2024-10-21 ENCOUNTER — Encounter: Payer: Self-pay | Admitting: Internal Medicine

## 2025-02-05 ENCOUNTER — Encounter: Admitting: Internal Medicine
